# Patient Record
Sex: Male | Born: 1960
Health system: Southern US, Community
[De-identification: ages and names within clinical notes are randomized; demographics above are authoritative.]

## PROBLEM LIST (undated history)

## (undated) DIAGNOSIS — Z720 Tobacco use: Secondary | ICD-10-CM

## (undated) DIAGNOSIS — I2510 Atherosclerotic heart disease of native coronary artery without angina pectoris: Secondary | ICD-10-CM

## (undated) DIAGNOSIS — K219 Gastro-esophageal reflux disease without esophagitis: Secondary | ICD-10-CM

## (undated) DIAGNOSIS — I119 Hypertensive heart disease without heart failure: Secondary | ICD-10-CM

## (undated) HISTORY — PX: BACK SURGERY: SHX140

## (undated) HISTORY — PX: LUMBAR DISC SURGERY: SHX700

---

## 2008-04-15 ENCOUNTER — Ambulatory Visit (HOSPITAL_COMMUNITY): Admission: RE | Admit: 2008-04-15 | Discharge: 2008-04-15 | Payer: Self-pay | Admitting: Internal Medicine

## 2015-08-03 ENCOUNTER — Ambulatory Visit (INDEPENDENT_AMBULATORY_CARE_PROVIDER_SITE_OTHER): Payer: 59 | Admitting: Internal Medicine

## 2015-08-03 ENCOUNTER — Encounter: Payer: Self-pay | Admitting: Cardiology

## 2015-08-03 ENCOUNTER — Encounter: Payer: Self-pay | Admitting: Internal Medicine

## 2015-08-03 VITALS — BP 152/90 | HR 64 | Temp 97.7°F | Resp 16 | Ht 76.0 in | Wt 188.4 lb

## 2015-08-03 DIAGNOSIS — R079 Chest pain, unspecified: Secondary | ICD-10-CM | POA: Diagnosis not present

## 2015-08-03 DIAGNOSIS — Z79899 Other long term (current) drug therapy: Secondary | ICD-10-CM | POA: Diagnosis not present

## 2015-08-03 DIAGNOSIS — K219 Gastro-esophageal reflux disease without esophagitis: Secondary | ICD-10-CM

## 2015-08-03 DIAGNOSIS — R7303 Prediabetes: Secondary | ICD-10-CM | POA: Diagnosis not present

## 2015-08-03 DIAGNOSIS — I10 Essential (primary) hypertension: Secondary | ICD-10-CM | POA: Diagnosis not present

## 2015-08-03 DIAGNOSIS — E559 Vitamin D deficiency, unspecified: Secondary | ICD-10-CM

## 2015-08-03 DIAGNOSIS — E785 Hyperlipidemia, unspecified: Secondary | ICD-10-CM | POA: Diagnosis not present

## 2015-08-03 DIAGNOSIS — Z136 Encounter for screening for cardiovascular disorders: Secondary | ICD-10-CM

## 2015-08-03 MED ORDER — ATENOLOL 50 MG PO TABS: ORAL_TABLET | ORAL | Status: DC

## 2015-08-03 MED ORDER — NITROGLYCERIN 0.4 MG SL SUBL: 0.4000 mg | SUBLINGUAL_TABLET | SUBLINGUAL | Status: DC | PRN

## 2015-08-03 MED ORDER — ESOMEPRAZOLE MAGNESIUM 40 MG PO CPDR: DELAYED_RELEASE_CAPSULE | ORAL | Status: DC

## 2015-08-03 MED ORDER — PANTOPRAZOLE SODIUM 40 MG PO TBEC: DELAYED_RELEASE_TABLET | ORAL | Status: DC

## 2015-08-03 NOTE — Progress Notes (Signed)
  Subjective:    Patient ID: Corey Collier, male    DOB: 12-07-1960, 55 y.o.   MRN: [id number]  HPI  This nice 55 yo DWM who has been lost to f/u >5+ years returned today for evaluation of CP. 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 He has several concerns including occasional EG indigestion esp w/ recumbency and not relieved with Tums or antacids. Has not tried h2 blockers or PPI's. Also reports last week mowing with a self propelled mower he developed several episodes mid chest discomfort and SOB which prompted him to stop & rest. Denied associated N/V, diaphoresis. Today in his office, he developed aching in his shoulders lifting boxes. He also relates other episodes of aching in his shoulders w/o associated physical activity and had no associated CP,SOB,  N or diaphoresis. He's had no sx's walking at a regular pace. Denies hx/o HTN.   Meds- none  All - None  PMH - Neg   Surg - None   FH- (+) for CVA in [place], Alc Cirrhosis in a deceased brother, colon cancer in another brother and 5 sisters in good health.    Review of Systems  .nedros    Objective:   Physical Exam  BP 152/90 mmHg  Pulse 64  Temp(Src) 97.7 F (36.5 C)  Resp 16  Ht 6\' 4"  (1.93 m)  Wt 188 lb 6.4 oz (85.458 kg)  BMI 22.94 kg/m2  HEENT - Eac's patent. TM's Nl. EOM's full. PERRLA. NasoOroPharynx clear. Neck - supple. Nl Thyroid. Carotids 2+ & No bruits, nodes, JVD Chest - Clear equal BS w/o Rales, rhonchi, wheezes. Cor - Nl HS. RRR w/o sig MGR. PP 1(+). No edema. Abd - No palpable organomegaly, masses or tenderness. BS nl. MS- FROM w/o deformities. Muscle power, tone and bulk Nl. Gait Nl. Neuro - No obvious Cr N abnormalities. Sensory, motor and Cerebellar functions appear Nl w/o focal abnormalities. Psyche - Mental status normal & appropriate.  No delusions, ideations or obvious mood abnormalities.  EKG - NSR - No  scute changes.     Assessment & Plan:   1. Essential hypertension  - Atenolol 50 mg #90 x 1 rf  - EKG 12-Lead - TSH  2. Chest pain, unspecified   - EKG 12-Lead - Troponin I - NITROSTAT 0.4 MG SL tab; Place 1 tablet ) under the tongue every 5  minutes as needed for chest pain. - Ambulatory referral to Cardiology  3. Gastroesophageal reflux disease  - Protonix 40 mg # 30 x 1 rf  4. Hyperlipidemia  - Lipid panel  5. Prediabetes  - Hemoglobin A1c - Insulin, random  6. Vitamin D deficiency  - VITAMIN D 25 Hydroxy   7. Medication management  - CBC with Differential/Platelet - BASIC METABOLIC PANEL WITH GFR - Hepatic function panel - Magnesium   - Discussed with patient possibility of angina and reviewed in detail sx's as well as instructed in use of TNG. Collecting lab data base to establish baseline. Requesting Cardiology consult. Advised patient of sx's to call 911 for. Over 40 minutes of exam, counseling, chart review and high complex critical decision making was performed. Over 30 minutes of exam, counseling, chart review and high complex critical decision making was performed

## 2015-08-03 NOTE — Patient Instructions (Signed)
Angina Pectoris Angina pectoris, often called angina, is extreme discomfort in the chest, neck, or arm. This is caused by a lack of blood in the middle and thickest layer of the heart wall (myocardium). There are four types of angina:  Stable angina. Stable angina usually occurs in episodes of predictable frequency and duration. It is usually brought on by physical activity, stress, or excitement. Stable angina usually lasts a few minutes and can often be relieved by a medicine that you place under your tongue. This medicine is called sublingual nitroglycerin.  Unstable angina. Unstable angina can occur even when you are doing little or no physical activity. It can even occur while you are sleeping or when you are at rest. It can suddenly increase in severity or frequency. It may not be relieved by sublingual nitroglycerin, and it can last up to 30 minutes.  Microvascular angina. This type of angina is caused by a disorder of tiny blood vessels called arterioles. Microvascular angina is more common in women. The pain may be more severe and last longer than other types of angina pectoris.  Prinzmetal or variant angina. This type of angina pectoris is rare and usually occurs when you are doing little or no physical activity. It especially occurs in the early morning hours. CAUSES Atherosclerosis is the cause of angina. This is the buildup of fat and cholesterol (plaque) on the inside of the arteries. Over time, the plaque may narrow or block the artery, and this will lessen blood flow to the heart. Plaque can also become weak and break off within a coronary artery to form a clot and cause a sudden blockage. RISK FACTORS Risk factors common to both men and women include:  High cholesterol levels.  High blood pressure (hypertension).  Tobacco use.  Diabetes.  Family history of angina.  Obesity.  Lack of exercise.  A diet high in saturated fats. Women are at greater risk for angina if they  are:  Over age 9.  Postmenopausal. SYMPTOMS Many people do not experience any symptoms during the early stages of angina. As the condition progresses, symptoms common to both men and women may include:  Chest pain.  The pain can be described as a crushing or squeezing in the chest, or a tightness, pressure, fullness, or heaviness in the chest.  The pain can last more than a few minutes, or it can stop and recur.  Pain in the arms, neck, jaw, or back.  Unexplained heartburn or indigestion.  Shortness of breath.  Nausea.  Sudden cold sweats.  Sudden light-headedness. Many women have chest discomfort and some of the other symptoms. However, women often have different (atypical) symptoms, such as:   Fatigue.  Unexplained feelings of nervousness or anxiety.  Unexplained weakness.  Dizziness or fainting. Sometimes, women may have angina without any symptoms. DIAGNOSIS  Tests to diagnose angina may include:  ECG (electrocardiogram).  Exercise stress test. This looks for signs of blockage when the heart is being exercised.  Pharmacologic stress test. This test looks for signs of blockage when the heart is being stressed with a medicine.  Blood tests.  Coronary angiogram. This is a procedure to look at the coronary arteries to see if there is any blockage. TREATMENT  The treatment of angina may include the following:  Healthy behavioral changes to reduce or control risk factors.  Medicine.  Coronary stenting.A stent helps to keep an artery open.  Coronary angioplasty. This procedure widens a narrowed or blocked artery.  Coronary arterybypass  surgery. This will allow your blood to pass the blockage (bypass) to reach your heart. HOME CARE INSTRUCTIONS   Take medicines only as directed by your health care provider.  Do not take the following medicines unless your health care provider approves:  Nonsteroidal anti-inflammatory drugs (NSAIDs), such as ibuprofen,  naproxen, or celecoxib.  Vitamin supplements that contain vitamin A, vitamin E, or both.  Hormone replacement therapy that contains estrogen with or without progestin.  Manage other health conditions such as hypertension and diabetes as directed by your health care provider.  Follow a heart-healthy diet. A dietitian can help to educate you about healthy food options and changes.  Use healthy cooking methods such as roasting, grilling, broiling, baking, poaching, steaming, or stir-frying. Talk to a dietitian to learn more about healthy cooking methods.  Follow an exercise program approved by your health care provider.  Maintain a healthy weight. Lose weight as approved by your health care provider.  Plan rest periods when fatigued.  Learn to manage stress.  Do not use any tobacco products, including cigarettes, chewing tobacco, or electronic cigarettes. If you need help quitting, ask your health care provider.  If you drink alcohol, and your health care provider approves, limit your alcohol intake to no more than 1 drink per day. One drink equals 12 ounces of beer, 5 ounces of wine, or 1 ounces of hard liquor.  Stop illegal drug use.  Keep all follow-up visits as directed by your health care provider. This is important. SEEK IMMEDIATE MEDICAL CARE IF:   You have pain in your chest, neck, arm, jaw, stomach, or back that lasts more than a few minutes, is recurring, or is unrelieved by taking sublingualnitroglycerin.  You have profuse sweating without cause.  You have unexplained:  Heartburn or indigestion.  Shortness of breath or difficulty breathing.  Nausea or vomiting.  Fatigue.  Feelings of nervousness or anxiety.  Weakness.  Diarrhea.  You have sudden light-headedness or dizziness.  You faint. These symptoms may represent a serious problem that is an emergency. Do not wait to see if the symptoms will go away. Get medical help right away. Call your local  emergency services (911 in the [place] Do not drive yourself to the hospital.   This information is not intended to replace advice given to you by your health care provider. Make sure you discuss any questions you have with your health care provider.   Document Released: 03/12/2005 Document Revised: 04/02/2014 Document Reviewed: 07/14/2013 Elsevier Interactive Patient Education 2016 Else[employer] ++++++++++++++++++++++++++++++++++++++++++++++++++++++++++++++++++++++++++++++++++++++++++++++++++++++++++++++++++++++ Acute Coronary Syndrome Acute coronary syndrome (ACS) is a serious problem in which there is suddenly not enough blood and oxygen supplied to the heart. ACS may mean that one or more of the blood vessels in your heart (coronary arteries) may be blocked. ACS can result in chest pain or a heart attack (myocardial infarction or MI). CAUSES This condition is caused by atherosclerosis, which is the buildup of fat and cholesterol (plaque) on the inside of the arteries. Over time, the plaque may narrow or block the artery, and this will lessen blood flow to the heart. Plaque can also become weak and break off within a coronary artery to form a clot and cause a sudden blockage. RISK FACTORS The risks factors of this condition include:  High cholesterol levels.  High blood pressure (hypertension).  Smoking.  Diabetes.  Age.  Family history of chest pain, heart disease, or stroke.  Lack of exercise. SYMPTOMS The most common signs of this condition  include:  Chest pain, which can be:  A crushing or squeezing in the chest.  A tightness, pressure, fullness, or heaviness in the chest.  Present for more than a few minutes, or it can stop and recur.  Pain in the arms, neck, jaw, or back.  Unexplained heartburn or indigestion.  Shortness of breath.  Nausea.  Sudden cold sweats.  Feeling light-headed or dizzy. Sometimes, this condition has no symptoms. DIAGNOSIS ACS may be  diagnosed through the following tests:  Electrocardiogram (ECG).  Blood tests.  Coronary angiogram. This is a procedure to look at the coronary arteries to see if there is any blockage. TREATMENT Treatment for ACS may include:  Healthy behavioral changes to reduce or control risk factors.  Medicine.  Coronary stenting.A stent helps to keep an artery open.  Coronary angioplasty. This procedure widens a narrowed or blocked artery.  Coronary artery bypass surgery. This will allow your blood to pass the blockage (bypass) to reach your heart. HOME CARE INSTRUCTIONS Eating and Drinking  Follow a heart-healthy diet. A dietitian can you help to educate you about healthy food options and changes.  Use healthy cooking methods such as roasting, grilling, broiling, baking, poaching, steaming, or stir-frying. Talk to a dietitian to learn more about healthy cooking methods. Medicines  Take medicines only as directed by your health care provider.  Do not take the following medicines unless your health care provider approves:  Nonsteroidal anti-inflammatory drugs (NSAIDs), such as ibuprofen, naproxen, or celecoxib.  Vitamin supplements that contain vitamin A, vitamin E, or both.  Hormone replacement therapy that contains estrogen with or without progestin.  Stop illegal drug use. Activities  Follow an exercise program that is approved by your health care provider.  Plan rest periods when you are fatigued. Lifestyle  Do not use any tobacco products, including cigarettes, chewing tobacco, or electronic cigarettes. If you need help quitting, ask your health care provider.  If you drink alcohol, and your health care provider approves, limit your alcohol intake to no more than 1 drink per day. One drink equals 12 ounces of beer, 5 ounces of wine, or 1 ounces of hard liquor.  Learn to manage stress.  Maintain a healthy weight. Lose weight as approved by your health care  provider. General Instructions  Manage other health conditions, such as hypertension and diabetes, as directed by your health care provider.  Keep all follow-up visits as directed by your health care provider. This is important.  Your health care provider may ask you to monitor your blood pressure. A blood pressure reading consists of a higher number over a lower number, such as 110 over 72, written as 110/72. Ideally, your blood pressure should be:  Below 140/90 if you have no other medical conditions.  Below 130/80 if you have diabetes or kidney disease. SEEK IMMEDIATE MEDICAL CARE IF:  You have pain in your chest, neck, arm, jaw, stomach, or back that lasts more than a few minutes, is recurring, or is not relieved by taking medicine under your tongue (sublingual nitroglycerin).  You have profuse sweating without cause.  You have unexplained:  Heartburn or indigestion.  Shortness of breath or difficulty breathing.  Nausea or vomiting.  Fatigue.  Feelings of nervousness or anxiety.  Weakness.  Diarrhea.  You have sudden light-headedness or dizziness.  You faint. These symptoms may represent a serious problem that is an emergency. Do not wait to see if the symptoms will go away. Get medical help right away. Call your local  emergency services (911 in the [place] Do not drive yourself to the clinic or hospital.   +++++++++++++++++++++++++++++++++++++++++++++++++++++++++++++++++++++++++++++++++++++++++++++++ Gastroesophageal Reflux Disease, Adult Normally, food travels down the esophagus and stays in the stomach to be digested. However, when a person has gastroesophageal reflux disease (GERD), food and stomach acid move back up into the esophagus. When this happens, the esophagus becomes sore and inflamed. Over time, GERD can create small holes (ulcers) in the lining of the esophagus.  CAUSES This condition is caused by a problem with the muscle between the esophagus and the  stomach (lower esophageal sphincter, or LES). Normally, the LES muscle closes after food passes through the esophagus to the stomach. When the LES is weakened or abnormal, it does not close properly, and that allows food and stomach acid to go back up into the esophagus. The LES can be weakened by certain dietary substances, medicines, and medical conditions, including:  Tobacco use.  Pregnancy.  Having a hiatal hernia.  Heavy alcohol use.  Certain foods and beverages, such as coffee, chocolate, onions, and peppermint. RISK FACTORS This condition is more likely to develop in:  People who have an increased body weight.  People who have connective tissue disorders.  People who use NSAID medicines. SYMPTOMS Symptoms of this condition include:  Heartburn.  Difficult or painful swallowing.  The feeling of having a lump in the throat.  Abitter taste in the mouth.  Bad breath.  Having a large amount of saliva.  Having an upset or bloated stomach.  Belching.  Chest pain.  Shortness of breath or wheezing.  Ongoing (chronic) cough or a night-time cough.  Wearing away of tooth enamel.  Weight loss. Different conditions can cause chest pain. Make sure to see your health care provider if you experience chest pain. DIAGNOSIS Your health care provider will take a medical history and perform a physical exam. To determine if you have mild or severe GERD, your health care provider may also monitor how you respond to treatment. You may also have other tests, including:  An endoscopy toexamine your stomach and esophagus with a small camera.  A test thatmeasures the acidity level in your esophagus.  A test thatmeasures how much pressure is on your esophagus.  A barium swallow or modified barium swallow to show the shape, size, and functioning of your esophagus. TREATMENT The goal of treatment is to help relieve your symptoms and to prevent complications. Treatment for this  condition may vary depending on how severe your symptoms are. Your health care provider may recommend:  Changes to your diet.  Medicine.  Surgery. HOME CARE INSTRUCTIONS Diet  Follow a diet as recommended by your health care provider. This may involve avoiding foods and drinks such as:  Coffee and tea (with or without caffeine).  Drinks that containalcohol.  Energy drinks and sports drinks.  Carbonated drinks or sodas.  Chocolate and cocoa.  Peppermint and mint flavorings.  Garlic and onions.  Horseradish.  Spicy and acidic foods, including peppers, chili powder, curry powder, vinegar, hot sauces, and barbecue sauce.  Citrus fruit juices and citrus fruits, such as oranges, lemons, and limes.  Tomato-based foods, such as red sauce, chili, salsa, and pizza with red sauce.  Fried and fatty foods, such as donuts, french fries, potato chips, and high-fat dressings.  High-fat meats, such as hot dogs and fatty cuts of red and white meats, such as rib eye steak, sausage, ham, and bacon.  High-fat dairy items, such as whole milk, butter, and cream  cheese.  Eat small, frequent meals instead of large meals.  Avoid drinking large amounts of liquid with your meals.  Avoid eating meals during the 2-3 hours before bedtime.  Avoid lying down right after you eat.  Do not exercise right after you eat. General Instructions  Pay attention to any changes in your symptoms.  Take over-the-counter and prescription medicines only as told by your health care provider. Do not take aspirin, ibuprofen, or other NSAIDs unless your health care provider told you to do so.  Do not use any tobacco products, including cigarettes, chewing tobacco, and e-cigarettes. If you need help quitting, ask your health care provider.  Wear loose-fitting clothing. Do not wear anything tight around your waist that causes pressure on your abdomen.  Raise (elevate) the head of your bed 6 inches  (15cm).  Try to reduce your stress, such as with yoga or meditation. If you need help reducing stress, ask your health care provider.  If you are overweight, reduce your weight to an amount that is healthy for you. Ask your health care provider for guidance about a safe weight loss goal.  Keep all follow-up visits as told by your health care provider. This is important. SEEK MEDICAL CARE IF:  You have new symptoms.  You have unexplained weight loss.  You have difficulty swallowing, or it hurts to swallow.  You have wheezing or a persistent cough.  Your symptoms do not improve with treatment.  You have a hoarse voice. SEEK IMMEDIATE MEDICAL CARE IF:  You have pain in your arms, neck, jaw, teeth, or back.  You feel sweaty, dizzy, or light-headed.  You have chest pain or shortness of breath.  You vomit and your vomit looks like blood or coffee grounds.  You faint.  Your stool is bloody or black.  You cannot swallow, drink, or eat.   This information is not intended to replace advice given to you by your health care provider. Make sure you discuss any questions you have with your health care provider.   Document Released: 12/20/2004 Document Revised: 12/01/2014 Document Reviewed: 07/07/2014 Elsevier Interactive Patient Education 2016 [employer]. ++++++++++++++++++++++++++++++++++++++++++++++++++++++++++++++++++++++++++++++++++++++++ Food Choices for Gastroesophageal Reflux Disease, Adult When you have gastroesophageal reflux disease (GERD), the foods you eat and your eating habits are very important. Choosing the right foods can help ease the discomfort of GERD. WHAT GENERAL GUIDELINES DO I NEED TO FOLLOW?  Choose fruits, vegetables, whole grains, low-fat dairy products, and low-fat meat, fish, and poultry.  Limit fats such as oils, salad dressings, butter, nuts, and avocado.  Keep a food diary to identify foods that cause symptoms.  Avoid foods that cause reflux.  These may be different for different people.  Eat frequent small meals instead of three large meals each day.  Eat your meals slowly, in a relaxed setting.  Limit fried foods.  Cook foods using methods other than frying.  Avoid drinking alcohol.  Avoid drinking large amounts of liquids with your meals.  Avoid bending over or lying down until 2-3 hours after eating. WHAT FOODS ARE NOT RECOMMENDED? The following are some foods and drinks that may worsen your symptoms: Vegetables Tomatoes. Tomato juice. Tomato and spaghetti sauce. Chili peppers. Onion and garlic. Horseradish. Fruits Oranges, grapefruit, and lemon (fruit and juice). Meats High-fat meats, fish, and poultry. This includes hot dogs, ribs, ham, sausage, salami, and bacon. Dairy Whole milk and chocolate milk. Sour cream. Cream. Butter. Ice cream. Cream cheese.  Beverages Coffee and tea, with or without  caffeine. Carbonated beverages or energy drinks. Condiments Hot sauce. Barbecue sauce.  Sweets/Desserts Chocolate and cocoa. Donuts. Peppermint and spearmint. Fats and Oils High-fat foods, including [place] fries and potato chips. Other Vinegar. Strong spices, such as black pepper, white pepper, red pepper, cayenne, curry powder, cloves, ginger, and chili powder. The items listed above may not be a complete list of foods and beverages to avoid. Contact your dietitian for more information.   This information is not intended to replace advice given to you by your health care provider. Make sure you discuss any questions you have with your health care provider.   Document Released: 03/12/2005 Document Revised: 04/02/2014 Document Reviewed: 01/14/2013 Elsevier Interactive Patient Education [employer].

## 2015-08-03 NOTE — Progress Notes (Deleted)
Patient ID: Corey Collier, male   DOB: 12/21/1960, 55 y.o.   MRN: [id number]

## 2015-08-04 ENCOUNTER — Telehealth: Payer: Self-pay | Admitting: *Deleted

## 2015-08-04 LAB — CBC WITH DIFFERENTIAL/PLATELET
BASOS PCT: 1 %
Basophils Absolute: 59 cells/uL (ref 0–200)
EOS PCT: 4 %
Eosinophils Absolute: 236 cells/uL (ref 15–500)
HCT: 46.4 % (ref 38.5–50.0)
Hemoglobin: 15.7 g/dL (ref 13.2–17.1)
Lymphocytes Relative: 40 %
Lymphs Abs: 2360 cells/uL (ref 850–3900)
MCH: 31.8 pg (ref 27.0–33.0)
MCHC: 33.8 g/dL (ref 32.0–36.0)
MCV: 94.1 fL (ref 80.0–100.0)
MONOS PCT: 7 %
MPV: 10.4 fL (ref 7.5–12.5)
Monocytes Absolute: 413 cells/uL (ref 200–950)
NEUTROS ABS: 2832 {cells}/uL (ref 1500–7800)
Neutrophils Relative %: 48 %
PLATELETS: 207 10*3/uL (ref 140–400)
RBC: 4.93 MIL/uL (ref 4.20–5.80)
RDW: 13.7 % (ref 11.0–15.0)
WBC: 5.9 10*3/uL (ref 3.8–10.8)

## 2015-08-04 LAB — BASIC METABOLIC PANEL WITH GFR
BUN: 11 mg/dL (ref 7–25)
CHLORIDE: 103 mmol/L (ref 98–110)
CO2: 27 mmol/L (ref 20–31)
CREATININE: 0.96 mg/dL (ref 0.70–1.33)
Calcium: 9 mg/dL (ref 8.6–10.3)
GFR, Est African American: >89 mL/min (ref >=60)
GFR, Est Non African American: 89 mL/min (ref >=60)
Glucose, Bld: 87 mg/dL (ref 65–99)
POTASSIUM: 4.4 mmol/L (ref 3.5–5.3)
SODIUM: 137 mmol/L (ref 135–146)

## 2015-08-04 LAB — HEMOGLOBIN A1C
Hgb A1c MFr Bld: 5.5 % (ref <5.7)
Mean Plasma Glucose: 111 mg/dL

## 2015-08-04 LAB — LIPID PANEL
CHOL/HDL RATIO: 2 ratio (ref <=5.0)
Cholesterol: 171 mg/dL (ref 125–200)
HDL: 86 mg/dL (ref >=40)
LDL Cholesterol: 67 mg/dL (ref <130)
Triglycerides: 92 mg/dL (ref <150)
VLDL: 18 mg/dL (ref <30)

## 2015-08-04 LAB — VITAMIN D 25 HYDROXY (VIT D DEFICIENCY, FRACTURES): VIT D 25 HYDROXY: 46 ng/mL (ref 30–100)

## 2015-08-04 LAB — HEPATIC FUNCTION PANEL
ALT: 27 U/L (ref 9–46)
AST: 21 U/L (ref 10–35)
Albumin: 4.4 g/dL (ref 3.6–5.1)
Alkaline Phosphatase: 62 U/L (ref 40–115)
BILIRUBIN DIRECT: 0.1 mg/dL (ref <=0.2)
BILIRUBIN TOTAL: 0.6 mg/dL (ref 0.2–1.2)
Indirect Bilirubin: 0.5 mg/dL (ref 0.2–1.2)
Total Protein: 6.8 g/dL (ref 6.1–8.1)

## 2015-08-04 LAB — TSH: TSH: 0.9 m[IU]/L (ref 0.40–4.50)

## 2015-08-04 LAB — TROPONIN I: TROPONIN I: 0.01 ng/mL (ref <=0.05)

## 2015-08-04 LAB — MAGNESIUM: MAGNESIUM: 2.2 mg/dL (ref 1.5–2.5)

## 2015-08-04 LAB — INSULIN, RANDOM: Insulin: 3.6 u[IU]/mL (ref 2.0–19.6)

## 2015-08-04 NOTE — Telephone Encounter (Signed)
Patient was advised of an appointment with Dr Marlou Porch, cardiologist, for suspension new onset angina on 08/09/2015 at [facility] .

## 2015-08-05 ENCOUNTER — Telehealth: Payer: Self-pay | Admitting: *Deleted

## 2015-08-05 ENCOUNTER — Encounter: Payer: Self-pay | Admitting: *Deleted

## 2015-08-05 NOTE — Telephone Encounter (Signed)
Called patient to get family history and status. He doesn't know much about his family history. He is also thinking that he may need to reschedule his appointment depending on this weekend.

## 2015-08-08 ENCOUNTER — Telehealth: Payer: Self-pay | Admitting: *Deleted

## 2015-08-08 NOTE — Telephone Encounter (Signed)
Patient called and reported the Atenolol 50 mg tablet is causing him to feel very tired.  Per Dr Melford Aase, the patient should take 1/2 tablet(25 mg) until his cardiology appointment on 08/09/2015.  Patient is aware.

## 2015-08-09 ENCOUNTER — Encounter: Payer: Self-pay | Admitting: Cardiology

## 2015-08-09 ENCOUNTER — Ambulatory Visit (INDEPENDENT_AMBULATORY_CARE_PROVIDER_SITE_OTHER): Payer: 59 | Admitting: Cardiology

## 2015-08-09 ENCOUNTER — Encounter: Payer: Self-pay | Admitting: *Deleted

## 2015-08-09 VITALS — BP 132/84 | HR 54 | Ht 76.0 in | Wt 190.4 lb

## 2015-08-09 DIAGNOSIS — R079 Chest pain, unspecified: Secondary | ICD-10-CM | POA: Diagnosis not present

## 2015-08-09 DIAGNOSIS — Z01818 Encounter for other preprocedural examination: Secondary | ICD-10-CM

## 2015-08-09 DIAGNOSIS — Z720 Tobacco use: Secondary | ICD-10-CM | POA: Diagnosis not present

## 2015-08-09 DIAGNOSIS — K219 Gastro-esophageal reflux disease without esophagitis: Secondary | ICD-10-CM

## 2015-08-09 LAB — PROTIME-INR
INR: 1.04 (ref <1.50)
Prothrombin Time: 13.7 seconds (ref 11.6–15.2)

## 2015-08-09 LAB — CBC
HEMATOCRIT: 44.7 % (ref 38.5–50.0)
HEMOGLOBIN: 15.2 g/dL (ref 13.2–17.1)
MCH: 32 pg (ref 27.0–33.0)
MCHC: 34 g/dL (ref 32.0–36.0)
MCV: 94.1 fL (ref 80.0–100.0)
MPV: 10.7 fL (ref 7.5–12.5)
Platelets: 204 10*3/uL (ref 140–400)
RBC: 4.75 MIL/uL (ref 4.20–5.80)
RDW: 13.7 % (ref 11.0–15.0)
WBC: 6.5 10*3/uL (ref 3.8–10.8)

## 2015-08-09 LAB — BASIC METABOLIC PANEL
BUN: 14 mg/dL (ref 7–25)
CO2: 26 mmol/L (ref 20–31)
Calcium: 9.6 mg/dL (ref 8.6–10.3)
Chloride: 104 mmol/L (ref 98–110)
Creat: 1.12 mg/dL (ref 0.70–1.33)
Glucose, Bld: 91 mg/dL (ref 65–99)
POTASSIUM: 4.3 mmol/L (ref 3.5–5.3)
SODIUM: 139 mmol/L (ref 135–146)

## 2015-08-09 MED ORDER — ASPIRIN EC 81 MG PO TBEC: 81.0000 mg | DELAYED_RELEASE_TABLET | Freq: Every day | ORAL | Status: DC

## 2015-08-09 NOTE — Addendum Note (Signed)
Addended by: Candee Furbish C on: 08/09/2015 03:55 PM   Modules accepted: Orders

## 2015-08-09 NOTE — Progress Notes (Signed)
Cardiology Office Note    Date:  08/09/2015   ID:  Corey Collier, DOB Dec 30, 1960, MRN [id number]  PCP:  Alesia Richards, MD  Cardiologist:   Candee Furbish, MD     History of Present Illness:  Corey Collier is a 55 y.o. male here for evaluation of chest pain at the request of Dr. Melford Aase. Family history is positive for stroke in mother but no early family history of coronary artery disease. ++smoker. No prior cardiac history.  He's noticed several episodes of mid chest discomfort with associated shortness of breath while mowing lawns with a self-propelled mower that prompted him to stop and rest. No diaphoresis, no nausea, no vomiting. He noted previously that he had aching in his shoulders after lifting boxes. Nothing too strenuous triggers, SOB, CP, lightheaded. Doesn't even want to talk during this. Not convinced it is heart. When laying down can see heart beating hard.  EKG From 08/03/15 demonstrates concerning ischemic changes in V3 through V6 as well as aVF.Marland Kitchen Troponin was drawn and was normal. LDL 67. Hemoglobin 15.7.  He has noted that atenolol is made him feel tired.  Had cardiac cath 25 years ago. Pleurisy Diagnosed.    No past medical history on file.  Past Surgical History  Procedure Laterality Date  . Back surgery      Current Medications: Outpatient Prescriptions Prior to Visit  Medication Sig Dispense Refill  . atenolol (TENORMIN) 50 MG tablet Take 1 tablet daily for BP 90 tablet 1  . nitroGLYCERIN (NITROSTAT) 0.4 MG SL tablet Place 1 tablet (0.4 mg total) under the tongue every 5 (five) minutes as needed for chest pain. 25 tablet 12  . pantoprazole (PROTONIX) 40 MG tablet Take 1 tablet daily for indigestion & heartburn 30 tablet 5   No facility-administered medications prior to visit.     Allergies:   Review of patient's allergies indicates no known allergies.   Social History   Social History  . Marital Status: Legally Separated    Spouse Name:  N/A  . Number of Children: N/A  . Years of Education: N/A   Social History Main Topics  . Smoking status: Current Every Day Smoker -- 1.50 packs/day    Types: Cigarettes  . Smokeless tobacco: Not on file  . Alcohol Use: 0.0 oz/week    0 Standard drinks or equivalent per week     Comment: drinks 3-4 fifths of liquor a week  . Drug Use: Not on file  . Sexual Activity: Not on file   Other Topics Concern  . Not on file   Social History Narrative     Family History:  The patient's family history includes Stroke in his father.   ROS:   Please see the history of present illness.    ROS All other systems reviewed and are negative.   PHYSICAL EXAM:   VS:  BP 132/84 mmHg  Pulse 54  Ht 6\' 4"  (1.93 m)  Wt 190 lb 6.4 oz (86.365 kg)  BMI 23.19 kg/m2   GEN: Well nourished, well developed, in no acute distressTall, smells of smoke HEENT: normal Neck: no JVD, carotid bruits, or masses Cardiac: RRR; no murmurs, rubs, or gallops,no edema  Respiratory:  clear to auscultation bilaterally, normal work of breathing GI: soft, nontender, nondistended, + BS MS: no deformity or atrophy Skin: warm and dry, no rash, 2+ radial artery Neuro:  Alert and Oriented x 3, Strength and sensation are intact Psych: euthymic mood, full affect  Wt Readings from  Last 3 Encounters:  08/09/15 190 lb 6.4 oz (86.365 kg)  08/03/15 188 lb 6.4 oz (85.458 kg)      Studies/Labs Reviewed:   EKG:  EKG reviewed from 08/03/15-sinus rhythm, 60 with T-wave inversion in V3 through V6 as well as 3 and aVF. Biphasic in V3. T-wave changes are concerning for ischemia.  Recent Labs: 08/03/2015: ALT 27; BUN 11; Creat 0.96; Hemoglobin 15.7; Magnesium 2.2; Platelets 207; Potassium 4.4; Sodium 137; TSH 0.90   Lipid Panel    Component Value Date/Time   CHOL 171 08/03/2015 1715   TRIG 92 08/03/2015 1715   HDL 86 08/03/2015 1715   CHOLHDL 2.0 08/03/2015 1715   VLDL 18 08/03/2015 1715   [place] 67 08/03/2015 1715     Additional studies/ records that were reviewed today include:  Prior office notes reviewed, lab work reviewed, EKG reviewed    ASSESSMENT:    1. Chest pain, unspecified chest pain type   2. Pre-op examination   3. Tobacco use   4. Gastroesophageal reflux disease without esophagitis      PLAN:  In order of problems listed above:  Chest pain/Angina, unstable  - Concerning for angina. Worse with exertion, relieved with rest. Seems quite anxious about this. T-wave inversion on EKG concerning for ischemia. I personally viewed this with him This has been present over the past 2-3 weeks. Aspirin has been prescribed. Nitroglycerin prescribed. We have recommended cardiac catheterization for definitive diagnosis. Risks and benefits including stroke, heart attack, death, renal impairment, bleeding have been discussed. Radial artery access. He has had prior angiogram over 25 years ago via femoral access.  Tobacco use  - Encourage cessation  GERD  - Minimize alcohol intake  - PPI   Medication Adjustments/Labs and Tests Ordered: Current medicines are reviewed at length with the patient today.  Concerns regarding medicines are outlined above.  Medication changes, Labs and Tests ordered today are listed in the Patient Instructions below. Patient Instructions  Medication Instructions:  Please start Asprin 81 mg a day. Continue all other medications as listed.  Labwork: Please have blood work today (BMP,CBC and PT/INR)  Testing/Procedures: Your physician has requested that you have a cardiac catheterization. Cardiac catheterization is used to diagnose and/or treat various heart conditions. Doctors may recommend this procedure for a number of different reasons. The most common reason is to evaluate chest pain. Chest pain can be a symptom of coronary artery disease (CAD), and cardiac catheterization can show whether plaque is narrowing or blocking your heart's arteries. This procedure is  also used to evaluate the valves, as well as measure the blood flow and oxygen levels in different parts of your heart. For further information please visit HugeFiesta.[email or web address]. Please follow instruction sheet, as given.  Follow-Up: Follow up approximately 2 weeks after your cardiac cath.  If you need a refill on your cardiac medications before your next appointment, please call your pharmacy.  Thank you for choosing [facility]!!          Signed, Candee Furbish, MD  08/09/2015 3:52 PM    [facility], [place], [place]  [address] Phone: [phone]; Fax: [phone]

## 2015-08-09 NOTE — Patient Instructions (Addendum)
Medication Instructions:  Please start Asprin 81 mg a day. Continue all other medications as listed.  Labwork: Please have blood work today (BMP,CBC and PT/INR)  Testing/Procedures: Your physician has requested that you have a cardiac catheterization. Cardiac catheterization is used to diagnose and/or treat various heart conditions. Doctors may recommend this procedure for a number of different reasons. The most common reason is to evaluate chest pain. Chest pain can be a symptom of coronary artery disease (CAD), and cardiac catheterization can show whether plaque is narrowing or blocking your heart's arteries. This procedure is also used to evaluate the valves, as well as measure the blood flow and oxygen levels in different parts of your heart. For further information please visit HugeFiesta.[email or web address]. Please follow instruction sheet, as given.  Follow-Up: Follow up approximately 2 weeks after your cardiac cath.  If you need a refill on your cardiac medications before your next appointment, please call your pharmacy.  Thank you for choosing [facility]!!

## 2015-08-11 ENCOUNTER — Ambulatory Visit: Payer: Self-pay | Admitting: Internal Medicine

## 2015-08-11 ENCOUNTER — Ambulatory Visit (HOSPITAL_COMMUNITY)
Admission: RE | Admit: 2015-08-11 | Discharge: 2015-08-12 | Disposition: A | Discharge status: Home / Self Care | Payer: 59 | Source: Ambulatory Visit | Attending: Cardiology | Admitting: Cardiology

## 2015-08-11 ENCOUNTER — Encounter (HOSPITAL_COMMUNITY): Payer: Self-pay | Admitting: Cardiology

## 2015-08-11 ENCOUNTER — Encounter (HOSPITAL_COMMUNITY)
Admission: RE | Disposition: A | Discharge status: Home / Self Care | Payer: Self-pay | Source: Ambulatory Visit | Attending: Cardiology

## 2015-08-11 DIAGNOSIS — I25110 Atherosclerotic heart disease of native coronary artery with unstable angina pectoris: Secondary | ICD-10-CM | POA: Diagnosis not present

## 2015-08-11 DIAGNOSIS — I200 Unstable angina: Secondary | ICD-10-CM

## 2015-08-11 DIAGNOSIS — I2584 Coronary atherosclerosis due to calcified coronary lesion: Secondary | ICD-10-CM | POA: Insufficient documentation

## 2015-08-11 DIAGNOSIS — Z79899 Other long term (current) drug therapy: Secondary | ICD-10-CM | POA: Diagnosis not present

## 2015-08-11 DIAGNOSIS — Z955 Presence of coronary angioplasty implant and graft: Secondary | ICD-10-CM

## 2015-08-11 DIAGNOSIS — K219 Gastro-esophageal reflux disease without esophagitis: Secondary | ICD-10-CM | POA: Diagnosis not present

## 2015-08-11 DIAGNOSIS — I119 Hypertensive heart disease without heart failure: Secondary | ICD-10-CM | POA: Insufficient documentation

## 2015-08-11 DIAGNOSIS — R079 Chest pain, unspecified: Secondary | ICD-10-CM

## 2015-08-11 DIAGNOSIS — Z720 Tobacco use: Secondary | ICD-10-CM | POA: Diagnosis present

## 2015-08-11 DIAGNOSIS — F17210 Nicotine dependence, cigarettes, uncomplicated: Secondary | ICD-10-CM | POA: Insufficient documentation

## 2015-08-11 DIAGNOSIS — I2510 Atherosclerotic heart disease of native coronary artery without angina pectoris: Secondary | ICD-10-CM | POA: Diagnosis present

## 2015-08-11 HISTORY — DX: Atherosclerotic heart disease of native coronary artery without angina pectoris: I25.10

## 2015-08-11 HISTORY — DX: Hypertensive heart disease without heart failure: I11.9

## 2015-08-11 HISTORY — DX: Tobacco use: Z72.0

## 2015-08-11 HISTORY — PX: CARDIAC CATHETERIZATION: SHX172

## 2015-08-11 HISTORY — DX: Gastro-esophageal reflux disease without esophagitis: K21.9

## 2015-08-11 LAB — POCT ACTIVATED CLOTTING TIME
Activated Clotting Time: 358 seconds
Activated Clotting Time: 266 seconds

## 2015-08-11 LAB — CBC
HEMATOCRIT: 45.3 % (ref 39.0–52.0)
Hemoglobin: 15.2 g/dL (ref 13.0–17.0)
MCH: 32.6 pg (ref 26.0–34.0)
MCHC: 33.6 g/dL (ref 30.0–36.0)
MCV: 97.2 fL (ref 78.0–100.0)
PLATELETS: 174 10*3/uL (ref 150–400)
RBC: 4.66 MIL/uL (ref 4.22–5.81)
RDW: 13.5 % (ref 11.5–15.5)
WBC: 6.3 10*3/uL (ref 4.0–10.5)

## 2015-08-11 LAB — CREATININE, SERUM
Creatinine, Ser: 1.03 mg/dL (ref 0.61–1.24)
GFR calc non Af Amer: >60 mL/min (ref >60)

## 2015-08-11 SURGERY — LEFT HEART CATH AND CORONARY ANGIOGRAPHY
Anesthesia: LOCAL

## 2015-08-11 MED ORDER — NITROGLYCERIN 0.4 MG SL SUBL: 0.4000 mg | SUBLINGUAL_TABLET | SUBLINGUAL | Status: DC | PRN

## 2015-08-11 MED ORDER — MIDAZOLAM HCL 2 MG/2ML IJ SOLN
INTRAMUSCULAR | Status: AC
  Filled 2015-08-11: qty 2

## 2015-08-11 MED ORDER — HEPARIN SODIUM (PORCINE) 1000 UNIT/ML IJ SOLN
INTRAMUSCULAR | Status: DC | PRN
  Administered 2015-08-11: 2000 [IU] via INTRAVENOUS
  Administered 2015-08-11: 4500 [IU] via INTRAVENOUS
  Administered 2015-08-11: 5000 [IU] via INTRAVENOUS

## 2015-08-11 MED ORDER — PANTOPRAZOLE SODIUM 40 MG PO TBEC
40.0000 mg | DELAYED_RELEASE_TABLET | Freq: Every day | ORAL | Status: DC
  Administered 2015-08-12: 10:00:00 40 mg via ORAL
  Filled 2015-08-11: qty 1

## 2015-08-11 MED ORDER — TIROFIBAN HCL IN NACL 5-0.9 MG/100ML-% IV SOLN
0.1500 ug/kg/min | INTRAVENOUS | Status: AC
  Administered 2015-08-11: 11:00:00 0.15 ug/kg/min via INTRAVENOUS
  Filled 2015-08-11: qty 100

## 2015-08-11 MED ORDER — ASPIRIN 81 MG PO CHEW: 81.0000 mg | CHEWABLE_TABLET | Freq: Every day | ORAL | Status: DC

## 2015-08-11 MED ORDER — TIROFIBAN (AGGRASTAT) BOLUS VIA INFUSION
INTRAVENOUS | Status: DC | PRN
  Administered 2015-08-11: 2155 ug via INTRAVENOUS

## 2015-08-11 MED ORDER — SODIUM CHLORIDE 0.9 % WEIGHT BASED INFUSION: 1.0000 mL/kg/h | INTRAVENOUS | Status: DC

## 2015-08-11 MED ORDER — IOPAMIDOL (ISOVUE-370) INJECTION 76%
INTRAVENOUS | Status: AC
  Filled 2015-08-11: qty 50

## 2015-08-11 MED ORDER — SODIUM CHLORIDE 0.9 % WEIGHT BASED INFUSION
3.0000 mL/kg/h | INTRAVENOUS | Status: DC
  Administered 2015-08-11: 3 mL/kg/h via INTRAVENOUS

## 2015-08-11 MED ORDER — SODIUM CHLORIDE 0.9 % WEIGHT BASED INFUSION: 1.0000 mL/kg/h | INTRAVENOUS | Status: AC

## 2015-08-11 MED ORDER — MIDAZOLAM HCL 2 MG/2ML IJ SOLN
INTRAMUSCULAR | Status: DC | PRN
  Administered 2015-08-11: 1 mg via INTRAVENOUS
  Administered 2015-08-11: 2 mg via INTRAVENOUS
  Administered 2015-08-11 (×4): 1 mg via INTRAVENOUS

## 2015-08-11 MED ORDER — ASPIRIN EC 81 MG PO TBEC
81.0000 mg | DELAYED_RELEASE_TABLET | Freq: Every day | ORAL | Status: DC
  Administered 2015-08-12: 10:00:00 81 mg via ORAL
  Filled 2015-08-11: qty 1

## 2015-08-11 MED ORDER — SODIUM CHLORIDE 0.9% FLUSH: 3.0000 mL | Freq: Two times a day (BID) | INTRAVENOUS | Status: DC

## 2015-08-11 MED ORDER — CLOPIDOGREL BISULFATE 75 MG PO TABS
75.0000 mg | ORAL_TABLET | Freq: Every day | ORAL | Status: DC
  Administered 2015-08-12: 08:00:00 75 mg via ORAL
  Filled 2015-08-11: qty 1

## 2015-08-11 MED ORDER — VERAPAMIL HCL 2.5 MG/ML IV SOLN
INTRAVENOUS | Status: AC
  Filled 2015-08-11: qty 2

## 2015-08-11 MED ORDER — TIROFIBAN HCL IN NACL 5-0.9 MG/100ML-% IV SOLN
INTRAVENOUS | Status: DC | PRN
  Administered 2015-08-11: 0.15 ug/kg/min via INTRAVENOUS

## 2015-08-11 MED ORDER — ASPIRIN 81 MG PO CHEW
CHEWABLE_TABLET | ORAL | Status: AC
  Filled 2015-08-11: qty 1

## 2015-08-11 MED ORDER — SODIUM CHLORIDE 0.9% FLUSH: 3.0000 mL | INTRAVENOUS | Status: DC | PRN

## 2015-08-11 MED ORDER — FENTANYL CITRATE (PF) 100 MCG/2ML IJ SOLN
INTRAMUSCULAR | Status: AC
  Filled 2015-08-11: qty 2

## 2015-08-11 MED ORDER — HEPARIN SODIUM (PORCINE) 1000 UNIT/ML IJ SOLN
INTRAMUSCULAR | Status: AC
  Filled 2015-08-11: qty 1

## 2015-08-11 MED ORDER — SODIUM CHLORIDE 0.9 % IV SOLN: 250.0000 mL | INTRAVENOUS | Status: DC | PRN

## 2015-08-11 MED ORDER — IOPAMIDOL (ISOVUE-370) INJECTION 76%
INTRAVENOUS | Status: AC
  Filled 2015-08-11: qty 100

## 2015-08-11 MED ORDER — CLOPIDOGREL BISULFATE 300 MG PO TABS
ORAL_TABLET | ORAL | Status: AC
  Filled 2015-08-11: qty 1

## 2015-08-11 MED ORDER — HEPARIN SODIUM (PORCINE) 5000 UNIT/ML IJ SOLN
5000.0000 [IU] | Freq: Three times a day (TID) | INTRAMUSCULAR | Status: DC
  Administered 2015-08-11 – 2015-08-12 (×2): 5000 [IU] via SUBCUTANEOUS
  Filled 2015-08-11 (×2): qty 1

## 2015-08-11 MED ORDER — HEPARIN (PORCINE) IN NACL 2-0.9 UNIT/ML-% IJ SOLN
INTRAMUSCULAR | Status: AC
  Filled 2015-08-11: qty 1500

## 2015-08-11 MED ORDER — CLOPIDOGREL BISULFATE 300 MG PO TABS
ORAL_TABLET | ORAL | Status: DC | PRN
  Administered 2015-08-11: 600 mg via ORAL

## 2015-08-11 MED ORDER — ASPIRIN 81 MG PO CHEW
81.0000 mg | CHEWABLE_TABLET | ORAL | Status: AC
  Administered 2015-08-11: 81 mg via ORAL

## 2015-08-11 MED ORDER — NITROGLYCERIN 1 MG/10 ML FOR IR/CATH LAB
INTRA_ARTERIAL | Status: DC | PRN
  Administered 2015-08-11: 200 ug via INTRACORONARY
  Administered 2015-08-11: 200 ug via INTRA_ARTERIAL
  Administered 2015-08-11: 200 ug via INTRACORONARY

## 2015-08-11 MED ORDER — ANGIOPLASTY BOOK
Freq: Once | Status: AC
  Administered 2015-08-11: 23:00:00
  Filled 2015-08-11: qty 1

## 2015-08-11 MED ORDER — VERAPAMIL HCL 2.5 MG/ML IV SOLN
INTRAVENOUS | Status: DC | PRN
  Administered 2015-08-11 (×2): via INTRA_ARTERIAL

## 2015-08-11 MED ORDER — ONDANSETRON HCL 4 MG/2ML IJ SOLN: 4.0000 mg | Freq: Four times a day (QID) | INTRAMUSCULAR | Status: DC | PRN

## 2015-08-11 MED ORDER — NITROGLYCERIN 1 MG/10 ML FOR IR/CATH LAB
INTRA_ARTERIAL | Status: AC
  Filled 2015-08-11: qty 10

## 2015-08-11 MED ORDER — LIDOCAINE HCL (PF) 1 % IJ SOLN
INTRAMUSCULAR | Status: DC | PRN
  Administered 2015-08-11: 8 mL via SUBCUTANEOUS

## 2015-08-11 MED ORDER — FENTANYL CITRATE (PF) 100 MCG/2ML IJ SOLN
INTRAMUSCULAR | Status: DC | PRN
  Administered 2015-08-11 (×6): 25 ug via INTRAVENOUS

## 2015-08-11 MED ORDER — SODIUM CHLORIDE 0.9 % IV SOLN
250.0000 mL | INTRAVENOUS | Status: DC | PRN
  Administered 2015-08-11: 500 mL via INTRAVENOUS

## 2015-08-11 MED ORDER — IOPAMIDOL (ISOVUE-370) INJECTION 76%
INTRAVENOUS | Status: DC | PRN
  Administered 2015-08-11: 105 mL via INTRA_ARTERIAL

## 2015-08-11 MED ORDER — HEPARIN (PORCINE) IN NACL 2-0.9 UNIT/ML-% IJ SOLN
INTRAMUSCULAR | Status: DC | PRN
Start: 2015-08-11 — End: 2015-08-11
  Administered 2015-08-11: 1500 mL

## 2015-08-11 MED ORDER — ACETAMINOPHEN 325 MG PO TABS: 650.0000 mg | ORAL_TABLET | ORAL | Status: DC | PRN

## 2015-08-11 MED ORDER — ATENOLOL 50 MG PO TABS: 50.0000 mg | ORAL_TABLET | Freq: Every day | ORAL | Status: DC

## 2015-08-11 MED ORDER — VERAPAMIL HCL 2.5 MG/ML IV SOLN: INTRA_ARTERIAL | Status: DC | PRN

## 2015-08-11 MED ORDER — ATORVASTATIN CALCIUM 10 MG PO TABS
10.0000 mg | ORAL_TABLET | Freq: Every day | ORAL | Status: DC
  Administered 2015-08-11: 17:00:00 10 mg via ORAL
  Filled 2015-08-11: qty 1

## 2015-08-11 MED ORDER — LIDOCAINE HCL (PF) 1 % IJ SOLN
INTRAMUSCULAR | Status: AC
  Filled 2015-08-11: qty 30

## 2015-08-11 MED ORDER — SODIUM CHLORIDE 0.9% FLUSH
3.0000 mL | Freq: Two times a day (BID) | INTRAVENOUS | Status: DC
  Administered 2015-08-11 (×2): 3 mL via INTRAVENOUS

## 2015-08-11 SURGICAL SUPPLY — 23 items
BALLN ANGIOSCULPT RX 2.5X10 (BALLOONS) ×2
BALLN EUPHORA RX 2.5X15 (BALLOONS) ×2
BALLN ~~LOC~~ EMERGE MR 3.5X12 (BALLOONS) ×2
BALLOON ANGIOSCULPT RX 2.5X10 (BALLOONS) IMPLANT
BALLOON EUPHORA RX 2.5X15 (BALLOONS) IMPLANT
BALLOON ~~LOC~~ EMERGE MR 3.5X12 (BALLOONS) IMPLANT
CATH INFINITI 5 FR JL3.5 (CATHETERS) ×1 IMPLANT
CATH INFINITI 5FR ANG PIGTAIL (CATHETERS) ×1 IMPLANT
CATH INFINITI JR4 5F (CATHETERS) ×2 IMPLANT
DEVICE RAD COMP TR BAND LRG (VASCULAR PRODUCTS) ×1 IMPLANT
GLIDESHEATH SLEND SS 6F .021 (SHEATH) ×2 IMPLANT
GUIDE CATH RUNWAY 6FR CLS3 (CATHETERS) ×1 IMPLANT
KIT ENCORE 26 ADVANTAGE (KITS) ×1 IMPLANT
KIT HEART LEFT (KITS) ×2 IMPLANT
PACK CARDIAC CATHETERIZATION (CUSTOM PROCEDURE TRAY) ×2 IMPLANT
STENT SYNERGY DES 2.75X24 (Permanent Stent) ×1 IMPLANT
SYR MEDRAD MARK V 150ML (SYRINGE) ×2 IMPLANT
TRANSDUCER W/STOPCOCK (MISCELLANEOUS) ×2 IMPLANT
TUBING CIL FLEX 10 FLL-RA (TUBING) ×2 IMPLANT
VALVE GUARDIAN II ~~LOC~~ HEMO (MISCELLANEOUS) ×1 IMPLANT
WIRE HI TORQ BMW 190CM (WIRE) ×1 IMPLANT
WIRE HI TORQ VERSACORE-J 145CM (WIRE) ×1 IMPLANT
WIRE SAFE-T 1.5MM-J .035X260CM (WIRE) ×1 IMPLANT

## 2015-08-11 NOTE — Interval H&P Note (Signed)
History and Physical Interval Note:  08/11/2015 7:36 AM  Corey Collier  has presented today for surgery, with the diagnosis of cp, ekg changes  The various methods of treatment have been discussed with the patient and family. After consideration of risks, benefits and other options for treatment, the patient has consented to  Procedure(s): Left Heart Cath and Coronary Angiography (N/A) as a surgical intervention .  The patient's history has been reviewed, patient examined, no change in status, stable for surgery.  I have reviewed the patient's chart and labs.  Questions were answered to the patient's satisfaction.     [employer]

## 2015-08-11 NOTE — H&P (View-Only) (Signed)
Cardiology Office Note    Date:  08/09/2015   ID:  Corey Collier, DOB May 06, 1960, MRN [id number]  PCP:  Alesia Richards, MD  Cardiologist:   Candee Furbish, MD     History of Present Illness:  Corey Collier is a 55 y.o. male here for evaluation of chest pain at the request of Dr. Melford Aase. Family history is positive for stroke in mother but no early family history of coronary artery disease. ++smoker. No prior cardiac history.  He's noticed several episodes of mid chest discomfort with associated shortness of breath while mowing lawns with a self-propelled mower that prompted him to stop and rest. No diaphoresis, no nausea, no vomiting. He noted previously that he had aching in his shoulders after lifting boxes. Nothing too strenuous triggers, SOB, CP, lightheaded. Doesn't even want to talk during this. Not convinced it is heart. When laying down can see heart beating hard.  EKG From 08/03/15 demonstrates concerning ischemic changes in V3 through V6 as well as aVF.Marland Kitchen Troponin was drawn and was normal. LDL 67. Hemoglobin 15.7.  He has noted that atenolol is made him feel tired.  Had cardiac cath 25 years ago. Pleurisy Diagnosed.    No past medical history on file.  Past Surgical History  Procedure Laterality Date  . Back surgery      Current Medications: Outpatient Prescriptions Prior to Visit  Medication Sig Dispense Refill  . atenolol (TENORMIN) 50 MG tablet Take 1 tablet daily for BP 90 tablet 1  . nitroGLYCERIN (NITROSTAT) 0.4 MG SL tablet Place 1 tablet (0.4 mg total) under the tongue every 5 (five) minutes as needed for chest pain. 25 tablet 12  . pantoprazole (PROTONIX) 40 MG tablet Take 1 tablet daily for indigestion & heartburn 30 tablet 5   No facility-administered medications prior to visit.     Allergies:   Review of patient's allergies indicates no known allergies.   Social History   Social History  . Marital Status: Legally Separated    Spouse Name:  N/A  . Number of Children: N/A  . Years of Education: N/A   Social History Main Topics  . Smoking status: Current Every Day Smoker -- 1.50 packs/day    Types: Cigarettes  . Smokeless tobacco: Not on file  . Alcohol Use: 0.0 oz/week    0 Standard drinks or equivalent per week     Comment: drinks 3-4 fifths of liquor a week  . Drug Use: Not on file  . Sexual Activity: Not on file   Other Topics Concern  . Not on file   Social History Narrative     Family History:  The patient's family history includes Stroke in his father.   ROS:   Please see the history of present illness.    ROS All other systems reviewed and are negative.   PHYSICAL EXAM:   VS:  BP 132/84 mmHg  Pulse 54  Ht 6\' 4"  (1.93 m)  Wt 190 lb 6.4 oz (86.365 kg)  BMI 23.19 kg/m2   GEN: Well nourished, well developed, in no acute distressTall, smells of smoke HEENT: normal Neck: no JVD, carotid bruits, or masses Cardiac: RRR; no murmurs, rubs, or gallops,no edema  Respiratory:  clear to auscultation bilaterally, normal work of breathing GI: soft, nontender, nondistended, + BS MS: no deformity or atrophy Skin: warm and dry, no rash, 2+ radial artery Neuro:  Alert and Oriented x 3, Strength and sensation are intact Psych: euthymic mood, full affect  Wt Readings from  Last 3 Encounters:  08/09/15 190 lb 6.4 oz (86.365 kg)  08/03/15 188 lb 6.4 oz (85.458 kg)      Studies/Labs Reviewed:   EKG:  EKG reviewed from 08/03/15-sinus rhythm, 60 with T-wave inversion in V3 through V6 as well as 3 and aVF. Biphasic in V3. T-wave changes are concerning for ischemia.  Recent Labs: 08/03/2015: ALT 27; BUN 11; Creat 0.96; Hemoglobin 15.7; Magnesium 2.2; Platelets 207; Potassium 4.4; Sodium 137; TSH 0.90   Lipid Panel    Component Value Date/Time   CHOL 171 08/03/2015 1715   TRIG 92 08/03/2015 1715   HDL 86 08/03/2015 1715   CHOLHDL 2.0 08/03/2015 1715   VLDL 18 08/03/2015 1715   [place] 67 08/03/2015 1715     Additional studies/ records that were reviewed today include:  Prior office notes reviewed, lab work reviewed, EKG reviewed    ASSESSMENT:    1. Chest pain, unspecified chest pain type   2. Pre-op examination   3. Tobacco use   4. Gastroesophageal reflux disease without esophagitis      PLAN:  In order of problems listed above:  Chest pain/Angina, unstable  - Concerning for angina. Worse with exertion, relieved with rest. Seems quite anxious about this. T-wave inversion on EKG concerning for ischemia. I personally viewed this with him This has been present over the past 2-3 weeks. Aspirin has been prescribed. Nitroglycerin prescribed. We have recommended cardiac catheterization for definitive diagnosis. Risks and benefits including stroke, heart attack, death, renal impairment, bleeding have been discussed. Radial artery access. He has had prior angiogram over 25 years ago via femoral access.  Tobacco use  - Encourage cessation  GERD  - Minimize alcohol intake  - PPI   Medication Adjustments/Labs and Tests Ordered: Current medicines are reviewed at length with the patient today.  Concerns regarding medicines are outlined above.  Medication changes, Labs and Tests ordered today are listed in the Patient Instructions below. Patient Instructions  Medication Instructions:  Please start Asprin 81 mg a day. Continue all other medications as listed.  Labwork: Please have blood work today (BMP,CBC and PT/INR)  Testing/Procedures: Your physician has requested that you have a cardiac catheterization. Cardiac catheterization is used to diagnose and/or treat various heart conditions. Doctors may recommend this procedure for a number of different reasons. The most common reason is to evaluate chest pain. Chest pain can be a symptom of coronary artery disease (CAD), and cardiac catheterization can show whether plaque is narrowing or blocking your heart's arteries. This procedure is  also used to evaluate the valves, as well as measure the blood flow and oxygen levels in different parts of your heart. For further information please visit [email or web address]. Please follow instruction sheet, as given.  Follow-Up: Follow up approximately 2 weeks after your cardiac cath.  If you need a refill on your cardiac medications before your next appointment, please call your pharmacy.  Thank you for choosing [facility]!!          Signed, Candee Furbish, MD  08/09/2015 3:52 PM    [facility] [place], [place], [place]  [address] Phone: [phone]; Fax: [phone]

## 2015-08-11 NOTE — Progress Notes (Signed)
TR BAND REMOVAL  LOCATION:    right radial  DEFLATED PER PROTOCOL:    Yes.    TIME BAND OFF / DRESSING APPLIED:    1315   SITE UPON ARRIVAL:    Level 0  SITE AFTER BAND REMOVAL:    Level 0  CIRCULATION SENSATION AND MOVEMENT:    Within Normal Limits   Yes.    COMMENTS:   Tolerated procedure well 

## 2015-08-12 ENCOUNTER — Encounter (HOSPITAL_COMMUNITY): Payer: Self-pay | Admitting: Nurse Practitioner

## 2015-08-12 DIAGNOSIS — K219 Gastro-esophageal reflux disease without esophagitis: Secondary | ICD-10-CM | POA: Diagnosis not present

## 2015-08-12 DIAGNOSIS — Z720 Tobacco use: Secondary | ICD-10-CM | POA: Diagnosis not present

## 2015-08-12 DIAGNOSIS — I119 Hypertensive heart disease without heart failure: Secondary | ICD-10-CM | POA: Diagnosis not present

## 2015-08-12 DIAGNOSIS — I25110 Atherosclerotic heart disease of native coronary artery with unstable angina pectoris: Secondary | ICD-10-CM | POA: Diagnosis not present

## 2015-08-12 DIAGNOSIS — I2510 Atherosclerotic heart disease of native coronary artery without angina pectoris: Secondary | ICD-10-CM | POA: Diagnosis present

## 2015-08-12 DIAGNOSIS — I2584 Coronary atherosclerosis due to calcified coronary lesion: Secondary | ICD-10-CM | POA: Diagnosis not present

## 2015-08-12 LAB — BASIC METABOLIC PANEL
Anion gap: 8 (ref 5–15)
BUN: 11 mg/dL (ref 6–20)
CO2: 28 mmol/L (ref 22–32)
CREATININE: 1.1 mg/dL (ref 0.61–1.24)
Calcium: 8.9 mg/dL (ref 8.9–10.3)
Chloride: 104 mmol/L (ref 101–111)
GFR calc Af Amer: >60 mL/min (ref >60)
Glucose, Bld: 103 mg/dL — ABNORMAL HIGH (ref 65–99)
Potassium: 4.1 mmol/L (ref 3.5–5.1)
SODIUM: 140 mmol/L (ref 135–145)

## 2015-08-12 LAB — CBC
HCT: 42.9 % (ref 39.0–52.0)
Hemoglobin: 13.9 g/dL (ref 13.0–17.0)
MCH: 31.4 pg (ref 26.0–34.0)
MCHC: 32.4 g/dL (ref 30.0–36.0)
MCV: 97.1 fL (ref 78.0–100.0)
PLATELETS: 146 10*3/uL — AB (ref 150–400)
RBC: 4.42 MIL/uL (ref 4.22–5.81)
RDW: 13.2 % (ref 11.5–15.5)
WBC: 5.4 10*3/uL (ref 4.0–10.5)

## 2015-08-12 MED ORDER — LISINOPRIL 5 MG PO TABS: 5.0000 mg | ORAL_TABLET | Freq: Every day | ORAL | Status: DC

## 2015-08-12 MED ORDER — CLOPIDOGREL BISULFATE 75 MG PO TABS: 75.0000 mg | ORAL_TABLET | Freq: Every day | ORAL | Status: DC

## 2015-08-12 MED ORDER — LISINOPRIL 5 MG PO TABS
5.0000 mg | ORAL_TABLET | Freq: Every day | ORAL | Status: DC
  Administered 2015-08-12: 10:00:00 5 mg via ORAL
  Filled 2015-08-12: qty 1

## 2015-08-12 MED ORDER — ATENOLOL 50 MG PO TABS
25.0000 mg | ORAL_TABLET | Freq: Every day | ORAL | Status: DC
Start: 2015-08-12 — End: 2015-08-12
  Administered 2015-08-12: 10:00:00 25 mg via ORAL
  Filled 2015-08-12: qty 1

## 2015-08-12 MED ORDER — ATORVASTATIN CALCIUM 10 MG PO TABS: 10.0000 mg | ORAL_TABLET | Freq: Every day | ORAL | Status: DC

## 2015-08-12 MED ORDER — ATENOLOL 25 MG PO TABS: 25.0000 mg | ORAL_TABLET | Freq: Every day | ORAL | Status: DC

## 2015-08-12 NOTE — Discharge Summary (Signed)
Discharge Summary    Patient ID: Corey Collier,  MRN: [id number], DOB/AGE: 1961/01/18 55 y.o.  Admit date: 08/11/2015 Discharge date: 08/12/2015  Primary Care Provider: MCKEOWN,WILLIAM DAVID Primary Cardiologist: Jerilynn Mages. Skains, MD   Discharge Diagnoses    Principal Problem:   Unstable angina (HCC)  **s/p PCI/DES to the LAD this admission.  Active Problems:   Coronary artery disease   Hypertensive heart disease   Tobacco abuse   GERD (gastroesophageal reflux disease)  Allergies No Known Allergies  Diagnostic Studies/Procedures    Cardiac Catheterization and Percutaneous Coronary Intervention 5.17.2017  Coronary Findings     Dominance: Right    Left Main  Vessel was injected. Vessel is normal in caliber. Vessel is angiographically normal.      Left Anterior Descending  Vessel was injected. Vessel is normal in caliber. There is severe focal disease in the vessel. Focal mid LAD segment just distal to the bifurcation of major first diagonal branch. 95%      **The LAD was successfully stented using a 2.75 x 24 mm Synergy drug-eluting stent.      Left Circumflex  Vessel was injected. Vessel is normal in caliber. Vessel is angiographically normal.      Right Coronary Artery  Vessel was injected. Vessel is normal in caliber. Vessel is angiographically normal.        _____________   History of Present Illness     54 y/o ? with a h/o HTN and tobacco abuse who was in his usual state of health until approximately 2-3 wks prior to admission, when he began to experience progressive exertional chest pain.  He was seen in cardiology clinic on 5/16 and it was felt that he would require diagnostic catheterization.  Hospital Course     Consultants: None   Mr. Amlin underwent diagnostic catheterization on 08/10/2015, revealing severe mid LAD disease and otherwise normal coronary arteries.  Films were reviewed with the interventional team and he subsequently underwent  successful PCI and stenting of the mid LAD with a 2.75 x 24 mm Synergy DES.  He tolerated the procedure well and post-procedure has been ambulating without recurrent symptoms or limitations. He was noted to be bradycardic with rates frequently dropping into the 40's.  In that setting, his atenolol dose was reduced from 50 mg daily to 25 mg daily.  He was also hypertensive with BP's trending into the 150's and 160's. We thus added lisinopril 5 mg to his regimen as well.  He will be discharged home today in good condition and we have arranged for follow-up on 5/31 with a bmet at that time. _____________  Discharge Vitals Blood pressure 153/80, pulse 52, temperature 97.7 F (36.5 C), temperature source Oral, resp. rate 15, height 6\' 4"  (1.93 m), weight 193 lb 2 oz (87.6 kg), SpO2 100 %.  Filed Weights   08/11/15 0558 08/12/15 0605  Weight: 190 lb (86.183 kg) 193 lb 2 oz (87.6 kg)    Labs & Radiologic Studies    CBC  Recent Labs  08/11/15 1024 08/12/15 0600  WBC 6.3 5.4  HGB 15.2 13.9  HCT 45.3 42.9  MCV 97.2 97.1  PLT 174 [id number]*   Basic Metabolic Panel  Recent Labs  08/09/15 1547 08/11/15 1024 08/12/15 0600  NA 139  --  140  K 4.3  --  4.1  CL 104  --  104  CO2 26  --  28  GLUCOSE 91  --  103*  BUN 14  --  11  CREATININE 1.12 1.03 1.10  CALCIUM 9.6  --  8.9  _____________   Disposition   Pt is being discharged home today in good condition.  Follow-up Plans & Appointments    Follow-up Information    Follow up with Alesia Richards, MD On 08/17/2015.   Specialty:  Internal Medicine   Why:  9:30 AM   Contact information:   [address] [place] [address] [phone]       Follow up with Candee Furbish, MD On 08/24/2015.   Specialty:  Cardiology   Why:  11:15 AM   Contact information:   [id number] [place] [address] [phone]      Discharge Instructions    Amb Referral to Cardiac Rehabilitation    Complete  by:  As directed   Diagnosis:  Coronary Stents           Discharge Medications   Current Discharge Medication List    START taking these medications   Details  atorvastatin (LIPITOR) 10 MG tablet Take 1 tablet (10 mg total) by mouth daily at 6 PM. Qty: 30 tablet, Refills: 6    clopidogrel (PLAVIX) 75 MG tablet Take 1 tablet (75 mg total) by mouth daily with breakfast. Qty: 30 tablet, Refills: 6    lisinopril (PRINIVIL,ZESTRIL) 5 MG tablet Take 1 tablet (5 mg total) by mouth daily. Qty: 30 tablet, Refills: 6      CONTINUE these medications which have CHANGED   Details  atenolol (TENORMIN) 25 MG tablet Take 1 tablet (25 mg total) by mouth daily. Qty: 30 tablet, Refills: 6      CONTINUE these medications which have NOT CHANGED   Details  aspirin EC 81 MG tablet Take 1 tablet (81 mg total) by mouth daily. Qty: 90 tablet, Refills: 3    nitroGLYCERIN (NITROSTAT) 0.4 MG SL tablet Place 1 tablet (0.4 mg total) under the tongue every 5 (five) minutes as needed for chest pain. Qty: 25 tablet, Refills: 12   Associated Diagnoses: Chest pain, unspecified chest pain type    pantoprazole (PROTONIX) 40 MG tablet Take 1 tablet daily for indigestion & heartburn Qty: 30 tablet, Refills: 5          Outstanding Labs/Studies   Follow-up bmet @ office f/u on 5/31 (new ace inhibitor). Follow-up lipids/lft's in 8 wks.  Duration of Discharge Encounter   Greater than 30 minutes including physician time.  Signed, Murray Hodgkins NP 08/12/2015, 9:44 AM

## 2015-08-12 NOTE — Progress Notes (Signed)
Patient Name: Corey Collier Date of Encounter: 08/12/2015  Hospital Problem List     Principal Problem:   Unstable angina [facility]) Active Problems:   Coronary artery disease   Hypertensive heart disease   Tobacco abuse   GERD (gastroesophageal reflux disease)    Subjective   No chest pain or sob.  No right wrist complaints.  Eager to go home.   Inpatient Medications    . aspirin EC  81 mg Oral Daily  . atenolol  50 mg Oral Daily  . atorvastatin  10 mg Oral q1800  . clopidogrel  75 mg Oral Q breakfast  . heparin  5,000 Units Subcutaneous Q8H  . pantoprazole  40 mg Oral Daily  . sodium chloride flush  3 mL Intravenous Q12H    Vital Signs    Filed Vitals:   08/11/15 1200 08/11/15 1605 08/11/15 1907 08/12/15 0605  BP: 161/125 131/99 147/92 145/91  Pulse: 49 49 50 48  Temp: 98 F (36.7 C) 97.7 F (36.5 C) 98 F (36.7 C) 97.6 F (36.4 C)  TempSrc: Oral Oral Oral Oral  Resp: 19 14 18 18   Height:      Weight:    193 lb 2 oz (87.6 kg)  SpO2: 100% 100% 97% 100%    Intake/Output Summary (Last 24 hours) at 08/12/15 0808 Last data filed at 08/12/15 0500  Gross per 24 hour  Intake    600 ml  Output    351 ml  Net    249 ml   Filed Weights   08/11/15 0558 08/12/15 0605  Weight: 190 lb (86.183 kg) 193 lb 2 oz (87.6 kg)    Physical Exam    General: Pleasant, NAD. Neuro: Alert and oriented X 3. Moves all extremities spontaneously. Psych: Flat affect. HEENT:  Normal  Neck: Supple without bruits or JVD. Lungs:  Resp regular and unlabored, markedly diminished breath sounds throughout. Heart: RRR no s3, s4, or murmurs. Abdomen: Soft, non-tender, non-distended, BS + x 4.  Extremities: No clubbing, cyanosis or edema. DP/PT/Radials 2+ and equal bilaterally.  Labs    CBC  Recent Labs  08/11/15 1024 08/12/15 0600  WBC 6.3 5.4  HGB 15.2 13.9  HCT 45.3 42.9  MCV 97.2 97.1  PLT 174 [id number]*   Basic Metabolic Panel  Recent Labs  08/09/15 1547 08/11/15 1024  08/12/15 0600  NA 139  --  140  K 4.3  --  4.1  CL 104  --  104  CO2 26  --  28  GLUCOSE 91  --  103*  BUN 14  --  11  CREATININE 1.12 1.03 1.10  CALCIUM 9.6  --  8.9    Telemetry    Sinus brady, brief run of atrial tach.  ECG    SB, 46, LAE, no acute st/t changes.  Radiology    No results found.  Assessment & Plan    1.  USA/CAD:  Presented w/ 2-3 wk h/o exertional c/p.  S/p pci/DES to the LAD on 5.18.  No recurrent c/p or dyspnea.  Ambulating w/o difficulty.  Cardiac rehab to see this AM.  Cont asa, statin,  blocker, plavix.  Early f/u in 1 wk.  2.  Hypertensive Heart Dzs:  BP variable since admission - trending 130's to 160's.  HR's trending in 40's to 50's.  Says he was only taking 1/2 of an atenolol @ home (25 mg) ordered 50 mg here.  Reduce back to 25 mg.  Suspect he will  require an additional agent.  Add acei and f/u bmet next week.  3.  HL:  On lipitor 10 w/ LDL of 67.  Nl lft's.  4.  Tobacco Abuse:  Complete cessation advised.  Says he has quit before and realizes he needs to try again.    Signed, Murray Hodgkins NP  I have seen and examined the patient along with Murray Hodgkins, NP.  I have reviewed the chart, notes and new data.  I agree with NP's note.  Key new complaints: feels well this morning; no angina and no problems at access site Key examination changes: no hematoma/ecchymosis R wrist, no rales/gallop/JVD  PLAN: Strongly reinforced need for minimum of 6 months DAPT and smoking cessation. Agree with changes to BP meds.  Sanda Klein, MD, [place] [phone] 08/12/2015, 8:48 AM

## 2015-08-12 NOTE — Discharge Instructions (Signed)

## 2015-08-12 NOTE — Progress Notes (Signed)
CARDIAC REHAB PHASE I   PRE:  Rate/Rhythm: 55 SB  BP:  Supine: 153/80  Sitting:   Standing:    SaO2:   MODE:  Ambulation: 800 ft   POST:  Rate/Rhythm: 64 SR  BP:  Supine:   Sitting: 166/83  Standing:    SaO2:  0805-0917 Pt walked 800 ft with slow steady gait. No CP. Tolerated well. Education completed with pt who voiced understanding. Stressed importance of plavix with stent. Discussed smoking cessation and gave handout. Gave fake cigarette and encouraged pt to think of plan for quitting. Discussed CRP 2 and will refer to [place]. Pt seemed a little overwhelmed with information but willing to try to make changes. Discussed heart healthy diet and gave choices for taking lunch. Pt states he usually just grabs everything.   Graylon Good, RN BSN  08/12/2015 9:13 AM

## 2015-08-17 ENCOUNTER — Ambulatory Visit: Payer: Self-pay | Admitting: Internal Medicine

## 2015-08-24 ENCOUNTER — Ambulatory Visit (INDEPENDENT_AMBULATORY_CARE_PROVIDER_SITE_OTHER): Payer: 59 | Admitting: Cardiology

## 2015-08-24 ENCOUNTER — Encounter: Payer: Self-pay | Admitting: Cardiology

## 2015-08-24 VITALS — BP 114/68 | HR 50 | Ht 76.0 in | Wt 186.4 lb

## 2015-08-24 DIAGNOSIS — K219 Gastro-esophageal reflux disease without esophagitis: Secondary | ICD-10-CM | POA: Diagnosis not present

## 2015-08-24 DIAGNOSIS — I2510 Atherosclerotic heart disease of native coronary artery without angina pectoris: Secondary | ICD-10-CM | POA: Diagnosis not present

## 2015-08-24 DIAGNOSIS — Z720 Tobacco use: Secondary | ICD-10-CM | POA: Diagnosis not present

## 2015-08-24 DIAGNOSIS — I2583 Coronary atherosclerosis due to lipid rich plaque: Principal | ICD-10-CM

## 2015-08-24 MED ORDER — ATORVASTATIN CALCIUM 40 MG PO TABS: 40.0000 mg | ORAL_TABLET | Freq: Every day | ORAL | Status: DC

## 2015-08-24 MED ORDER — ATENOLOL 25 MG PO TABS: 12.5000 mg | ORAL_TABLET | Freq: Every day | ORAL | Status: DC

## 2015-08-24 NOTE — Patient Instructions (Addendum)
**Note De-Identified Corey Collier Obfuscation** Medication Instructions:  Increase Atorvastatin to 40 mg daily and decrease Atenolol to 12.5 mg daily-All other medications remain the same.  Labwork: None  Testing/Procedures: None  Follow-Up: Your physician wants you to follow-up in: 4 months. You will receive a reminder letter in the mail two months in advance. If you don't receive a letter, please call our office to schedule the follow-up appointment.     If you need a refill on your cardiac medications before your next appointment, please call your pharmacy.

## 2015-08-24 NOTE — Progress Notes (Signed)
Cardiology Office Note    Date:  08/24/2015   ID:  Corey Collier, DOB 02-23-1961, MRN [id number]  PCP:  Alesia Richards, MD  Cardiologist:   Candee Furbish, MD     History of Present Illness:  Corey Collier is a 55 y.o. male here for Follow-up of coronary artery disease status post mid LAD 90% lesion stented in mid May 2017. He has been doing well since then. No active chest pain. He still feeling some pain in his legs with walking and I described that we may further evaluate in the future with lower extremity Dopplers.   No significant shortness of breath.  To quote original consultative visit:  Previous evaluation of chest pain at the request of Dr. Melford Aase. Family history is positive for stroke in mother but no early family history of coronary artery disease. ++smoker. No prior cardiac history.  He's noticed several episodes of mid chest discomfort with associated shortness of breath while mowing lawns with a self-propelled mower that prompted him to stop and rest. No diaphoresis, no nausea, no vomiting. He noted previously that he had aching in his shoulders after lifting boxes. Nothing too strenuous triggers, SOB, CP, lightheaded. Doesn't even want to talk during this. Not convinced it is heart. When laying down can see heart beating hard.  EKG From 08/03/15 demonstrates concerning ischemic changes in V3 through V6 as well as aVF.Marland Kitchen Troponin was drawn and was normal. LDL 67. Hemoglobin 15.7.  He has noted that atenolol is made him feel tired. We cut this back to 12.5 mg a day. Heart rate was 50.  Had cardiac cath 25 years ago. Pleurisy Diagnosed.    Past Medical History  Diagnosis Date  . Coronary artery disease     a. Cath ~ 25 yrs ago, dx w/ pleurisy;  b. 07/2015 Cath/PCI: LM nl, LAD 26m (2.75x24 Synergy DES), LCX nl, RCA nl.  Marland Kitchen GERD (gastroesophageal reflux disease)   . Tobacco abuse   . Hypertensive heart disease     Past Surgical History  Procedure Laterality  Date  . Back surgery    . Lumbar disc surgery    . Cardiac catheterization N/A 08/11/2015    Procedure: Left Heart Cath and Coronary Angiography;  Surgeon: Jerline Pain, MD;  Location: [facility];  Service: Cardiovascular;  Laterality: N/A;  . Cardiac catheterization N/A 08/11/2015    Procedure: Coronary Stent Intervention;  Surgeon: Jerline Pain, MD;  Location: [facility];  Service: Cardiovascular;  Laterality: N/A;  . Cardiac catheterization N/A 08/11/2015    Procedure: Coronary Stent Intervention;  Surgeon: Jettie Booze, MD;  Location: [facility];  Service: Cardiovascular;  Laterality: N/A;    Current Medications: Outpatient Prescriptions Prior to Visit  Medication Sig Dispense Refill  . aspirin EC 81 MG tablet Take 1 tablet (81 mg total) by mouth daily. 90 tablet 3  . clopidogrel (PLAVIX) 75 MG tablet Take 1 tablet (75 mg total) by mouth daily with breakfast. 30 tablet 6  . lisinopril (PRINIVIL,ZESTRIL) 5 MG tablet Take 1 tablet (5 mg total) by mouth daily. 30 tablet 6  . nitroGLYCERIN (NITROSTAT) 0.4 MG SL tablet Place 1 tablet (0.4 mg total) under the tongue every 5 (five) minutes as needed for chest pain. 25 tablet 12  . atenolol (TENORMIN) 25 MG tablet Take 1 tablet (25 mg total) by mouth daily. 30 tablet 6  . atorvastatin (LIPITOR) 10 MG tablet Take 1 tablet (10 mg total) by mouth  daily at 6 PM. 30 tablet 6  . pantoprazole (PROTONIX) 40 MG tablet Take 1 tablet daily for indigestion & heartburn (Patient taking differently: Take 40 mg by mouth daily. ) 30 tablet 5   No facility-administered medications prior to visit.     Allergies:   Review of patient's allergies indicates no known allergies.   Social History   Social History  . Marital Status: Legally Separated    Spouse Name: N/A  . Number of Children: N/A  . Years of Education: N/A   Social History Main Topics  . Smoking status: Current Every Day Smoker -- 1.50 packs/day for 37 years     Types: Cigarettes  . Smokeless tobacco: Never Used  . Alcohol Use: 24.0 oz/week    0 Standard drinks or equivalent, 40 Shots of liquor per week     Comment: 08/11/2015 drinks 3-4 fifths of liquor a week  . Drug Use: No  . Sexual Activity: Not Asked   Other Topics Concern  . None   Social History Narrative     Family History:  The patient's family history includes Stroke in his father.   ROS:   Please see the history of present illness.    ROS All other systems reviewed and are negative.   PHYSICAL EXAM:   VS:  BP 114/68 mmHg  Pulse 50  Ht 6\' 4"  (1.93 m)  Wt 186 lb 6.4 oz (84.55 kg)  BMI 22.70 kg/m2   GEN: Well nourished, well developed, in no acute distressTall, smells of smoke HEENT: normal Neck: no JVD, carotid bruits, or masses Cardiac: RRR; no murmurs, rubs, or gallops,no edema  Respiratory:  clear to auscultation bilaterally, normal work of breathing GI: soft, nontender, nondistended, + BS MS: no deformity or atrophy Skin: warm and dry, no rash, 2+ radial artery Neuro:  Alert and Oriented x 3, Strength and sensation are intact Psych: euthymic mood, full affect  Wt Readings from Last 3 Encounters:  08/24/15 186 lb 6.4 oz (84.55 kg)  08/12/15 193 lb 2 oz (87.6 kg)  08/09/15 190 lb 6.4 oz (86.365 kg)      Studies/Labs Reviewed:   EKG:  EKG reviewed from 08/03/15-sinus rhythm, 60 with T-wave inversion in V3 through V6 as well as 3 and aVF. Biphasic in V3. T-wave changes are concerning for ischemia.  Recent Labs: 08/03/2015: ALT 27; Magnesium 2.2; TSH 0.90 08/12/2015: BUN 11; Creatinine, Ser 1.10; Hemoglobin 13.9; Platelets 146*; Potassium 4.1; Sodium 140   Lipid Panel    Component Value Date/Time   CHOL 171 08/03/2015 1715   TRIG 92 08/03/2015 1715   HDL 86 08/03/2015 1715   CHOLHDL 2.0 08/03/2015 1715   VLDL 18 08/03/2015 1715   LDLCALC 67 08/03/2015 1715    Additional studies/ records that were reviewed today include:  Prior office notes reviewed, lab  work reviewed, EKG reviewed    ASSESSMENT:    1. Coronary artery disease due to lipid rich plaque   2. Tobacco use   3. Gastroesophageal reflux disease without esophagitis      PLAN:  In order of problems listed above:  Chest pain/Angina/Coronary artery disease-Cardia catheterization 08/11/15   - Mid LAD lesion, 90% stenosed. Post intervention with a 2.75 x 24 Synergy drug-eluting stent, postdilated to 3.5 mm there is a 0% residual stenosis.  LVEDP 20 mmHg.  I stressed the importance of dual antiplatelet therapy and aggressive secondary prevention.  He is concerned about hospitalization cost. Asked him to discuss this with her front  desk. Encourage cardiac rehabilitation.  Tobacco use  - Encourage cessation  GERD  - Minimize alcohol intake  - PPI  Bradycardia-we will decrease his atenolol to 12.5 g a day.  Hyperlipidemia-we will increase his atorvastatin from 10 mg up to 40 mg for further plaque stabilization, high intensity statin.  Worried about financial issues. Asked him to discuss with financial counseling.     Medication Adjustments/Labs and Tests Ordered: Current medicines are reviewed at length with the patient today.  Concerns regarding medicines are outlined above.  Medication changes, Labs and Tests ordered today are listed in the Patient Instructions below. Patient Instructions  Medication Instructions:  Increase Atorvastatin to 40 mg daily and decrease Atenolol to 12.5 mg daily-All other medications remain the same.  Labwork: None  Testing/Procedures: None  Follow-Up: Your physician wants you to follow-up in: 4 months. You will receive a reminder letter in the mail two months in advance. If you don't receive a letter, please call our office to schedule the follow-up appointment.     If you need a refill on your cardiac medications before your next appointment, please call your pharmacy.       Signed, Candee Furbish, MD  08/24/2015 12:21 PM     [facility] [place] [place], [place], [place]  [address] Phone: [phone]; Fax: [phone]

## 2015-08-25 ENCOUNTER — Telehealth: Payer: Self-pay | Admitting: *Deleted

## 2015-08-25 NOTE — Telephone Encounter (Signed)
Atenolol is not made in a 12.5 mg tablet.  He should either use a pill cutter or have pharmacy cut them for him.

## 2015-08-25 NOTE — Telephone Encounter (Signed)
called pt back with the answer that atenolol does not come in a 12.5 mg, use a pill cutter or have pharmacy cut them for him, LVM regarding this.

## 2015-08-25 NOTE — Telephone Encounter (Signed)
does atenolol come in a 12.5 mg? if so they pt wants to get that RX, he said they are not breaking in half, they are more crumbling, please advise.

## 2015-09-15 ENCOUNTER — Telehealth: Payer: Self-pay | Admitting: Cardiology

## 2015-09-15 NOTE — Telephone Encounter (Signed)
New message      Pt c/o medication issue:  1. Name of Medication: plavix and aspirin 2. How are you currently taking this medication (dosage and times per day)? Aspirin 81mg  and plavix 75mg   3. Are you having a reaction (difficulty breathing--STAT)? no 4. What is your medication issue?  Is this too much blood thinner?

## 2015-09-15 NOTE — Telephone Encounter (Signed)
Spoke with pt and reviewed medications with him and importance of medications. Advised ok to take ASA and Plavix. Pt verbalized understanding and was appreciative for call back.

## 2016-01-18 ENCOUNTER — Other Ambulatory Visit: Payer: Self-pay | Admitting: Cardiology

## 2016-01-18 ENCOUNTER — Other Ambulatory Visit: Payer: Self-pay | Admitting: Internal Medicine

## 2016-01-18 MED ORDER — ATORVASTATIN CALCIUM 40 MG PO TABS: 40.0000 mg | ORAL_TABLET | Freq: Every day | ORAL | 6 refills | Status: DC

## 2016-01-18 NOTE — Addendum Note (Signed)
Addended by: Derl Barrow on: 01/18/2016 02:45 PM   Modules accepted: Orders

## 2016-01-24 DIAGNOSIS — E559 Vitamin D deficiency, unspecified: Secondary | ICD-10-CM | POA: Insufficient documentation

## 2016-01-24 DIAGNOSIS — I10 Essential (primary) hypertension: Secondary | ICD-10-CM | POA: Insufficient documentation

## 2016-01-24 DIAGNOSIS — E782 Mixed hyperlipidemia: Secondary | ICD-10-CM | POA: Insufficient documentation

## 2016-01-24 NOTE — Progress Notes (Signed)
[facility]   Unk Pinto, M.D.    Uvaldo Bristle. Silverio Lay, P.A.-C      Starlyn Skeans, P.A.-C  [facility]                [address] [place] [id number] Telephone [phone] Telefax [phone] Annual  Screening/Preventative Visit  & Comprehensive Evaluation & Examination     This very nice 55 y.o. DWM presents for a Screening/Preventative Visit & comprehensive evaluation and management of multiple medical co-morbidities.  Patient has been followed for labile HTN, ASCAD,  Prediabetes, Hyperlipidemia and Vitamin D Deficiency. Other problems also include GERD controlled with prudemnt diet & meds.      Patient has long hx/o labile HTN and had been lost to f/u until he represented in May 2017 with ACS/unstable angina subsequently had DES of a 90% mid LAD lesion by Dr Marlou Porch.  Patient's BP has been controlled at home. Today's BP was sl elevated at 144/78 and later rechecked at 140/76. Patient denies any cardiac symptoms as chest pain, palpitations, shortness of breath, dizziness or ankle swelling.     Patient's hyperlipidemia is controlled with diet and medications. Patient denies myalgias or other medication SE's. Last lipids were at goal on Atorvastatin: Lab Results  Component Value Date   CHOL 171 08/03/2015   HDL 86 08/03/2015   LDLCALC 67 08/03/2015   TRIG 92 08/03/2015   CHOLHDL 2.0 08/03/2015      Patient also is screened proactively for prediabetes and patient denies reactive hypoglycemic symptoms, visual blurring, diabetic polys or paresthesias. Last A1c was at goal: Lab Results  Component Value Date   HGBA1C 5.5 08/03/2015       Finally, patient has history of Vitamin D Deficiency of 46 on treatment.   Current Outpatient Prescriptions on File Prior to Visit  Medication Sig  . aspirin EC 81 MG tablet Take 1 tablet (81 mg total) by mouth daily.  Marland Kitchen atenolol (TENORMIN) 25 MG tablet  Take 0.5 tablets (12.5 mg total) by mouth daily.  Marland Kitchen atorvastatin (LIPITOR) 40 MG tablet Take 1 tablet (40 mg total) by mouth daily at 6 PM.  . clopidogrel (PLAVIX) 75 MG tablet Take 1 tablet (75 mg total) by mouth daily with breakfast.  . lisinopril (PRINIVIL,ZESTRIL) 5 MG tablet Take 1 tablet (5 mg total) by mouth daily.  . nitroGLYCERIN (NITROSTAT) 0.4 MG SL tablet Place 1 tablet (0.4 mg total) under the tongue every 5 (five) minutes as needed for chest pain.  . pantoprazole (PROTONIX) 40 MG tablet Take 40 mg by mouth daily.   No current facility-administered medications on file prior to visit.    No Known Allergies Past Medical History:  Diagnosis Date  . Coronary artery disease    a. Cath ~ 25 yrs ago, dx w/ pleurisy;  b. 07/2015 Cath/PCI: LM nl, LAD 43m (2.75x24 Synergy DES), LCX nl, RCA nl.  Marland Kitchen GERD (gastroesophageal reflux disease)   . Hypertensive heart disease   . Tobacco abuse    Health Maintenance  Topic Date Due  . Hepatitis C Screening  [birth year]  . HIV Screening  06/13/1976  . TETANUS/TDAP  06/13/1980  . COLONOSCOPY  06/14/2011  . INFLUENZA VACCINE  10/25/2015   Immunization History  Administered Date(s) Administered  . Tdap 01/25/2016   Past Surgical History:  Procedure Laterality Date  . BACK SURGERY    .  CARDIAC CATHETERIZATION N/A 08/11/2015   Procedure: Left Heart Cath and Coronary Angiography;  Surgeon: Jerline Pain, MD;  Location: [facility];  Service: Cardiovascular;  Laterality: N/A;  . CARDIAC CATHETERIZATION N/A 08/11/2015   Procedure: Coronary Stent Intervention;  Surgeon: Jerline Pain, MD;  Location: [facility];  Service: Cardiovascular;  Laterality: N/A;  . CARDIAC CATHETERIZATION N/A 08/11/2015   Procedure: Coronary Stent Intervention;  Surgeon: Jettie Booze, MD;  Location: [facility];  Service: Cardiovascular;  Laterality: N/A;  . LUMBAR DISC SURGERY     Family History  Problem Relation Age of Onset  . Stroke Father     Social History   Social History  . Marital status: Legally Separated    Spouse name: N/A  . Number of children: N/A  . Years of education: N/A   Occupational History  . Not on file.   Social History Main Topics  . Smoking status: Current Every Day Smoker    Packs/day: 1.50    Years: 37.00    Types: Cigarettes  . Smokeless tobacco: Never Used  . Alcohol use 24.0 oz/week    40 Shots of liquor per week     Comment: 08/11/2015 drinks 3-4 fifths of liquor a week  . Drug use: No  . Sexual activity: Active    ROS Constitutional: Denies fever, chills, weight loss/gain, headaches, insomnia,  night sweats or change in appetite. Does c/o fatigue. Eyes: Denies redness, blurred vision, diplopia, discharge, itchy or watery eyes.  ENT: Denies discharge, congestion, post nasal drip, epistaxis, sore throat, earache, hearing loss, dental pain, Tinnitus, Vertigo, Sinus pain or snoring.  Cardio: Denies chest pain, palpitations, irregular heartbeat, syncope, dyspnea, diaphoresis, orthopnea, PND, claudication or edema Respiratory: denies cough, dyspnea, DOE, pleurisy, hoarseness, laryngitis or wheezing.  Gastrointestinal: Denies dysphagia, heartburn, reflux, water brash, pain, cramps, nausea, vomiting, bloating, diarrhea, constipation, hematemesis, melena, hematochezia, jaundice or hemorrhoids Genitourinary: Denies dysuria, frequency, urgency, nocturia, hesitancy, discharge, hematuria or flank pain Musculoskeletal: Denies arthralgia, myalgia, stiffness, Jt. Swelling, pain, limp or strain/sprain. Denies Falls. Skin: Denies puritis, rash, hives, warts, acne, eczema or change in skin lesion Neuro: No weakness, tremor, incoordination, spasms, paresthesia or pain Psychiatric: Denies confusion, memory loss or sensory loss. Denies Depression. Endocrine: Denies change in weight, skin, hair change, nocturia, and paresthesia, diabetic polys, visual blurring or hyper / hypo glycemic episodes.  Heme/Lymph: No  excessive bleeding, bruising or enlarged lymph nodes.  Physical Exam  BP (!) 144/78   Pulse (!) 56   Temp 97.5 F (36.4 C)   Resp 16   Ht 6' 3.5" (1.918 m)   Wt 185 lb 9.6 oz (84.2 kg)   BMI 22.89 kg/m   General Appearance: Well nourished, in no apparent distress.  Eyes: PERRLA, EOMs, conjunctiva no swelling or erythema, normal fundi and vessels. Sinuses: No frontal/maxillary tenderness ENT/Mouth: EACs patent / TMs  nl. Nares clear without erythema, swelling, mucoid exudates. Oral hygiene is good. No erythema, swelling, or exudate. Tongue normal, non-obstructing. Tonsils not swollen or erythematous. Hearing normal.  Neck: Supple, thyroid normal. No bruits, nodes or JVD. Respiratory: Respiratory effort normal.  BS equal and clear bilateral without rales, rhonci, wheezing or stridor. Cardio: Heart sounds are normal with regular rate and rhythm and no murmurs, rubs or gallops. Peripheral pulses are normal and equal bilaterally without edema. No aortic or femoral bruits. Chest: symmetric with normal excursions and percussion.  Abdomen: Soft, with Nl bowel sounds. Nontender, no guarding, rebound, hernias, masses, or  organomegaly.  Lymphatics: Non tender without lymphadenopathy.  Genitourinary: No hernias.Testes nl. DRE - prostate nl for age - smooth & firm w/o nodules. Musculoskeletal: Full ROM all peripheral extremities, joint stability, 5/5 strength, and normal gait. Skin: Warm and dry without rashes, lesions, cyanosis, clubbing or  ecchymosis.  Neuro: Cranial nerves intact, reflexes equal bilaterally. Normal muscle tone, no cerebellar symptoms. Sensation intact.  Pysch: Alert and oriented X 3 with normal affect, insight and judgment appropriate.   Assessment and Plan  1. Annual Preventative/Screening Exam        Continue prudent diet as discussed, weight control, BP monitoring, regular exercise, and medications as discussed.  Discussed med effects and SE's. Routine screening labs and  tests as requested with regular follow-up as recommended. Over 40 minutes of exam, counseling, chart review and high complex critical decision making was performed

## 2016-01-24 NOTE — Patient Instructions (Signed)

## 2016-01-25 ENCOUNTER — Ambulatory Visit (INDEPENDENT_AMBULATORY_CARE_PROVIDER_SITE_OTHER): Payer: 59 | Admitting: Internal Medicine

## 2016-01-25 ENCOUNTER — Encounter: Payer: Self-pay | Admitting: Internal Medicine

## 2016-01-25 VITALS — BP 144/78 | HR 56 | Temp 97.5°F | Resp 16 | Ht 75.5 in | Wt 185.6 lb

## 2016-01-25 DIAGNOSIS — Z0000 Encounter for general adult medical examination without abnormal findings: Secondary | ICD-10-CM | POA: Diagnosis not present

## 2016-01-25 DIAGNOSIS — I10 Essential (primary) hypertension: Secondary | ICD-10-CM

## 2016-01-25 DIAGNOSIS — Z125 Encounter for screening for malignant neoplasm of prostate: Secondary | ICD-10-CM

## 2016-01-25 DIAGNOSIS — R7303 Prediabetes: Secondary | ICD-10-CM

## 2016-01-25 DIAGNOSIS — Z0001 Encounter for general adult medical examination with abnormal findings: Secondary | ICD-10-CM

## 2016-01-25 DIAGNOSIS — Z23 Encounter for immunization: Secondary | ICD-10-CM

## 2016-01-25 DIAGNOSIS — Z1212 Encounter for screening for malignant neoplasm of rectum: Secondary | ICD-10-CM

## 2016-01-25 DIAGNOSIS — Z136 Encounter for screening for cardiovascular disorders: Secondary | ICD-10-CM | POA: Diagnosis not present

## 2016-01-25 DIAGNOSIS — E782 Mixed hyperlipidemia: Secondary | ICD-10-CM

## 2016-01-25 DIAGNOSIS — Z1211 Encounter for screening for malignant neoplasm of colon: Secondary | ICD-10-CM

## 2016-01-25 DIAGNOSIS — Z79899 Other long term (current) drug therapy: Secondary | ICD-10-CM

## 2016-01-25 DIAGNOSIS — I2510 Atherosclerotic heart disease of native coronary artery without angina pectoris: Secondary | ICD-10-CM

## 2016-01-25 DIAGNOSIS — R5383 Other fatigue: Secondary | ICD-10-CM

## 2016-01-25 DIAGNOSIS — E559 Vitamin D deficiency, unspecified: Secondary | ICD-10-CM

## 2016-01-25 LAB — HEPATIC FUNCTION PANEL
ALT: 25 U/L (ref 9–46)
AST: 21 U/L (ref 10–35)
Albumin: 4.5 g/dL (ref 3.6–5.1)
Alkaline Phosphatase: 72 U/L (ref 40–115)
BILIRUBIN DIRECT: 0.2 mg/dL (ref <=0.2)
BILIRUBIN TOTAL: 0.7 mg/dL (ref 0.2–1.2)
Indirect Bilirubin: 0.5 mg/dL (ref 0.2–1.2)
Total Protein: 7 g/dL (ref 6.1–8.1)

## 2016-01-25 LAB — BASIC METABOLIC PANEL WITH GFR
BUN: 10 mg/dL (ref 7–25)
CALCIUM: 9.7 mg/dL (ref 8.6–10.3)
CHLORIDE: 103 mmol/L (ref 98–110)
CO2: 24 mmol/L (ref 20–31)
CREATININE: 1.1 mg/dL (ref 0.70–1.33)
GFR, Est African American: 88 mL/min (ref >=60)
GFR, Est Non African American: 76 mL/min (ref >=60)
Glucose, Bld: 124 mg/dL — ABNORMAL HIGH (ref 65–99)
Potassium: 4.2 mmol/L (ref 3.5–5.3)
SODIUM: 140 mmol/L (ref 135–146)

## 2016-01-25 LAB — CBC WITH DIFFERENTIAL/PLATELET
BASOS ABS: 0 {cells}/uL (ref 0–200)
BASOS PCT: 0 %
EOS PCT: 2 %
Eosinophils Absolute: 110 cells/uL (ref 15–500)
HCT: 45.4 % (ref 38.5–50.0)
Hemoglobin: 15 g/dL (ref 13.2–17.1)
Lymphocytes Relative: 26 %
Lymphs Abs: 1430 cells/uL (ref 850–3900)
MCH: 32.5 pg (ref 27.0–33.0)
MCHC: 33 g/dL (ref 32.0–36.0)
MCV: 98.3 fL (ref 80.0–100.0)
MONOS PCT: 6 %
MPV: 10.8 fL (ref 7.5–12.5)
Monocytes Absolute: 330 cells/uL (ref 200–950)
NEUTROS ABS: 3630 {cells}/uL (ref 1500–7800)
Neutrophils Relative %: 66 %
PLATELETS: 195 10*3/uL (ref 140–400)
RBC: 4.62 MIL/uL (ref 4.20–5.80)
RDW: 13.7 % (ref 11.0–15.0)
WBC: 5.5 10*3/uL (ref 3.8–10.8)

## 2016-01-25 LAB — LIPID PANEL
CHOL/HDL RATIO: 1.6 ratio (ref <=5.0)
CHOLESTEROL: 134 mg/dL (ref 125–200)
HDL: 86 mg/dL (ref >=40)
LDL Cholesterol: 34 mg/dL (ref <130)
TRIGLYCERIDES: 72 mg/dL (ref <150)
VLDL: 14 mg/dL (ref <30)

## 2016-01-25 LAB — VITAMIN B12: Vitamin B-12: 261 pg/mL (ref 200–1100)

## 2016-01-25 LAB — PSA: PSA: 1.6 ng/mL (ref <=4.0)

## 2016-01-25 LAB — TSH: TSH: 0.47 mIU/L (ref 0.40–4.50)

## 2016-01-26 ENCOUNTER — Other Ambulatory Visit: Payer: Self-pay | Admitting: Internal Medicine

## 2016-01-26 DIAGNOSIS — E782 Mixed hyperlipidemia: Secondary | ICD-10-CM

## 2016-01-26 LAB — IRON AND TIBC
%SAT: 23 % (ref 15–60)
Iron: 72 ug/dL (ref 50–180)
TIBC: 307 ug/dL (ref 250–425)
UIBC: 235 ug/dL (ref 125–400)

## 2016-01-26 LAB — VITAMIN D 25 HYDROXY (VIT D DEFICIENCY, FRACTURES): VIT D 25 HYDROXY: 45 ng/mL (ref 30–100)

## 2016-01-26 LAB — URINALYSIS, ROUTINE W REFLEX MICROSCOPIC
BILIRUBIN URINE: NEGATIVE
GLUCOSE, UA: NEGATIVE
HGB URINE DIPSTICK: NEGATIVE
KETONES UR: NEGATIVE
Leukocytes, UA: NEGATIVE
Nitrite: NEGATIVE
PH: 5 (ref 5.0–8.0)
Protein, ur: NEGATIVE
Specific Gravity, Urine: 1.021 (ref 1.001–1.035)

## 2016-01-26 LAB — MICROALBUMIN / CREATININE URINE RATIO
Creatinine, Urine: 187 mg/dL (ref 20–370)
Microalb Creat Ratio: 4 mcg/mg creat (ref <30)
Microalb, Ur: 0.7 mg/dL

## 2016-01-26 LAB — TESTOSTERONE: TESTOSTERONE: 396 ng/dL (ref 250–827)

## 2016-01-26 LAB — MAGNESIUM: MAGNESIUM: 2.1 mg/dL (ref 1.5–2.5)

## 2016-01-26 LAB — HEMOGLOBIN A1C
Hgb A1c MFr Bld: 5.1 % (ref <5.7)
Mean Plasma Glucose: 100 mg/dL

## 2016-01-26 LAB — INSULIN, RANDOM: Insulin: 22.9 u[IU]/mL — ABNORMAL HIGH (ref 2.0–19.6)

## 2016-01-26 MED ORDER — ATORVASTATIN CALCIUM 40 MG PO TABS: ORAL_TABLET | ORAL | 1 refills | Status: DC

## 2016-02-13 ENCOUNTER — Other Ambulatory Visit: Payer: Self-pay | Admitting: Internal Medicine

## 2016-02-13 DIAGNOSIS — K219 Gastro-esophageal reflux disease without esophagitis: Secondary | ICD-10-CM

## 2016-02-13 MED ORDER — PANTOPRAZOLE SODIUM 40 MG PO TBEC: DELAYED_RELEASE_TABLET | ORAL | 1 refills | Status: DC

## 2016-02-29 ENCOUNTER — Other Ambulatory Visit: Payer: Self-pay | Admitting: Nurse Practitioner

## 2016-05-10 ENCOUNTER — Encounter: Payer: Self-pay | Admitting: Internal Medicine

## 2016-05-10 ENCOUNTER — Ambulatory Visit (INDEPENDENT_AMBULATORY_CARE_PROVIDER_SITE_OTHER): Payer: 59 | Admitting: Internal Medicine

## 2016-05-10 VITALS — BP 142/66 | HR 54 | Temp 97.8°F | Resp 16 | Ht 75.5 in | Wt 185.0 lb

## 2016-05-10 DIAGNOSIS — Z79899 Other long term (current) drug therapy: Secondary | ICD-10-CM

## 2016-05-10 DIAGNOSIS — Z720 Tobacco use: Secondary | ICD-10-CM | POA: Diagnosis not present

## 2016-05-10 DIAGNOSIS — E559 Vitamin D deficiency, unspecified: Secondary | ICD-10-CM | POA: Diagnosis not present

## 2016-05-10 DIAGNOSIS — I200 Unstable angina: Secondary | ICD-10-CM | POA: Diagnosis not present

## 2016-05-10 DIAGNOSIS — E782 Mixed hyperlipidemia: Secondary | ICD-10-CM

## 2016-05-10 DIAGNOSIS — I10 Essential (primary) hypertension: Secondary | ICD-10-CM

## 2016-05-10 LAB — HEPATIC FUNCTION PANEL
ALBUMIN: 4.5 g/dL (ref 3.6–5.1)
ALT: 30 U/L (ref 9–46)
AST: 22 U/L (ref 10–35)
Alkaline Phosphatase: 58 U/L (ref 40–115)
BILIRUBIN DIRECT: 0.3 mg/dL — AB (ref <=0.2)
BILIRUBIN TOTAL: 1.1 mg/dL (ref 0.2–1.2)
Indirect Bilirubin: 0.8 mg/dL (ref 0.2–1.2)
Total Protein: 7.5 g/dL (ref 6.1–8.1)

## 2016-05-10 LAB — CBC WITH DIFFERENTIAL/PLATELET
BASOS ABS: 56 {cells}/uL (ref 0–200)
Basophils Relative: 1 %
EOS ABS: 168 {cells}/uL (ref 15–500)
EOS PCT: 3 %
HCT: 46.4 % (ref 38.5–50.0)
Hemoglobin: 15.6 g/dL (ref 13.2–17.1)
LYMPHS PCT: 32 %
Lymphs Abs: 1792 cells/uL (ref 850–3900)
MCH: 31.7 pg (ref 27.0–33.0)
MCHC: 33.6 g/dL (ref 32.0–36.0)
MCV: 94.3 fL (ref 80.0–100.0)
MONOS PCT: 7 %
MPV: 10.3 fL (ref 7.5–12.5)
Monocytes Absolute: 392 cells/uL (ref 200–950)
NEUTROS PCT: 57 %
Neutro Abs: 3192 cells/uL (ref 1500–7800)
Platelets: 192 10*3/uL (ref 140–400)
RBC: 4.92 MIL/uL (ref 4.20–5.80)
RDW: 14.2 % (ref 11.0–15.0)
WBC: 5.6 10*3/uL (ref 3.8–10.8)

## 2016-05-10 LAB — LIPID PANEL
CHOLESTEROL: 142 mg/dL (ref <200)
HDL: 91 mg/dL (ref >40)
LDL Cholesterol: 29 mg/dL (ref <100)
TRIGLYCERIDES: 111 mg/dL (ref <150)
Total CHOL/HDL Ratio: 1.6 Ratio (ref <5.0)
VLDL: 22 mg/dL (ref <30)

## 2016-05-10 LAB — BASIC METABOLIC PANEL WITH GFR
BUN: 17 mg/dL (ref 7–25)
CALCIUM: 9.4 mg/dL (ref 8.6–10.3)
CO2: 25 mmol/L (ref 20–31)
CREATININE: 1.1 mg/dL (ref 0.70–1.33)
Chloride: 103 mmol/L (ref 98–110)
GFR, Est African American: 88 mL/min (ref >=60)
GFR, Est Non African American: 76 mL/min (ref >=60)
Glucose, Bld: 83 mg/dL (ref 65–99)
Potassium: 4.5 mmol/L (ref 3.5–5.3)
SODIUM: 139 mmol/L (ref 135–146)

## 2016-05-10 LAB — TSH: TSH: 0.7 mIU/L (ref 0.40–4.50)

## 2016-05-10 NOTE — Progress Notes (Signed)
Assessment and Plan:  Hypertension:  -patient has cut his atenolol back to 1/2 tablet due to "weird feelings" -have asked that he monitor BP at home, if still elevated may need to increase back to whole tablet -Continue medication,  -monitor blood pressure at home.  -Continue DASH diet.   -Reminder to go to the ER if any CP, SOB, nausea, dizziness, severe HA, changes vision/speech, left arm numbness and tingling, and jaw pain.  Cholesterol: -cont statin -Continue diet and exercise.  -Check cholesterol.   Pre-diabetes: -Continue diet and exercise.  -Check A1C  Vitamin D Def: -check level -continue medications.   Angina -stent was placed -needs follow-up with cardiology -will send results to Dr. Marlou Porch  Tobacco -not interested in quitting  Continue diet and meds as discussed. Further disposition pending results of labs.  HPI 56 y.o. male  presents for 3 month follow up with hypertension, hyperlipidemia, prediabetes and vitamin D.   His blood pressure has been controlled at home, today their BP is BP: (!) 142/66.   He does workout. He denies chest pain, shortness of breath, dizziness.  He reports that he does have an electronic cuff.  He reports that he has not had any chest pain or shortness of breath.  He reports that he is seeing cardiology once per year.  He reports that he is only taking 1/2 tablet of atenolol currently.  He reports that he told Dr. Melford Aase and he was okay with that.     He is on cholesterol medication and denies myalgias. His cholesterol is at goal. The cholesterol last visit was:   Lab Results  Component Value Date   CHOL 134 01/25/2016   HDL 86 01/25/2016   LDLCALC 34 01/25/2016   TRIG 72 01/25/2016   CHOLHDL 1.6 01/25/2016  He does well with the lipitor.  No myalgias or muscle cramps.     He has been working on diet and exercise for prediabetes, and denies foot ulcerations, hyperglycemia, hypoglycemia , increased appetite, nausea, paresthesia of  the feet, polydipsia, polyuria, visual disturbances and vomiting. Last A1C in the office was:  Lab Results  Component Value Date   HGBA1C 5.1 01/25/2016    Patient is on Vitamin D supplement.  Lab Results  Component Value Date   VD25OH 45 01/25/2016     He reports that he has been having a runny nose and some dizziness this mrning.  He reports that he has clear rhinorrhea. He does not take any over the counter medication.    He is continuing to smoke.  He reports not interest in quitting smoking at this time.   Current Medications:  Current Outpatient Prescriptions on File Prior to Visit  Medication Sig Dispense Refill  . aspirin EC 81 MG tablet Take 1 tablet (81 mg total) by mouth daily. 90 tablet 3  . atenolol (TENORMIN) 25 MG tablet TAKE 1 TABLET (25 MG TOTAL) BY MOUTH DAILY. 30 tablet 5  . atorvastatin (LIPITOR) 40 MG tablet Take 1 tablet daily for cholesterol 90 tablet 1  . clopidogrel (PLAVIX) 75 MG tablet TAKE 1 TABLET (75 MG TOTAL) BY MOUTH DAILY WITH BREAKFAST. 30 tablet 5  . lisinopril (PRINIVIL,ZESTRIL) 5 MG tablet TAKE 1 TABLET (5 MG TOTAL) BY MOUTH DAILY. 30 tablet 5  . nitroGLYCERIN (NITROSTAT) 0.4 MG SL tablet Place 1 tablet (0.4 mg total) under the tongue every 5 (five) minutes as needed for chest pain. 25 tablet 12  . pantoprazole (PROTONIX) 40 MG tablet Take 1 tablet  daily for heartburn and indigestion 90 tablet 1   No current facility-administered medications on file prior to visit.     Medical History:  Past Medical History:  Diagnosis Date  . Coronary artery disease    a. Cath ~ 25 yrs ago, dx w/ pleurisy;  b. 07/2015 Cath/PCI: LM nl, LAD 15m (2.75x24 Synergy DES), LCX nl, RCA nl.  Marland Kitchen GERD (gastroesophageal reflux disease)   . Hypertensive heart disease   . Tobacco abuse     Allergies: No Known Allergies   Review of Systems:  Review of Systems  Constitutional: Negative for chills, fever and malaise/fatigue.  HENT: Positive for congestion. Negative for  ear pain and sore throat.   Eyes: Negative.   Respiratory: Negative for cough, shortness of breath and wheezing.   Cardiovascular: Negative for chest pain, palpitations and leg swelling.  Gastrointestinal: Negative for abdominal pain, blood in stool, constipation, diarrhea, heartburn and melena.  Genitourinary: Negative.   Skin: Negative.   Neurological: Negative for dizziness, sensory change, loss of consciousness and headaches.  Psychiatric/Behavioral: Negative for depression. The patient is not nervous/anxious and does not have insomnia.     Family history- Review and unchanged  Social history- Review and unchanged  Physical Exam: BP (!) 142/66   Pulse (!) 54   Temp 97.8 F (36.6 C) (Temporal)   Resp 16   Ht 6' 3.5" (1.918 m)   Wt 185 lb (83.9 kg)   BMI 22.82 kg/m  Wt Readings from Last 3 Encounters:  05/10/16 185 lb (83.9 kg)  01/25/16 185 lb 9.6 oz (84.2 kg)  08/24/15 186 lb 6.4 oz (84.6 kg)    General Appearance: Well nourished well developed, in no apparent distress. Eyes: PERRLA, EOMs, conjunctiva no swelling or erythema ENT/Mouth: Ear canals normal without obstruction, swelling, erythma, discharge.  TMs normal bilaterally.  Oropharynx moist, clear, without exudate, or postoropharyngeal swelling. Neck: Supple, thyroid normal,no cervical adenopathy  Respiratory: Respiratory effort normal, Breath sounds clear A&P without rhonchi, wheeze, or rale.  No retractions, no accessory usage. Cardio: RRR with no MRGs. Brisk peripheral pulses without edema.  Abdomen: Soft, + BS,  Non tender, no guarding, rebound, hernias, masses. Musculoskeletal: Full ROM, 5/5 strength, Normal gait Skin: Warm, dry without rashes, lesions, ecchymosis.  Neuro: Awake and oriented X 3, Cranial nerves intact. Normal muscle tone, no cerebellar symptoms. Psych: Normal affect, Insight and Judgment appropriate.    Starlyn Skeans, PA-C 9:47 AM [facility]

## 2016-06-25 ENCOUNTER — Other Ambulatory Visit: Payer: Self-pay

## 2016-06-25 DIAGNOSIS — K219 Gastro-esophageal reflux disease without esophagitis: Secondary | ICD-10-CM

## 2016-06-25 MED ORDER — PANTOPRAZOLE SODIUM 40 MG PO TBEC: DELAYED_RELEASE_TABLET | ORAL | 1 refills | Status: DC

## 2016-08-08 ENCOUNTER — Ambulatory Visit: Payer: Self-pay | Admitting: Internal Medicine

## 2016-09-02 ENCOUNTER — Other Ambulatory Visit: Payer: Self-pay | Admitting: Nurse Practitioner

## 2016-10-08 ENCOUNTER — Other Ambulatory Visit: Payer: Self-pay | Admitting: Cardiology

## 2016-10-08 NOTE — Telephone Encounter (Signed)
New message    Pt is calling.    *STAT* If patient is at the pharmacy, call can be transferred to refill team.   1. Which medications need to be refilled? (please list name of each medication and dose if known) lisinopril 5 mg clopidogrel 75 mg  2. Which pharmacy/location (including street and city if local pharmacy) is medication to be sent to[facility] [place]   3. Do they need a 30 day or 90 day supply? 30 day

## 2016-11-09 ENCOUNTER — Other Ambulatory Visit: Payer: Self-pay | Admitting: Internal Medicine

## 2016-11-09 ENCOUNTER — Other Ambulatory Visit: Payer: Self-pay | Admitting: Physician Assistant

## 2016-11-09 DIAGNOSIS — E782 Mixed hyperlipidemia: Secondary | ICD-10-CM

## 2016-11-09 DIAGNOSIS — K219 Gastro-esophageal reflux disease without esophagitis: Secondary | ICD-10-CM

## 2016-11-14 ENCOUNTER — Other Ambulatory Visit: Payer: Self-pay | Admitting: *Deleted

## 2016-11-14 ENCOUNTER — Encounter: Payer: Self-pay | Admitting: Internal Medicine

## 2016-11-14 ENCOUNTER — Ambulatory Visit (INDEPENDENT_AMBULATORY_CARE_PROVIDER_SITE_OTHER): Payer: 59 | Admitting: Internal Medicine

## 2016-11-14 VITALS — BP 124/76 | HR 64 | Temp 97.4°F | Resp 18 | Ht 75.5 in | Wt 188.6 lb

## 2016-11-14 DIAGNOSIS — E559 Vitamin D deficiency, unspecified: Secondary | ICD-10-CM

## 2016-11-14 DIAGNOSIS — E782 Mixed hyperlipidemia: Secondary | ICD-10-CM

## 2016-11-14 DIAGNOSIS — Z79899 Other long term (current) drug therapy: Secondary | ICD-10-CM | POA: Diagnosis not present

## 2016-11-14 DIAGNOSIS — R7303 Prediabetes: Secondary | ICD-10-CM | POA: Diagnosis not present

## 2016-11-14 DIAGNOSIS — I10 Essential (primary) hypertension: Secondary | ICD-10-CM | POA: Diagnosis not present

## 2016-11-14 DIAGNOSIS — I2510 Atherosclerotic heart disease of native coronary artery without angina pectoris: Secondary | ICD-10-CM

## 2016-11-14 MED ORDER — NITROGLYCERIN 0.4 MG SL SUBL: 0.4000 mg | SUBLINGUAL_TABLET | SUBLINGUAL | 6 refills | Status: DC | PRN

## 2016-11-14 NOTE — Patient Instructions (Signed)

## 2016-11-14 NOTE — Progress Notes (Signed)
This very nice 56 y.o.  DWM presents way over due for  follow up with Hypertension, Hyperlipidemia, Pre-Diabetes and Vitamin D Deficiency. Patient has not been reliable for recommended follow-ups.      Patient is treated for HTN & BP has been controlled at home. Today's BP is at goal - 124/76. In May 2017 , he presented with ACS and has PCA/DES implanted  In a 90%mid  LAD by Dr Marlou Porch.  Since then he has been well and hence not followed up.  Unfortunately he continues to smoke cigarettes. He denies complaints of any cardiac type chest pain, palpitations, dyspnea/orthopnea/PND, dizziness, claudication, or dependent edema.     Hyperlipidemia is controlled with diet & meds. Patient denies myalgias or other med SE's. Last Lipids were  Lab Results  Component Value Date   CHOL 142 05/10/2016   HDL 91 05/10/2016   LDLCALC 29 05/10/2016   TRIG 111 05/10/2016   CHOLHDL 1.6 05/10/2016      Also, the patient is screened expectantly for PreDiabetes and has had no symptoms of reactive hypoglycemia, diabetic polys, paresthesias or visual blurring.  Last A1c was  Lab Results  Component Value Date   HGBA1C 5.1 01/25/2016      Further, the patient also has history of Vitamin D Deficiency and does not supplement vitamin D as recommended. Last vitamin D was still low (goal 70-100).  Lab Results  Component Value Date   VD25OH 45 01/25/2016   Current Outpatient Prescriptions on File Prior to Visit  Medication Sig  . aspirin EC 81 MG tablet Take 1 tablet  daily.  Marland Kitchen atenolol  25 MG tablet TAKE 1 TABLET  DAILY.  Marland Kitchen atorvastatin  40 MG tablet TAKE 1 TABLET DAILY   . clopidogrel  75 MG tablet TAKE 1 tablet daily  . lisinopril  5 MG tablet Take 1 tablet daily.  . pantoprazole  40 MG tablet TAKE 1 TABLET EVERY DAY   No Known Allergies   PMHx:   Past Medical History:  Diagnosis Date  . Coronary artery disease    a. Cath ~ 25 yrs ago, dx w/ pleurisy;  b. 07/2015 Cath/PCI: LM nl, LAD 8m (2.75x24 Synergy  DES), LCX nl, RCA nl.  Marland Kitchen GERD (gastroesophageal reflux disease)   . Hypertensive heart disease   . Tobacco abuse    Immunization History  Administered Date(s) Administered  . Tdap 01/25/2016   Past Surgical History:  Procedure Laterality Date  . BACK SURGERY    . CARDIAC CATHETERIZATION N/A 08/11/2015   Procedure: Left Heart Cath and Coronary Angiography;  Surgeon: Jerline Pain, MD;  Location: [place] [facility];  Service: Cardiovascular;  Laterality: N/A;  . CARDIAC CATHETERIZATION N/A 08/11/2015   Procedure: Coronary Stent Intervention;  Surgeon: Jerline Pain, MD;  Location: [place] [facility];  Service: Cardiovascular;  Laterality: N/A;  . CARDIAC CATHETERIZATION N/A 08/11/2015   Procedure: Coronary Stent Intervention;  Surgeon: Jettie Booze, MD;  Location: [place] [facility];  Service: Cardiovascular;  Laterality: N/A;  . LUMBAR DISC SURGERY     FHx:    Reviewed / unchanged  SHx:    Reviewed / unchanged  Systems Review:  Constitutional: Denies fever, chills, wt changes, headaches, insomnia, fatigue, night sweats, change in appetite. Eyes: Denies redness, blurred vision, diplopia, discharge, itchy, watery eyes.  ENT: Denies discharge, congestion, post nasal drip, epistaxis, sore throat, earache, hearing loss, dental pain, tinnitus, vertigo, sinus pain, snoring.  CV: Denies  chest pain, palpitations, irregular heartbeat, syncope, dyspnea, diaphoresis, orthopnea, PND, claudication or edema. Respiratory: denies cough, dyspnea, DOE, pleurisy, hoarseness, laryngitis, wheezing.  Gastrointestinal: Denies dysphagia, odynophagia, heartburn, reflux, water brash, abdominal pain or cramps, nausea, vomiting, bloating, diarrhea, constipation, hematemesis, melena, hematochezia  or hemorrhoids. Genitourinary: Denies dysuria, frequency, urgency, nocturia, hesitancy, discharge, hematuria or flank pain. Musculoskeletal: Denies arthralgias, myalgias, stiffness, jt. swelling, pain, limping or  strain/sprain.  Skin: Denies pruritus, rash, hives, warts, acne, eczema or change in skin lesion(s). Neuro: No weakness, tremor, incoordination, spasms, paresthesia or pain. Psychiatric: Denies confusion, memory loss or sensory loss. Endo: Denies change in weight, skin or hair change.  Heme/Lymph: No excessive bleeding, bruising or enlarged lymph nodes.  Physical Exam  BP 124/76   Pulse 64   Temp (!) 97.4 F (36.3 C)   Resp 18   Ht 6' 3.5" (1.918 m)   Wt 188 lb 9.6 oz (85.5 kg)   BMI 23.26 kg/m   Appears well nourished, well groomed  and in no distress.  Eyes: PERRLA, EOMs, conjunctiva no swelling or erythema. Sinuses: No frontal/maxillary tenderness ENT/Mouth: EAC's clear, TM's nl w/o erythema, bulging. Nares clear w/o erythema, swelling, exudates. Oropharynx clear without erythema or exudates. Oral hygiene is good. Tongue normal, non obstructing. Hearing intact.  Neck: Supple. Thyroid nl. Car 2+/2+ without bruits, nodes or JVD. Chest: Respirations nl with BS clear & equal w/o rales, rhonchi, wheezing or stridor.  Cor: Heart sounds normal w/ regular rate and rhythm without sig. murmurs, gallops, clicks or rubs. Peripheral pulses normal and equal  without edema.  Abdomen: Soft & bowel sounds normal. Non-tender w/o guarding, rebound, hernias, masses or organomegaly.  Lymphatics: Unremarkable.  Musculoskeletal: Full ROM all peripheral extremities, joint stability, 5/5 strength and normal gait.  Skin: Warm, dry without exposed rashes, lesions or ecchymosis apparent.  Neuro: Cranial nerves intact, reflexes equal bilaterally. Sensory-motor testing grossly intact. Tendon reflexes grossly intact.  Pysch: Alert & oriented x 3.  Insight and judgement nl & appropriate. No ideations.  Assessment and Plan:  1. Essential hypertension  - Continue medication, monitor blood pressure at home.  - Continue DASH diet. Reminder to go to the ER if any CP,  SOB, nausea, dizziness, severe HA,  changes vision/speech.  - CBC with Differential/Platelet - BASIC METABOLIC PANEL WITH GFR - Magnesium - TSH  2. Hyperlipidemia, mixed  - Continue diet/meds, exercise,& lifestyle modifications.  - Continue monitor periodic cholesterol/liver & renal functions   - Hepatic function panel - Lipid panel - TSH  3. Prediabetes  - Continue diet, exercise, lifestyle modifications.  - Monitor appropriate labs.  - Hemoglobin A1c - Insulin, fasting  4. Vitamin D deficiency  - Continue supplementation.  - VITAMIN D 25 Hydroxy  5. Coronary artery disease involving native coronary artery of native heart without angina pectoris  - Refilled Nitrostat 0.4 mg   - Lipid panel - Hemoglobin A1c - Insulin, fasting  6. Medication management  - CBC with Differential/Platelet - BASIC METABOLIC PANEL WITH GFR - Hepatic function panel - Magnesium - Lipid panel - TSH - Hemoglobin A1c - Insulin, fasting - VITAMIN D 25 Hydroxy        Discussed  regular exercise, BP monitoring, weight control to achieve/maintain BMI less than 25 and discussed med and SE's. Recommended labs to assess and monitor clinical status with further disposition pending results of labs. Over 30 minutes of exam, counseling, chart review was performed.

## 2016-11-15 LAB — CBC WITH DIFFERENTIAL/PLATELET
BASOS PCT: 1 %
Basophils Absolute: 60 cells/uL (ref 0–200)
EOS PCT: 2.7 %
Eosinophils Absolute: 162 cells/uL (ref 15–500)
HCT: 41.8 % (ref 38.5–50.0)
HEMOGLOBIN: 14.3 g/dL (ref 13.2–17.1)
LYMPHS ABS: 1704 {cells}/uL (ref 850–3900)
MCH: 32 pg (ref 27.0–33.0)
MCHC: 34.2 g/dL (ref 32.0–36.0)
MCV: 93.5 fL (ref 80.0–100.0)
MONOS PCT: 6.9 %
MPV: 11.4 fL (ref 7.5–12.5)
NEUTROS ABS: 3660 {cells}/uL (ref 1500–7800)
Neutrophils Relative %: 61 %
Platelets: 181 10*3/uL (ref 140–400)
RBC: 4.47 10*6/uL (ref 4.20–5.80)
RDW: 13.1 % (ref 11.0–15.0)
Total Lymphocyte: 28.4 %
WBC mixed population: 414 cells/uL (ref 200–950)
WBC: 6 10*3/uL (ref 3.8–10.8)

## 2016-11-15 LAB — HEPATIC FUNCTION PANEL
AG RATIO: 1.8 (calc) (ref 1.0–2.5)
ALT: 30 U/L (ref 9–46)
AST: 22 U/L (ref 10–35)
Albumin: 4.2 g/dL (ref 3.6–5.1)
Alkaline phosphatase (APISO): 71 U/L (ref 40–115)
BILIRUBIN DIRECT: 0.3 mg/dL — AB (ref 0.0–0.2)
BILIRUBIN INDIRECT: 0.7 mg/dL (ref 0.2–1.2)
BILIRUBIN TOTAL: 1 mg/dL (ref 0.2–1.2)
GLOBULIN: 2.4 g/dL (ref 1.9–3.7)
TOTAL PROTEIN: 6.6 g/dL (ref 6.1–8.1)

## 2016-11-15 LAB — BASIC METABOLIC PANEL WITH GFR
BUN: 13 mg/dL (ref 7–25)
CALCIUM: 9 mg/dL (ref 8.6–10.3)
CO2: 28 mmol/L (ref 20–32)
CREATININE: 1.05 mg/dL (ref 0.70–1.33)
Chloride: 107 mmol/L (ref 98–110)
GFR, EST AFRICAN AMERICAN: 92 mL/min/{1.73_m2} (ref >=60)
GFR, EST NON AFRICAN AMERICAN: 80 mL/min/{1.73_m2} (ref >=60)
Glucose, Bld: 95 mg/dL (ref 65–99)
Potassium: 4.1 mmol/L (ref 3.5–5.3)
Sodium: 142 mmol/L (ref 135–146)

## 2016-11-15 LAB — VITAMIN D 25 HYDROXY (VIT D DEFICIENCY, FRACTURES): Vit D, 25-Hydroxy: 36 ng/mL (ref 30–100)

## 2016-11-15 LAB — HEMOGLOBIN A1C
Hgb A1c MFr Bld: 5.2 % of total Hgb (ref <5.7)
MEAN PLASMA GLUCOSE: 103 (calc)
eAG (mmol/L): 5.7 (calc)

## 2016-11-15 LAB — MAGNESIUM: Magnesium: 2.1 mg/dL (ref 1.5–2.5)

## 2016-11-15 LAB — LIPID PANEL
CHOL/HDL RATIO: 1.5 (calc) (ref <5.0)
CHOLESTEROL: 125 mg/dL (ref <200)
HDL: 82 mg/dL (ref >40)
LDL CHOLESTEROL (CALC): 24 mg/dL
Non-HDL Cholesterol (Calc): 43 mg/dL (calc) (ref <130)
Triglycerides: 109 mg/dL (ref <150)

## 2016-11-15 LAB — TSH: TSH: 0.43 m[IU]/L (ref 0.40–4.50)

## 2016-11-16 LAB — INSULIN, FASTING: Insulin: 12.2 u[IU]/mL (ref 2.0–19.6)

## 2016-12-05 NOTE — Progress Notes (Signed)
Cardiology Office Note    Date:  12/06/2016   ID:  Cobin Cadavid, DOB [birth year], MRN [id number]  PCP:  Unk Pinto, MD  Cardiologist:   Candee Furbish, MD     History of Present Illness:  Corey Collier is a 56 y.o. male here for follow-up of coronary artery disease status post mid LAD 90% lesion stented in mid May 2017. He has been compliant with his medications. Has noted some easy bruising with his Plavix and aspirin. Cholesterol has been excellent, LDL in fact 29. No anginal symptoms, no shortness of breath. He did note a "knot "that developed on his left calf transiently, this has resided.  To quote original consultative visit:  Previous evaluation of chest pain at the request of Dr. Melford Aase. Family history is positive for stroke in mother but no early family history of coronary artery disease. ++smoker. No prior cardiac history.  He's noticed several episodes of mid chest discomfort with associated shortness of breath while mowing lawns with a self-propelled mower that prompted him to stop and rest. No diaphoresis, no nausea, no vomiting. He noted previously that he had aching in his shoulders after lifting boxes. Nothing too strenuous triggers, SOB, CP, lightheaded. Doesn't even want to talk during this. Not convinced it is heart. When laying down can see heart beating hard.  EKG From 08/03/15 demonstrates concerning ischemic changes in V3 through V6 as well as aVF.Marland Kitchen Troponin was drawn and was normal. LDL 67. Hemoglobin 15.7.  He has noted that atenolol is made him feel tired. We cut this back. Heart rate was 50.  Had cardiac cath 25 years ago. Pleurisy Diagnosed.    Past Medical History:  Diagnosis Date  . Coronary artery disease    a. Cath ~ 25 yrs ago, dx w/ pleurisy;  b. 07/2015 Cath/PCI: LM nl, LAD 68m (2.75x24 Synergy DES), LCX nl, RCA nl.  Marland Kitchen GERD (gastroesophageal reflux disease)   . Hypertensive heart disease   . Tobacco abuse     Past Surgical History:    Procedure Laterality Date  . BACK SURGERY    . CARDIAC CATHETERIZATION N/A 08/11/2015   Procedure: Left Heart Cath and Coronary Angiography;  Surgeon: Jerline Pain, MD;  Location: [place] [facility];  Service: Cardiovascular;  Laterality: N/A;  . CARDIAC CATHETERIZATION N/A 08/11/2015   Procedure: Coronary Stent Intervention;  Surgeon: Jerline Pain, MD;  Location: [place] [facility];  Service: Cardiovascular;  Laterality: N/A;  . CARDIAC CATHETERIZATION N/A 08/11/2015   Procedure: Coronary Stent Intervention;  Surgeon: Jettie Booze, MD;  Location: [place] [facility];  Service: Cardiovascular;  Laterality: N/A;  . LUMBAR DISC SURGERY      Current Medications: Outpatient Medications Prior to Visit  Medication Sig Dispense Refill  . aspirin EC 81 MG tablet Take 1 tablet (81 mg total) by mouth daily. 90 tablet 3  . atorvastatin (LIPITOR) 40 MG tablet TAKE 1 TABLET DAILY FOR CHOLESTEROL 90 tablet 0  . lisinopril (PRINIVIL,ZESTRIL) 5 MG tablet Take 1 tablet (5 mg total) by mouth daily. Please keep upcoming appointment 9/13 at 9:40 am for more refills thanks. 30 tablet 2  . nitroGLYCERIN (NITROSTAT) 0.4 MG SL tablet Place 1 tablet (0.4 mg total) under the tongue every 5 (five) minutes as needed for chest pain. 25 tablet 6  . pantoprazole (PROTONIX) 40 MG tablet TAKE 1 TABLET BY MOUTH EVERY DAY FOR HEARTBURN AND INDIGESTION 90 tablet 1  . clopidogrel (PLAVIX) 75 MG tablet Please keep upcoming  appointment 9/13 at 9:40 am for more refills thanks. 30 tablet 2  . atenolol (TENORMIN) 25 MG tablet TAKE 1 TABLET (25 MG TOTAL) BY MOUTH DAILY. 30 tablet 5   No facility-administered medications prior to visit.      Allergies:   Patient has no known allergies.   Social History   Social History  . Marital status: Legally Separated    Spouse name: N/A  . Number of children: N/A  . Years of education: N/A   Social History Main Topics  . Smoking status: Current Every Day Smoker    Packs/day:  1.50    Years: 37.00    Types: Cigarettes  . Smokeless tobacco: Never Used  . Alcohol use 24.0 oz/week    40 Shots of liquor per week     Comment: 08/11/2015 drinks 3-4 fifths of liquor a week  . Drug use: No  . Sexual activity: Not Asked   Other Topics Concern  . None   Social History Narrative  . None     Family History:  The patient's family history includes Stroke in his father.   ROS:   Please see the history of present illness.    ROS All other systems reviewed and are negative.   PHYSICAL EXAM:   VS:  BP (!) 142/70   Pulse 65   Ht 6\' 3"  (1.905 m)   Wt 188 lb 12.8 oz (85.6 kg)   BMI 23.60 kg/m    GEN: Well nourished, well developed, in no acute distress  HEENT: normal  Neck: no JVD, carotid bruits, or masses Cardiac: RRR; no murmurs, rubs, or gallops,no edema  Respiratory:  clear to auscultation bilaterally, normal work of breathing GI: soft, nontender, nondistended, + BS MS: no deformity or atrophy , calf appears normal Skin: warm and dry, no rash Neuro:  Alert and Oriented x 3, Strength and sensation are intact Psych: euthymic mood, full affect   Wt Readings from Last 3 Encounters:  12/06/16 188 lb 12.8 oz (85.6 kg)  11/14/16 188 lb 9.6 oz (85.5 kg)  05/10/16 185 lb (83.9 kg)      Studies/Labs Reviewed:   EKG:  EKG Today 12/06/16 shows sinus rhythm 65 with no other abnormalities personally viewed-prior 08/03/15-sinus rhythm, 60 with T-wave inversion in V3 through V6 as well as 3 and aVF. Biphasic in V3. T-wave changes are concerning for ischemia.  Recent Labs: 11/14/2016: ALT 30; BUN 13; Creat 1.05; Hemoglobin 14.3; Magnesium 2.1; Platelets 181; Potassium 4.1; Sodium 142; TSH 0.43   Lipid Panel    Component Value Date/Time   CHOL 125 11/14/2016 1132   TRIG 109 11/14/2016 1132   HDL 82 11/14/2016 1132   CHOLHDL 1.5 11/14/2016 1132   VLDL 22 05/10/2016 1008   LDLCALC 29 05/10/2016 1008    Additional studies/ records that were reviewed today  include:  Prior office notes reviewed, lab work reviewed, EKG reviewed  LDL 29, HDL 82, A1c 5.2, creatinine 1.0, ALT 30    ASSESSMENT:    1. Coronary artery disease due to lipid rich plaque   2. Essential hypertension   3. Tobacco use   4. Gastroesophageal reflux disease without esophagitis      PLAN:  In order of problems listed above:  Chest pain/Angina/Coronary artery disease-Cardia catheterization 08/11/15   - Mid LAD lesion, 90% stenosed. Post intervention with a 2.75 x 24 Synergy drug-eluting stent, postdilated to 3.5 mm there is a 0% residual stenosis.  At this point, he can, for this  Plavix and continue his aspirin lifelong. This will help with some of his easy bruising. Currently having no anginal symptoms. Excellent secondary prevention.  Tobacco use  - Encourage cessation once again, this will be important to help reduce risk of future heart attack.  GERD  - Minimize alcohol intake  - PPI, no changes  Bradycardia- atenolol decreased previously. His heart rate currently is 65. Doing well.  Hyperlipidemia- atorvastatin from 10 mg up to 40 mg for further plaque stabilization, high intensity statin despite his extremely low LDL. Excellent.  Stable. I think it is reasonable to see him back on as-needed basis. Please let us know if we can be of further assistance.     Medication Adjustments/Labs and Tests Ordered: Current medicines are reviewed at length with the patient today.  Concerns regarding medicines are outlined above.  Medication changes, Labs and Tests ordered today are listed in the Patient Instructions below. Patient Instructions  Medication Instructions:  You may discontinue your Plavix. Continue all other medications as listed.  Follow-Up: Follow up as needed with Dr Marlou Porch  Thank you for choosing [facility]!!         Signed, Candee Furbish, MD  12/06/2016 10:06 AM    [address],  [place], [place]  [address] Phone: [phone]; Fax: [phone]

## 2016-12-06 ENCOUNTER — Ambulatory Visit (INDEPENDENT_AMBULATORY_CARE_PROVIDER_SITE_OTHER): Payer: 59 | Admitting: Cardiology

## 2016-12-06 ENCOUNTER — Encounter: Payer: Self-pay | Admitting: Cardiology

## 2016-12-06 VITALS — BP 142/70 | HR 65 | Ht 75.0 in | Wt 188.8 lb

## 2016-12-06 DIAGNOSIS — Z720 Tobacco use: Secondary | ICD-10-CM

## 2016-12-06 DIAGNOSIS — I2510 Atherosclerotic heart disease of native coronary artery without angina pectoris: Secondary | ICD-10-CM

## 2016-12-06 DIAGNOSIS — K219 Gastro-esophageal reflux disease without esophagitis: Secondary | ICD-10-CM

## 2016-12-06 DIAGNOSIS — I2583 Coronary atherosclerosis due to lipid rich plaque: Secondary | ICD-10-CM

## 2016-12-06 DIAGNOSIS — I10 Essential (primary) hypertension: Secondary | ICD-10-CM

## 2016-12-06 NOTE — Patient Instructions (Signed)
Medication Instructions:  You may discontinue your Plavix. Continue all other medications as listed.  Follow-Up: Follow up as needed with Dr Marlou Porch  Thank you for choosing [facility]!!

## 2016-12-12 ENCOUNTER — Telehealth: Payer: Self-pay | Admitting: Cardiology

## 2016-12-12 NOTE — Telephone Encounter (Signed)
Will forward to Dr Marlou Porch.

## 2016-12-12 NOTE — Telephone Encounter (Signed)
New message   1. lisinopril (PRINIVIL,ZESTRIL) 5 MG tablet 2.Take 1 tablet (5 mg total) by mouth daily. Please keep upcoming appointment 9/13 at 9:40 am for more refills thanks. 3. Dry cough 4. No difficulty breathing 5. Cough about  4 months 6. States he forgot to mention this on last visit  9/13 with Dr. Marlou Porch. Wants to discuss  Symptoms and possible medication change.

## 2016-12-13 MED ORDER — LOSARTAN POTASSIUM 25 MG PO TABS: 25.0000 mg | ORAL_TABLET | Freq: Every day | ORAL | 6 refills | Status: DC

## 2016-12-13 NOTE — Telephone Encounter (Signed)
Spoke with pt who is aware to stop Lisinopril and start Losartan 25 mg daily.  [facility] per pt request.  Advised to monitor BP during the change in meds.  He states understanding and will c/b with further concerns.

## 2016-12-13 NOTE — Telephone Encounter (Signed)
Stop lisinopril Start losartan 25mg  PO QD Candee Furbish, MD

## 2017-02-25 NOTE — Progress Notes (Signed)
FOLLOW UP  Assessment and Plan:   Hypertension Well controlled with current medications Monitor blood pressure at home; patient to call if consistently greater than 130/80 Continue DASH diet.   Reminder to go to the ER if any CP, SOB, nausea, dizziness, severe HA, changes vision/speech, left arm numbness and tingling and jaw pain.  Cholesterol Continue medication Continue low cholesterol diet and exercise.  Check lipid panel.   Tobacco use Discussed risks associated with tobacco use and advised to reduce or quit Patient is not ready to do so, but advised to consider strongly Will follow up at the next visit  GERD Symptoms well managed without breakthrough Will try to get off PPI given info for taper and zantac sent in  Vitamin D Def Recommended supplementation after discussion of risks associated with low levels Defer checking level as patient has not supplemented   Left wrist pain Strength and ROM maintained; recommend OTC analgesics as needed Discussed can take 3-6+ months to heal Avoid aggravating motions, wear brace at night and as possible Declines ortho referral today; he will follow up with [place] in a few months if continuing to have problems  Continue diet and meds as discussed. Further disposition pending results of labs. Discussed med's effects and SE's.   Over 30 minutes of exam, counseling, chart review, and critical decision making was performed.   Future Appointments  Date Time Provider [place]  05/28/2017 11:00 AM Unk Pinto, MD [facility] None    ----------------------------------------------------------------------------------------------------------------------  HPI 56 y.o. male  presents for 3 month follow up on hypertension, continued tobacco use and cholesterol; he has also been noted to have vitamin D deficiency but declines to supplement despite recommendation. He was prescribed vitamin D at last visit but has not been taking. He  reports mild pain to left wrist that started at work 1 month ago - no notable injury.   he currently continues to smoke 1.5 pack a day; discussed risks associated with smoking, patient is not ready to quit.   he has a diagnosis of GERD which is currently managed by protonix 40 mg daily he reports symptoms is currently well controlled, and denies breakthrough reflux, burning in chest, hoarseness or cough.  He has been on protonix for 1 year - was started after having symptoms only at night.   His blood pressure has been controlled at home, today their BP is BP: 110/70  He does workout. He denies chest pain, shortness of breath, dizziness.   He is on cholesterol medication and denies myalgias. His cholesterol is at goal. The cholesterol last visit was:   Lab Results  Component Value Date   CHOL 125 11/14/2016   HDL 82 11/14/2016   LDLCALC 29 05/10/2016   TRIG 109 11/14/2016   CHOLHDL 1.5 11/14/2016   Patient is not on Vitamin D supplement and was low at last check:    Lab Results  Component Value Date   VD25OH 36 11/14/2016      Current Medications:  Current Outpatient Medications on File Prior to Visit  Medication Sig  . aspirin EC 81 MG tablet Take 1 tablet (81 mg total) by mouth daily.  Marland Kitchen atenolol (TENORMIN) 25 MG tablet Take 12.5 mg by mouth daily.  Marland Kitchen atorvastatin (LIPITOR) 40 MG tablet TAKE 1 TABLET DAILY FOR CHOLESTEROL  . losartan (COZAAR) 25 MG tablet Take 1 tablet (25 mg total) by mouth daily.  . nitroGLYCERIN (NITROSTAT) 0.4 MG SL tablet Place 1 tablet (0.4 mg total) under the tongue  every 5 (five) minutes as needed for chest pain.  . pantoprazole (PROTONIX) 40 MG tablet TAKE 1 TABLET BY MOUTH EVERY DAY FOR HEARTBURN AND INDIGESTION   No current facility-administered medications on file prior to visit.      Allergies: No Known Allergies   Medical History:  Past Medical History:  Diagnosis Date  . Coronary artery disease    a. Cath ~ 25 yrs ago, dx w/ pleurisy;  b.  07/2015 Cath/PCI: LM nl, LAD 37m (2.75x24 Synergy DES), LCX nl, RCA nl.  Marland Kitchen GERD (gastroesophageal reflux disease)   . Hypertensive heart disease   . Tobacco abuse    Family history- Reviewed and unchanged Social history- Reviewed and unchanged   Review of Systems:  Review of Systems  Constitutional: Negative for malaise/fatigue and weight loss.  HENT: Negative for hearing loss and tinnitus.   Eyes: Negative for blurred vision and double vision.  Respiratory: Negative for cough, shortness of breath and wheezing.   Cardiovascular: Negative for chest pain, palpitations, orthopnea, claudication and leg swelling.  Gastrointestinal: Negative for abdominal pain, blood in stool, constipation, diarrhea, heartburn, melena, nausea and vomiting.  Genitourinary: Negative.   Musculoskeletal: Negative for joint pain and myalgias.  Skin: Negative for rash.  Neurological: Negative for dizziness, tingling, sensory change, weakness and headaches.  Endo/Heme/Allergies: Negative for polydipsia.  Psychiatric/Behavioral: Negative.   All other systems reviewed and are negative.     Physical Exam: BP 110/70   Pulse 60   Temp 97.7 F (36.5 C)   Ht 6\' 3"  (1.905 m)   Wt 190 lb 3.2 oz (86.3 kg)   SpO2 98%   BMI 23.77 kg/m  Wt Readings from Last 3 Encounters:  02/26/17 190 lb 3.2 oz (86.3 kg)  12/06/16 188 lb 12.8 oz (85.6 kg)  11/14/16 188 lb 9.6 oz (85.5 kg)   General Appearance: Well nourished, in no apparent distress. Eyes: PERRLA, EOMs, conjunctiva no swelling or erythema Sinuses: No Frontal/maxillary tenderness ENT/Mouth: Ext aud canals clear, TMs without erythema, bulging. No erythema, swelling, or exudate on post pharynx.  Tonsils not swollen or erythematous. Hearing normal.  Neck: Supple, thyroid normal.  Respiratory: Respiratory effort normal, BS equal bilaterally without rales, rhonchi, wheezing or stridor.  Cardio: RRR with no MRGs. Brisk peripheral pulses without edema.  Abdomen:  Soft, + BS.  Non tender, no guarding, rebound, hernias, masses. Lymphatics: Non tender without lymphadenopathy.  Musculoskeletal: Full ROM, 5/5 strength, Normal gait - some pain but no notable weakness with flexion of L wrist.  Skin: Warm, dry without rashes, lesions, ecchymosis.  Neuro: Cranial nerves intact. No cerebellar symptoms.  Psych: Awake and oriented X 3, normal affect, Insight and Judgment appropriate.    Izora Ribas, NP 10:53 AM [facility]

## 2017-02-26 ENCOUNTER — Ambulatory Visit: Payer: 59 | Admitting: Adult Health

## 2017-02-26 ENCOUNTER — Encounter: Payer: Self-pay | Admitting: Adult Health

## 2017-02-26 VITALS — BP 110/70 | HR 60 | Temp 97.7°F | Ht 75.0 in | Wt 190.2 lb

## 2017-02-26 DIAGNOSIS — K219 Gastro-esophageal reflux disease without esophagitis: Secondary | ICD-10-CM

## 2017-02-26 DIAGNOSIS — Z79899 Other long term (current) drug therapy: Secondary | ICD-10-CM

## 2017-02-26 DIAGNOSIS — I10 Essential (primary) hypertension: Secondary | ICD-10-CM

## 2017-02-26 DIAGNOSIS — E782 Mixed hyperlipidemia: Secondary | ICD-10-CM

## 2017-02-26 DIAGNOSIS — E559 Vitamin D deficiency, unspecified: Secondary | ICD-10-CM

## 2017-02-26 DIAGNOSIS — Z720 Tobacco use: Secondary | ICD-10-CM | POA: Diagnosis not present

## 2017-02-26 DIAGNOSIS — M25532 Pain in left wrist: Secondary | ICD-10-CM

## 2017-02-26 LAB — BASIC METABOLIC PANEL WITH GFR
BUN: 14 mg/dL (ref 7–25)
CALCIUM: 10 mg/dL (ref 8.6–10.3)
CHLORIDE: 104 mmol/L (ref 98–110)
CO2: 30 mmol/L (ref 20–32)
CREATININE: 1.04 mg/dL (ref 0.70–1.33)
GFR, Est African American: 93 mL/min/{1.73_m2} (ref >=60)
GFR, Est Non African American: 80 mL/min/{1.73_m2} (ref >=60)
Glucose, Bld: 89 mg/dL (ref 65–99)
Potassium: 4.6 mmol/L (ref 3.5–5.3)
Sodium: 139 mmol/L (ref 135–146)

## 2017-02-26 LAB — HEPATIC FUNCTION PANEL
AG RATIO: 1.6 (calc) (ref 1.0–2.5)
ALBUMIN MSPROF: 4.5 g/dL (ref 3.6–5.1)
ALT: 33 U/L (ref 9–46)
AST: 25 U/L (ref 10–35)
Alkaline phosphatase (APISO): 70 U/L (ref 40–115)
BILIRUBIN DIRECT: 0.2 mg/dL (ref 0.0–0.2)
GLOBULIN: 2.8 g/dL (ref 1.9–3.7)
Indirect Bilirubin: 0.7 mg/dL (calc) (ref 0.2–1.2)
TOTAL PROTEIN: 7.3 g/dL (ref 6.1–8.1)
Total Bilirubin: 0.9 mg/dL (ref 0.2–1.2)

## 2017-02-26 LAB — CBC WITH DIFFERENTIAL/PLATELET
BASOS PCT: 0.8 %
Basophils Absolute: 49 cells/uL (ref 0–200)
EOS ABS: 153 {cells}/uL (ref 15–500)
Eosinophils Relative: 2.5 %
HCT: 44.2 % (ref 38.5–50.0)
HEMOGLOBIN: 15.3 g/dL (ref 13.2–17.1)
LYMPHS ABS: 2105 {cells}/uL (ref 850–3900)
MCH: 31.7 pg (ref 27.0–33.0)
MCHC: 34.6 g/dL (ref 32.0–36.0)
MCV: 91.5 fL (ref 80.0–100.0)
MPV: 11.5 fL (ref 7.5–12.5)
Monocytes Relative: 8.3 %
NEUTROS ABS: 3288 {cells}/uL (ref 1500–7800)
Neutrophils Relative %: 53.9 %
PLATELETS: 195 10*3/uL (ref 140–400)
RBC: 4.83 10*6/uL (ref 4.20–5.80)
RDW: 12.7 % (ref 11.0–15.0)
TOTAL LYMPHOCYTE: 34.5 %
WBC mixed population: 506 cells/uL (ref 200–950)
WBC: 6.1 10*3/uL (ref 3.8–10.8)

## 2017-02-26 LAB — LIPID PANEL
CHOL/HDL RATIO: 1.9 (calc) (ref <5.0)
CHOLESTEROL: 154 mg/dL (ref <200)
HDL: 82 mg/dL (ref >40)
LDL CHOLESTEROL (CALC): 54 mg/dL
Non-HDL Cholesterol (Calc): 72 mg/dL (calc) (ref <130)
Triglycerides: 98 mg/dL (ref <150)

## 2017-02-26 LAB — TSH: TSH: 0.46 mIU/L (ref 0.40–4.50)

## 2017-02-26 MED ORDER — RANITIDINE HCL 300 MG PO TABS: ORAL_TABLET | ORAL | 1 refills | Status: DC

## 2017-02-26 NOTE — Patient Instructions (Addendum)
Write a list of pros and cons of smoking and post it where you see it regularly    GETTING OFF OF PPI's    Nexium/protonix/prilosec/Omeprazole/Dexilant/Aciphex are called PPI's, they are great at healing your stomach but should only be taken for a short period of time.     Recent studies have shown that taken for a long time they  can increase the risk of osteoporosis (weakening of your bones), pneumonia, low magnesium, restless legs, Cdiff (infection that causes diarrhea), DEMENTIA and most recently kidney damage / disease / insufficiency.     Due to this information we want to try to stop the PPI but if you try to stop it abruptly this can cause rebound acid and worsening symptoms.   So this is how we want you to get off the PPI: Generic is always fine!!  - Start taking the nexium/protonix/prilosec/PPI  every other day with  zantac (ranitidine) OR pepcid famotadine 2 x a day for 2-4 weeks - some people stay on this dosage and can not taper off further. Our main goal is to limit the dosage and amount you are taking so if you need to stay on this dose.   - then decrease the PPI to every 3 days while taking the zantac or pepcid 300mg  twice a day the other  days for 2-4  Weeks  - then you can try the zantac or pepcid 300mg  once at night or up to 2 x day as needed.  - you can continue on this once at night or stop all together  - Avoid alcohol, spicy foods, NSAIDS (aleve, ibuprofen) at this time. See foods below.   +++++++++++++++++++++++++++++++++++++++++++  Food Choices for Gastroesophageal Reflux Disease  When you have gastroesophageal reflux disease (GERD), the foods you eat and your eating habits are very important. Choosing the right foods can help ease the discomfort of GERD. WHAT GENERAL GUIDELINES DO I NEED TO FOLLOW?  Choose fruits, vegetables, whole grains, low-fat dairy products, and low-fat meat, fish, and poultry.  Limit fats such as oils, salad dressings, butter, nuts,  and avocado.  Keep a food diary to identify foods that cause symptoms.  Avoid foods that cause reflux. These may be different for different people.  Eat frequent small meals instead of three large meals each day.  Eat your meals slowly, in a relaxed setting.  Limit fried foods.  Cook foods using methods other than frying.  Avoid drinking alcohol.  Avoid drinking large amounts of liquids with your meals.  Avoid bending over or lying down until 2-3 hours after eating.   WHAT FOODS ARE NOT RECOMMENDED? The following are some foods and drinks that may worsen your symptoms:  Vegetables Tomatoes. Tomato juice. Tomato and spaghetti sauce. Chili peppers. Onion and garlic. Horseradish. Fruits Oranges, grapefruit, and lemon (fruit and juice). Meats High-fat meats, fish, and poultry. This includes hot dogs, ribs, ham, sausage, salami, and bacon. Dairy Whole milk and chocolate milk. Sour cream. Cream. Butter. Ice cream. Cream cheese.  Beverages Coffee and tea, with or without caffeine. Carbonated beverages or energy drinks. Condiments Hot sauce. Barbecue sauce.  Sweets/Desserts Chocolate and cocoa. Donuts. Peppermint and spearmint. Fats and Oils High-fat foods, including [place] fries and potato chips. Other Vinegar. Strong spices, such as black pepper, white pepper, red pepper, cayenne, curry powder, cloves, ginger, and chili powder.

## 2017-03-26 ENCOUNTER — Other Ambulatory Visit: Payer: Self-pay | Admitting: Nurse Practitioner

## 2017-05-28 ENCOUNTER — Encounter: Payer: Self-pay | Admitting: Internal Medicine

## 2017-05-28 ENCOUNTER — Ambulatory Visit (INDEPENDENT_AMBULATORY_CARE_PROVIDER_SITE_OTHER): Payer: 59 | Admitting: Internal Medicine

## 2017-05-28 VITALS — BP 168/102 | HR 56 | Temp 97.5°F | Resp 16 | Ht 75.5 in | Wt 192.2 lb

## 2017-05-28 DIAGNOSIS — Z1211 Encounter for screening for malignant neoplasm of colon: Secondary | ICD-10-CM

## 2017-05-28 DIAGNOSIS — Z79899 Other long term (current) drug therapy: Secondary | ICD-10-CM | POA: Diagnosis not present

## 2017-05-28 DIAGNOSIS — Z1212 Encounter for screening for malignant neoplasm of rectum: Secondary | ICD-10-CM

## 2017-05-28 DIAGNOSIS — R7303 Prediabetes: Secondary | ICD-10-CM

## 2017-05-28 DIAGNOSIS — Z125 Encounter for screening for malignant neoplasm of prostate: Secondary | ICD-10-CM | POA: Diagnosis not present

## 2017-05-28 DIAGNOSIS — I10 Essential (primary) hypertension: Secondary | ICD-10-CM | POA: Diagnosis not present

## 2017-05-28 DIAGNOSIS — K219 Gastro-esophageal reflux disease without esophagitis: Secondary | ICD-10-CM | POA: Diagnosis not present

## 2017-05-28 DIAGNOSIS — F17200 Nicotine dependence, unspecified, uncomplicated: Secondary | ICD-10-CM

## 2017-05-28 DIAGNOSIS — E559 Vitamin D deficiency, unspecified: Secondary | ICD-10-CM

## 2017-05-28 DIAGNOSIS — Z0001 Encounter for general adult medical examination with abnormal findings: Secondary | ICD-10-CM

## 2017-05-28 DIAGNOSIS — Z111 Encounter for screening for respiratory tuberculosis: Secondary | ICD-10-CM

## 2017-05-28 DIAGNOSIS — E782 Mixed hyperlipidemia: Secondary | ICD-10-CM

## 2017-05-28 DIAGNOSIS — Z0000 Encounter for general adult medical examination without abnormal findings: Secondary | ICD-10-CM

## 2017-05-28 DIAGNOSIS — Z8249 Family history of ischemic heart disease and other diseases of the circulatory system: Secondary | ICD-10-CM

## 2017-05-28 DIAGNOSIS — Z122 Encounter for screening for malignant neoplasm of respiratory organs: Secondary | ICD-10-CM

## 2017-05-28 DIAGNOSIS — Z136 Encounter for screening for cardiovascular disorders: Secondary | ICD-10-CM

## 2017-05-28 DIAGNOSIS — I2510 Atherosclerotic heart disease of native coronary artery without angina pectoris: Secondary | ICD-10-CM

## 2017-05-28 DIAGNOSIS — R5383 Other fatigue: Secondary | ICD-10-CM

## 2017-05-28 MED ORDER — RANITIDINE HCL 300 MG PO TABS: ORAL_TABLET | ORAL | 1 refills | Status: DC

## 2017-05-28 NOTE — Patient Instructions (Signed)
Preventive Care for Adults  A healthy lifestyle and preventive care can promote health and wellness. Preventive health guidelines for women include the following key practices.  A routine yearly physical is a good way to check with your health care provider about your health and preventive screening. It is a chance to share any concerns and updates on your health and to receive a thorough exam.  Visit your dentist for a routine exam and preventive care every 6 months. Brush your teeth twice a day and floss once a day. Good oral hygiene prevents tooth decay and gum disease.  The frequency of eye exams is based on your age, health, family medical history, use of contact lenses, and other factors. Follow your health care provider's recommendations for frequency of eye exams.  Eat a healthy diet. Foods like vegetables, fruits, whole grains, low-fat dairy products, and lean protein foods contain the nutrients you need without too many calories. Decrease your intake of foods high in solid fats, added sugars, and salt. Eat the right amount of calories for you.Get information about a proper diet from your health care provider, if necessary.  Regular physical exercise is one of the most important things you can do for your health. Most adults should get at least 150 minutes of moderate-intensity exercise (any activity that increases your heart rate and causes you to sweat) each week. In addition, most adults need muscle-strengthening exercises on 2 or more days a week.  Maintain a healthy weight. The body mass index (BMI) is a screening tool to identify possible weight problems. It provides an estimate of body fat based on height and weight. Your health care provider can find your BMI and can help you achieve or maintain a healthy weight.For adults 20 years and older:  A BMI below 18.5 is considered underweight.  A BMI of 18.5 to 24.9 is normal.  A BMI of 25 to 29.9 is considered overweight.  A BMI of  30 and above is considered obese.  Maintain normal blood lipids and cholesterol levels by exercising and minimizing your intake of saturated fat. Eat a balanced diet with plenty of fruit and vegetables. Blood tests for lipids and cholesterol should begin at age 20 and be repeated every 5 years. If your lipid or cholesterol levels are high, you are over 50, or you are at high risk for heart disease, you may need your cholesterol levels checked more frequently.Ongoing high lipid and cholesterol levels should be treated with medicines if diet and exercise are not working.  If you smoke, find out from your health care provider how to quit. If you do not use tobacco, do not start.  Lung cancer screening is recommended for adults aged 55-80 years who are at high risk for developing lung cancer because of a history of smoking. A yearly low-dose CT scan of the lungs is recommended for people who have at least a 30-pack-year history of smoking and are a current smoker or have quit within the past 15 years. A pack year of smoking is smoking an average of 1 pack of cigarettes a day for 1 year (for example: 1 pack a day for 30 years or 2 packs a day for 15 years). Yearly screening should continue until the smoker has stopped smoking for at least 15 years. Yearly screening should be stopped for people who develop a health problem that would prevent them from having lung cancer treatment.  High blood pressure causes heart disease and increases the risk of   stroke. Your blood pressure should be checked at least every 1 to 2 years. Ongoing high blood pressure should be treated with medicines if weight loss and exercise do not work.  If you are 55-79 years old, ask your health care provider if you should take aspirin to prevent strokes.  Diabetes screening involves taking a blood sample to check your fasting blood sugar level. This should be done once every 3 years, after age 45, if you are within normal weight and  without risk factors for diabetes. Testing should be considered at a younger age or be carried out more frequently if you are overweight and have at least 1 risk factor for diabetes.  Breast cancer screening is essential preventive care for women. You should practice "breast self-awareness." This means understanding the normal appearance and feel of your breasts and may include breast self-examination. Any changes detected, no matter how small, should be reported to a health care provider. Women in their 20s and 30s should have a clinical breast exam (CBE) by a health care provider as part of a regular health exam every 1 to 3 years. After age 40, women should have a CBE every year. Starting at age 40, women should consider having a mammogram (breast X-ray test) every year. Women who have a family history of breast cancer should talk to their health care provider about genetic screening. Women at a high risk of breast cancer should talk to their health care providers about having an MRI and a mammogram every year.  Breast cancer gene (BRCA)-related cancer risk assessment is recommended for women who have family members with BRCA-related cancers. BRCA-related cancers include breast, ovarian, tubal, and peritoneal cancers. Having family members with these cancers may be associated with an increased risk for harmful changes (mutations) in the breast cancer genes BRCA1 and BRCA2. Results of the assessment will determine the need for genetic counseling and BRCA1 and BRCA2 testing.  Routine pelvic exams to screen for cancer are no longer recommended for nonpregnant women who are considered low risk for cancer of the pelvic organs (ovaries, uterus, and vagina) and who do not have symptoms. Ask your health care provider if a screening pelvic exam is right for you.  If you have had past treatment for cervical cancer or a condition that could lead to cancer, you need Pap tests and screening for cancer for at least 20  years after your treatment. If Pap tests have been discontinued, your risk factors (such as having a new sexual partner) need to be reassessed to determine if screening should be resumed. Some women have medical problems that increase the chance of getting cervical cancer. In these cases, your health care provider may recommend more frequent screening and Pap tests.  Colorectal cancer can be detected and often prevented. Most routine colorectal cancer screening begins at the age of 50 years and continues through age 75 years. However, your health care provider may recommend screening at an earlier age if you have risk factors for colon cancer. On a yearly basis, your health care provider may provide home test kits to check for hidden blood in the stool. Use of a small camera at the end of a tube, to directly examine the colon (sigmoidoscopy or colonoscopy), can detect the earliest forms of colorectal cancer. Talk to your health care provider about this at age 50, when routine screening begins. Direct exam of the colon should be repeated every 5-10 years through age 75 years, unless early forms of pre-cancerous   polyps or small growths are found.  Hepatitis C blood testing is recommended for all people born from 1945 through 1965 and any individual with known risks for hepatitis C.  Pra  Osteoporosis is a disease in which the bones lose minerals and strength with aging. This can result in serious bone fractures or breaks. The risk of osteoporosis can be identified using a bone density scan. Women ages 65 years and over and women at risk for fractures or osteoporosis should discuss screening with their health care providers. Ask your health care provider whether you should take a calcium supplement or vitamin D to reduce the rate of osteoporosis.  Menopause can be associated with physical symptoms and risks. Hormone replacement therapy is available to decrease symptoms and risks. You should talk to your  health care provider about whether hormone replacement therapy is right for you.  Use sunscreen. Apply sunscreen liberally and repeatedly throughout the day. You should seek shade when your shadow is shorter than you. Protect yourself by wearing long sleeves, pants, a wide-brimmed hat, and sunglasses year round, whenever you are outdoors.  Once a month, do a whole body skin exam, using a mirror to look at the skin on your back. Tell your health care provider of new moles, moles that have irregular borders, moles that are larger than a pencil eraser, or moles that have changed in shape or color.  Stay current with required vaccines (immunizations).  Influenza vaccine. All adults should be immunized every year.  Tetanus, diphtheria, and acellular pertussis (Td, Tdap) vaccine. Pregnant women should receive 1 dose of Tdap vaccine during each pregnancy. The dose should be obtained regardless of the length of time since the last dose. Immunization is preferred during the 27th-36th week of gestation. An adult who has not previously received Tdap or who does not know her vaccine status should receive 1 dose of Tdap. This initial dose should be followed by tetanus and diphtheria toxoids (Td) booster doses every 10 years. Adults with an unknown or incomplete history of completing a 3-dose immunization series with Td-containing vaccines should begin or complete a primary immunization series including a Tdap dose. Adults should receive a Td booster every 10 years.  Varicella vaccine. An adult without evidence of immunity to varicella should receive 2 doses or a second dose if she has previously received 1 dose. Pregnant females who do not have evidence of immunity should receive the first dose after pregnancy. This first dose should be obtained before leaving the health care facility. The second dose should be obtained 4-8 weeks after the first dose.  Human papillomavirus (HPV) vaccine. Females aged 13-26 years  who have not received the vaccine previously should obtain the 3-dose series. The vaccine is not recommended for use in pregnant females. However, pregnancy testing is not needed before receiving a dose. If a male is found to be pregnant after receiving a dose, no treatment is needed. In that case, the remaining doses should be delayed until after the pregnancy. Immunization is recommended for any person with an immunocompromised condition through the age of 26 years if she did not get any or all doses earlier. During the 3-dose series, the second dose should be obtained 4-8 weeks after the first dose. The third dose should be obtained 24 weeks after the first dose and 16 weeks after the second dose.  Zoster vaccine. One dose is recommended for adults aged 60 years or older unless certain conditions are present.  Measles, mumps, and rubella (  MMR) vaccine. Adults born before [birth year] generally are considered immune to measles and mumps. Adults born in [birth year] or later should have 1 or more doses of MMR vaccine unless there is a contraindication to the vaccine or there is laboratory evidence of immunity to each of the three diseases. A routine second dose of MMR vaccine should be obtained at least 28 days after the first dose for students attending postsecondary schools, health care workers, or international travelers. People who received inactivated measles vaccine or an unknown type of measles vaccine during 1963-1967 should receive 2 doses of MMR vaccine. People who received inactivated mumps vaccine or an unknown type of mumps vaccine before 1979 and are at high risk for mumps infection should consider immunization with 2 doses of MMR vaccine. For females of childbearing age, rubella immunity should be determined. If there is no evidence of immunity, females who are not pregnant should be vaccinated. If there is no evidence of immunity, females who are pregnant should delay immunization until after pregnancy.  Unvaccinated health care workers born before [birth year] who lack laboratory evidence of measles, mumps, or rubella immunity or laboratory confirmation of disease should consider measles and mumps immunization with 2 doses of MMR vaccine or rubella immunization with 1 dose of MMR vaccine.  Pneumococcal 13-valent conjugate (PCV13) vaccine. When indicated, a person who is uncertain of her immunization history and has no record of immunization should receive the PCV13 vaccine. An adult aged 39 years or older who has certain medical conditions and has not been previously immunized should receive 1 dose of PCV13 vaccine. This PCV13 should be followed with a dose of pneumococcal polysaccharide (PPSV23) vaccine. The PPSV23 vaccine dose should be obtained at least 8 weeks after the dose of PCV13 vaccine. An adult aged 62 years or older who has certain medical conditions and previously received 1 or more doses of PPSV23 vaccine should receive 1 dose of PCV13. The PCV13 vaccine dose should be obtained 1 or more years after the last PPSV23 vaccine dose.    Pneumococcal polysaccharide (PPSV23) vaccine. When PCV13 is also indicated, PCV13 should be obtained first. All adults aged 67 years and older should be immunized. An adult younger than age 45 years who has certain medical conditions should be immunized. Any person who resides in a nursing home or long-term care facility should be immunized. An adult smoker should be immunized. People with an immunocompromised condition and certain other conditions should receive both PCV13 and PPSV23 vaccines. People with human immunodeficiency virus (HIV) infection should be immunized as soon as possible after diagnosis. Immunization during chemotherapy or radiation therapy should be avoided. Routine use of PPSV23 vaccine is not recommended for American Indians, [place] Natives, or people younger than 65 years unless there are medical conditions that require PPSV23 vaccine. When indicated,  people who have unknown immunization and have no record of immunization should receive PPSV23 vaccine. One-time revaccination 5 years after the first dose of PPSV23 is recommended for people aged 19-64 years who have chronic kidney failure, nephrotic syndrome, asplenia, or immunocompromised conditions. People who received 1-2 doses of PPSV23 before age 23 years should receive another dose of PPSV23 vaccine at age 35 years or later if at least 5 years have passed since the previous dose. Doses of PPSV23 are not needed for people immunized with PPSV23 at or after age 38 years.  Preventive Services / Frequency   Ages 43 to 86 years  Blood pressure check.  Lipid and cholesterol check.  Lung  cancer screening. / Every year if you are aged 64-80 years and have a 30-pack-year history of smoking and currently smoke or have quit within the past 15 years. Yearly screening is stopped once you have quit smoking for at least 15 years or develop a health problem that would prevent you from having lung cancer treatment.  Clinical breast exam.** / Every year after age 36 years.  BRCA-related cancer risk assessment.** / For women who have family members with a BRCA-related cancer (breast, ovarian, tubal, or peritoneal cancers).  Mammogram.** / Every year beginning at age 109 years and continuing for as long as you are in good health. Consult with your health care provider.  Pap test.** / Every 3 years starting at age 39 years through age 65 or 28 years with a history of 3 consecutive normal Pap tests.  HPV screening.** / Every 3 years from ages 91 years through ages 32 to 78 years with a history of 3 consecutive normal Pap tests.  Fecal occult blood test (FOBT) of stool. / Every year beginning at age 26 years and continuing until age 12 years. You may not need to do this test if you get a colonoscopy every 10 years.  Flexible sigmoidoscopy or colonoscopy.** / Every 5 years for a flexible sigmoidoscopy or  every 10 years for a colonoscopy beginning at age 34 years and continuing until age 60 years.  Hepatitis C blood test.** / For all people born from [birth year] through 1965 and any individual with known risks for hepatitis C.  Skin self-exam. / Monthly.  Influenza vaccine. / Every year.  Tetanus, diphtheria, and acellular pertussis (Tdap/Td) vaccine.** / Consult your health care provider. Pregnant women should receive 1 dose of Tdap vaccine during each pregnancy. 1 dose of Td every 10 years.  Varicella vaccine.** / Consult your health care provider. Pregnant females who do not have evidence of immunity should receive the first dose after pregnancy.  Zoster vaccine.** / 1 dose for adults aged 11 years or older.  Pneumococcal 13-valent conjugate (PCV13) vaccine.** / Consult your health care provider.  Pneumococcal polysaccharide (PPSV23) vaccine.** / 1 to 2 doses if you smoke cigarettes or if you have certain conditions.  Meningococcal vaccine.** / Consult your health care provider.  Hepatitis A vaccine.** / Consult your health care provider.  Hepatitis B vaccine.** / Consult your health care provider. Screening for abdominal aortic aneurysm (AAA)  by ultrasound is recommended for people over 50 who have history of high blood pressure or who are current or former smokers. ++++++++++++++++++ Recommend Adult Low Dose Aspirin or  coated  Aspirin 81 mg daily  To reduce risk of Colon Cancer 20 %,  Skin Cancer 26 % ,  Melanoma 46%  and  Pancreatic cancer 60% +++++++++++++++++++ Vitamin D goal  is between 70-100.  Please make sure that you are taking your Vitamin D as directed.  It is very important as a natural anti-inflammatory  helping hair, skin, and nails, as well as reducing stroke and heart attack risk.  It helps your bones and helps with mood. It also decreases numerous cancer risks so please take it as directed.  Low Vit D is associated with a 200-300% higher risk for CANCER  and  200-300% higher risk for HEART   ATTACK  &  STROKE.   .....................................Marland Kitchen It is also associated with higher death rate at younger ages,  autoimmune diseases like Rheumatoid arthritis, Lupus, Multiple Sclerosis.    Also many other serious conditions, like depression,  Alzheimer's Dementia, infertility, muscle aches, fatigue, fibromyalgia - just to name a few. ++++++++++++++++++ Recommend the book "The END of DIETING" by Dr Excell Seltzer  & the book "The END of DIABETES " by Dr Excell Seltzer At [email or web address] - get book & Audio CD's    Being diabetic has a  300% increased risk for heart attack, stroke, cancer, and alzheimer- type vascular dementia. It is very important that you work harder with diet by avoiding all foods that are white. Avoid white rice (brown & wild rice is OK), white potatoes (sweetpotatoes in moderation is OK), White bread or wheat bread or anything made out of white flour like bagels, donuts, rolls, buns, biscuits, cakes, pastries, cookies, pizza crust, and pasta (made from white flour & egg whites) - vegetarian pasta or spinach or wheat pasta is OK. Multigrain breads like Arnold's or [employer], or multigrain sandwich thins or flatbreads.  Diet, exercise and weight loss can reverse and cure diabetes in the early stages.  Diet, exercise and weight loss is very important in the control and prevention of complications of diabetes which affects every system in your body, ie. Brain - dementia/stroke, eyes - glaucoma/blindness, heart - heart attack/heart failure, kidneys - dialysis, stomach - gastric paralysis, intestines - malabsorption, nerves - severe painful neuritis, circulation - gangrene & loss of a leg(s), and finally cancer and Alzheimers.    I recommend avoid fried & greasy foods,  sweets/candy, white rice (brown or wild rice or Quinoa is OK), white potatoes (sweet potatoes are OK) - anything made from white flour - bagels, doughnuts, rolls, buns, biscuits,white  and wheat breads, pizza crust and traditional pasta made of white flour & egg white(vegetarian pasta or spinach or wheat pasta is OK).  Multi-grain bread is OK - like multi-grain flat bread or sandwich thins. Avoid alcohol in excess. Exercise is also important.    Eat all the vegetables you want - avoid meat, especially red meat and dairy - especially cheese.  Cheese is the most concentrated form of trans-fats which is the worst thing to clog up our arteries. Veggie cheese is OK which can be found in the fresh produce section at [employer] or [employer] or [employer]  ++++++++++++++++++++++ DASH Eating Plan  DASH stands for "Dietary Approaches to Stop Hypertension."   The DASH eating plan is a healthy eating plan that has been shown to reduce high blood pressure (hypertension). Additional health benefits may include reducing the risk of type 2 diabetes mellitus, heart disease, and stroke. The DASH eating plan may also help with weight loss. WHAT DO I NEED TO KNOW ABOUT THE DASH EATING PLAN? For the DASH eating plan, you will follow these general guidelines:  Choose foods with a percent daily value for sodium of less than 5% (as listed on the food label).  Use salt-free seasonings or herbs instead of table salt or sea salt.  Check with your health care provider or pharmacist before using salt substitutes.  Eat lower-sodium products, often labeled as "lower sodium" or "no salt added."  Eat fresh foods.  Eat more vegetables, fruits, and low-fat dairy products.  Choose whole grains. Look for the word "whole" as the first word in the ingredient list.  Choose fish   Limit sweets, desserts, sugars, and sugary drinks.  Choose heart-healthy fats.  Eat veggie cheese   Eat more home-cooked food and less restaurant, buffet, and fast food.  Limit fried foods.  Cook foods using methods other than frying.  Limit canned  vegetables. If you do use them, rinse them well to decrease the  sodium.  When eating at a restaurant, ask that your food be prepared with less salt, or no salt if possible.                      WHAT FOODS CAN I EAT? Read Dr Fara Olden Fuhrman's books on The End of Dieting & The End of Diabetes  Grains Whole grain or whole wheat bread. Brown rice. Whole grain or whole wheat pasta. Quinoa, bulgur, and whole grain cereals. Low-sodium cereals. Corn or whole wheat flour tortillas. Whole grain cornbread. Whole grain crackers. Low-sodium crackers.  Vegetables Fresh or frozen vegetables (raw, steamed, roasted, or grilled). Low-sodium or reduced-sodium tomato and vegetable juices. Low-sodium or reduced-sodium tomato sauce and paste. Low-sodium or reduced-sodium canned vegetables.   Fruits All fresh, canned (in natural juice), or frozen fruits.  Protein Products  All fish and seafood.  Dried beans, peas, or lentils. Unsalted nuts and seeds. Unsalted canned beans.  Dairy Low-fat dairy products, such as skim or 1% milk, 2% or reduced-fat cheeses, low-fat ricotta or cottage cheese, or plain low-fat yogurt. Low-sodium or reduced-sodium cheeses.  Fats and Oils Tub margarines without trans fats. Light or reduced-fat mayonnaise and salad dressings (reduced sodium). Avocado. Safflower, olive, or canola oils. Natural peanut or almond butter.  Other Unsalted popcorn and pretzels. The items listed above may not be a complete list of recommended foods or beverages. Contact your dietitian for more options.  ++++++++++++++++++  WHAT FOODS ARE NOT RECOMMENDED? Grains/ White flour or wheat flour White bread. White pasta. White rice. Refined cornbread. Bagels and croissants. Crackers that contain trans fat.  Vegetables  Creamed or fried vegetables. Vegetables in a . Regular canned vegetables. Regular canned tomato sauce and paste. Regular tomato and vegetable juices.  Fruits Dried fruits. Canned fruit in light or heavy syrup. Fruit juice.  Meat and Other Protein  Products Meat in general - RED meat & White meat.  Fatty cuts of meat. Ribs, chicken wings, all processed meats as bacon, sausage, bologna, salami, fatback, hot dogs, bratwurst and packaged luncheon meats.  Dairy Whole or 2% milk, cream, half-and-half, and cream cheese. Whole-fat or sweetened yogurt. Full-fat cheeses or blue cheese. Non-dairy creamers and whipped toppings. Processed cheese, cheese spreads, or cheese curds.  Condiments Onion and garlic salt, seasoned salt, table salt, and sea salt. Canned and packaged gravies. Worcestershire sauce. Tartar sauce. Barbecue sauce. Teriyaki sauce. Soy sauce, including reduced sodium. Steak sauce. Fish sauce. Oyster sauce. Cocktail sauce. Horseradish. Ketchup and mustard. Meat flavorings and tenderizers. Bouillon cubes. Hot sauce. Tabasco sauce. Marinades. Taco seasonings. Relishes.  Fats and Oils Butter, stick margarine, lard, shortening and bacon fat. Coconut, palm kernel, or palm oils. Regular salad dressings.  Pickles and olives. Salted popcorn and pretzels.  The items listed above may not be a complete list of foods and beverages to avoid.

## 2017-05-28 NOTE — Progress Notes (Signed)
[facility]   Unk Pinto, M.D.     Uvaldo Bristle. Silverio Lay, P.A.-C [name], [place]                [address] [place], [place] [address] Telephone [phone] Telefax [phone] Annual  Screening/Preventative Visit  & Comprehensive Evaluation & Examination     This very nice 57 y.o. DWMpresents for a Screening/Preventative Visit & comprehensive evaluation and management of multiple medical co-morbidities.  Patient has been followed for HTN, ASCAD, HLD, Prediabetes and Vitamin D Deficiency.     HTN predates since  2017. Patient's BP has been controlled at home.  Today's BP is elevated at 168 /102 and rechecked at 186/117. In May 2017, he presented with ACS & underwent PCA/w/DES implanted for a 90% mid LAD by Dr Marlou Porch.  He has done well since that time and relates that he has been released from further follow-up. He denies any cardiac symptoms as chest pain, palpitations, shortness of breath, dizziness or ankle swelling.     Patient's hyperlipidemia is controlled with diet and medications. Patient denies myalgias or other medication SE's. Last lipids were at goal: Lab Results  Component Value Date   CHOL 154 02/26/2017   HDL 82 02/26/2017   LDLCALC 29 05/10/2016   TRIG 98 02/26/2017   CHOLHDL 1.9 02/26/2017      Patient is monitored expectantly for  prediabetes and patient denies reactive hypoglycemic symptoms, visual blurring, diabetic polys or paresthesias. Last A1c was Normal & at goal: Lab Results  Component Value Date   HGBA1C 5.2 11/14/2016       Finally, patient has history of Vitamin D Deficiency  and last vitamin D was very low at 61" in Aug 2018.  Current Outpatient Medications on File Prior to Visit  Medication Sig  . aspirin EC 81 MG tablet Take 1 tablet (81 mg total) by mouth daily.  Marland Kitchen atenolol (TENORMIN) 25 MG tablet TAKE 1 TABLET BY MOUTH EVERY DAY  . atorvastatin  (LIPITOR) 40 MG tablet TAKE 1 TABLET DAILY FOR CHOLESTEROL  . nitroGLYCERIN (NITROSTAT) 0.4 MG SL tablet Place 1 tablet (0.4 mg total) under the tongue every 5 (five) minutes as needed for chest pain.  . pantoprazole (PROTONIX) 40 MG tablet TAKE 1 TABLET BY MOUTH EVERY DAY FOR HEARTBURN AND INDIGESTION  . ranitidine (ZANTAC) 300 MG tablet Once at night time  . losartan (COZAAR) 25 MG tablet Take 1 tablet (25 mg total) by mouth daily.   No current facility-administered medications on file prior to visit.    No Known Allergies   Past Medical History:  Diagnosis Date  . Coronary artery disease    a. Cath ~ 25 yrs ago, dx w/ pleurisy;  b. 07/2015 Cath/PCI: LM nl, LAD 54m (2.75x24 Synergy DES), LCX nl, RCA nl.  Marland Kitchen GERD (gastroesophageal reflux disease)   . Hypertensive heart disease   . Tobacco abuse    Health Maintenance  Topic Date Due  . Hepatitis C Screening  16-Dec-1960  . HIV Screening  06/13/1976  . COLONOSCOPY  06/14/2011  . INFLUENZA VACCINE  10/24/2016  . TETANUS/TDAP  01/24/2026   Immunization History  Administered Date(s) Administered  . Tdap 01/25/2016   Last Colon -  Past Surgical History:  Procedure Laterality Date  . BACK SURGERY    . CARDIAC CATHETERIZATION N/A 08/11/2015   Procedure: Left  Heart Cath and Coronary Angiography;  Surgeon: Jerline Pain, MD;  Location: [facility];  Service: Cardiovascular;  Laterality: N/A;  . CARDIAC CATHETERIZATION N/A 08/11/2015   Procedure: Coronary Stent Intervention;  Surgeon: Jerline Pain, MD;  Location: [facility];  Service: Cardiovascular;  Laterality: N/A;  . CARDIAC CATHETERIZATION N/A 08/11/2015   Procedure: Coronary Stent Intervention;  Surgeon: Jettie Booze, MD;  Location: [facility];  Service: Cardiovascular;  Laterality: N/A;  . LUMBAR DISC SURGERY     Family History  Problem Relation Age of Onset  . Stroke Father    Socioeconomic History  . Marital status: Legally Separated    Spouse  name: none  Occupational History  . Not on file  Tobacco Use  . Smoking status: Current Every Day Smoker    Packs/day: 1.50    Years: 37.00    Pack years: 55.50    Types: Cigarettes  . Smokeless tobacco: Never Used  Substance and Sexual Activity  . Alcohol use: Yes    Alcohol/week: 24.0 oz    Types: 40 Shots of liquor per week    Comment: 08/11/2015 drinks 3-4 fifths of liquor a week  . Drug use: No  . Sexual activity: Not on file  Other Topics Concern  . Not on file  Social History Narrative  . Not on file    ROS Constitutional: Denies fever, chills, weight loss/gain, headaches, insomnia,  night sweats or change in appetite. Does c/o fatigue. Eyes: Denies redness, blurred vision, diplopia, discharge, itchy or watery eyes.  ENT: Denies discharge, congestion, post nasal drip, epistaxis, sore throat, earache, hearing loss, dental pain, Tinnitus, Vertigo, Sinus pain or snoring.  Cardio: Denies chest pain, palpitations, irregular heartbeat, syncope, dyspnea, diaphoresis, orthopnea, PND, claudication or edema Respiratory: denies cough, dyspnea, DOE, pleurisy, hoarseness, laryngitis or wheezing.  Gastrointestinal: Denies dysphagia, heartburn, reflux, water brash, pain, cramps, nausea, vomiting, bloating, diarrhea, constipation, hematemesis, melena, hematochezia, jaundice or hemorrhoids Genitourinary: Denies dysuria, frequency, urgency, nocturia, hesitancy, discharge, hematuria or flank pain Musculoskeletal: Denies arthralgia, myalgia, stiffness, Jt. Swelling, pain, limp or strain/sprain. Denies Falls. Skin: Denies puritis, rash, hives, warts, acne, eczema or change in skin lesion Neuro: No weakness, tremor, incoordination, spasms, paresthesia or pain Psychiatric: Denies confusion, memory loss or sensory loss. Denies Depression. Endocrine: Denies change in weight, skin, hair change, nocturia, and paresthesia, diabetic polys, visual blurring or hyper / hypo glycemic episodes.  Heme/Lymph:  No excessive bleeding, bruising or enlarged lymph nodes.  Physical Exam  BP (!) 168/102   Pulse (!) 56   Temp (!) 97.5 F (36.4 C)   Resp 16   Ht 6' 3.5" (1.918 m)   Wt 192 lb 3.2 oz (87.2 kg)   BMI 23.71 kg/m    Rechecked BP at 186/117 Rt Arm cuff  General Appearance: Well nourished and well groomed and in no apparent distress.  Eyes: PERRLA, EOMs, conjunctiva no swelling or erythema, normal fundi and vessels. Sinuses: No frontal/maxillary tenderness ENT/Mouth: EACs patent / TMs  nl. Nares clear without erythema, swelling, mucoid exudates. Oral hygiene is good. No erythema, swelling, or exudate. Tongue normal, non-obstructing. Tonsils not swollen or erythematous. Hearing normal.  Neck: Supple, thyroid not palpable. No bruits, nodes or JVD. Respiratory: Respiratory effort normal.  BS equal and clear bilateral without rales, rhonci, wheezing or stridor. Cardio: Heart sounds are normal with regular rate and rhythm and no murmurs, rubs or gallops. Peripheral pulses are normal and equal bilaterally without edema. No aortic or  femoral bruits. Chest: symmetric with normal excursions and percussion.  Abdomen: Soft, with Nl bowel sounds. Nontender, no guarding, rebound, hernias, masses, or organomegaly.  Lymphatics: Non tender without lymphadenopathy.  Genitourinary: No hernias.Testes nl. DRE - prostate nl for age - smooth & firm w/o nodules. Musculoskeletal: Full ROM all peripheral extremities, joint stability, 5/5 strength, and normal gait. Skin: Warm and dry without rashes, lesions, cyanosis, clubbing or  ecchymosis.  Neuro: Cranial nerves intact, reflexes equal bilaterally. Normal muscle tone, no cerebellar symptoms. Sensation intact.  Pysch: Alert and oriented X 3 with normal affect, insight and judgment appropriate.   Assessment and Plan  1. Annual Preventative/Screening Exam   2. Essential hypertension  -  BP not controlled and advised to increase Atenolol 25 mg x 2 tabs = 50 mg  and also increase Losartan 25 mg x 2 tablets =50 mg  Daily & return in 7-10 days to recheck.   - EKG 12-Lead - [place], RETROPERITNL ABD,  LTD - Urinalysis, Routine w reflex microscopic - Microalbumin / creatinine urine ratio - CBC with Differential/Platelet - BASIC METABOLIC PANEL WITH GFR - Magnesium - TSH  3. Hyperlipidemia, mixed  - EKG 12-Lead - [place], RETROPERITNL ABD,  LTD - Hepatic function panel - Lipid panel - TSH  4. Prediabetes  - EKG 12-Lead - [place], RETROPERITNL ABD,  LTD - Hemoglobin A1c - Insulin, random  5. Vitamin D deficiency   6. Coronary artery disease involving native coronary artery of native heart without angina pectoris  - EKG 12-Lead - Lipid panel  7. Gastroesophageal reflux disease  - CBC with Differential/Platelet  8. Encounter for screening for malignant neoplasm of respiratory organs  9. Screening for ischemic heart disease  - EKG 12-Lead  10. Screening for AAA (aortic abdominal aneurysm)  - [place], RETROPERITNL ABD,  LTD  11. Screening for colorectal cancer  - POC Hemoccult Bld/Stl  12. Screening for prostate cancer  - PSA  13. Smoker  - EKG 12-Lead - [place], RETROPERITNL ABD,  LTD  14. FH: cardiovascular disease  - EKG 12-Lead - [place], RETROPERITNL ABD,  LTD  15. Fatigue  - Iron,Total/Total Iron Binding Cap - Vitamin B12 - Testosterone - CBC with Differential/Platelet  16. Medication management  - Urinalysis, Routine w reflex microscopic - Microalbumin / creatinine urine ratio - CBC with Differential/Platelet - BASIC METABOLIC PANEL WITH GFR - Hepatic function panel - Magnesium - Lipid panel - TSH - Hemoglobin A1c - Insulin, random        Patient was counseled in prudent diet, weight control to achieve/maintain BMI less than 25, BP monitoring, regular exercise and medications as discussed.  Discussed med effects and SE's. Routine screening labs and tests as requested with regular follow-up as recommended. Over 40 minutes  of exam, counseling, chart review and high complex critical decision making was performed

## 2017-05-29 LAB — HEPATIC FUNCTION PANEL
AG RATIO: 1.8 (calc) (ref 1.0–2.5)
ALT: 34 U/L (ref 9–46)
AST: 27 U/L (ref 10–35)
Albumin: 4.6 g/dL (ref 3.6–5.1)
Alkaline phosphatase (APISO): 71 U/L (ref 40–115)
BILIRUBIN TOTAL: 0.6 mg/dL (ref 0.2–1.2)
Bilirubin, Direct: 0.2 mg/dL (ref 0.0–0.2)
GLOBULIN: 2.5 g/dL (ref 1.9–3.7)
Indirect Bilirubin: 0.4 mg/dL (calc) (ref 0.2–1.2)
TOTAL PROTEIN: 7.1 g/dL (ref 6.1–8.1)

## 2017-05-29 LAB — CBC WITH DIFFERENTIAL/PLATELET
BASOS PCT: 1.3 %
Basophils Absolute: 70 cells/uL (ref 0–200)
EOS PCT: 3.2 %
Eosinophils Absolute: 173 cells/uL (ref 15–500)
HCT: 44.5 % (ref 38.5–50.0)
HEMOGLOBIN: 15.3 g/dL (ref 13.2–17.1)
LYMPHS ABS: 1836 {cells}/uL (ref 850–3900)
MCH: 31.5 pg (ref 27.0–33.0)
MCHC: 34.4 g/dL (ref 32.0–36.0)
MCV: 91.6 fL (ref 80.0–100.0)
MPV: 10.8 fL (ref 7.5–12.5)
Monocytes Relative: 7.9 %
NEUTROS ABS: 2894 {cells}/uL (ref 1500–7800)
NEUTROS PCT: 53.6 %
Platelets: 196 10*3/uL (ref 140–400)
RBC: 4.86 10*6/uL (ref 4.20–5.80)
RDW: 13.1 % (ref 11.0–15.0)
Total Lymphocyte: 34 %
WBC: 5.4 10*3/uL (ref 3.8–10.8)
WBCMIX: 427 {cells}/uL (ref 200–950)

## 2017-05-29 LAB — LIPID PANEL
CHOL/HDL RATIO: 1.5 (calc) (ref <5.0)
CHOLESTEROL: 163 mg/dL (ref <200)
HDL: 107 mg/dL (ref >40)
LDL Cholesterol (Calc): 36 mg/dL (calc)
NON-HDL CHOLESTEROL (CALC): 56 mg/dL (ref <130)
TRIGLYCERIDES: 122 mg/dL (ref <150)

## 2017-05-29 LAB — IRON, TOTAL/TOTAL IRON BINDING CAP
%SAT: 32 % (ref 15–60)
Iron: 108 ug/dL (ref 50–180)
TIBC: 337 ug/dL (ref 250–425)

## 2017-05-29 LAB — URINALYSIS, ROUTINE W REFLEX MICROSCOPIC
Bilirubin Urine: NEGATIVE
Glucose, UA: NEGATIVE
Hgb urine dipstick: NEGATIVE
KETONES UR: NEGATIVE
LEUKOCYTES UA: NEGATIVE
NITRITE: NEGATIVE
PH: 6 (ref 5.0–8.0)
Protein, ur: NEGATIVE
Specific Gravity, Urine: 1.022 (ref 1.001–1.03)

## 2017-05-29 LAB — TESTOSTERONE: Testosterone: 333 ng/dL (ref 250–827)

## 2017-05-29 LAB — INSULIN, RANDOM: Insulin: 6.2 u[IU]/mL (ref 2.0–19.6)

## 2017-05-29 LAB — MICROALBUMIN / CREATININE URINE RATIO
CREATININE, URINE: 166 mg/dL (ref 20–320)
MICROALB UR: 0.7 mg/dL
MICROALB/CREAT RATIO: 4 ug/mg{creat} (ref <30)

## 2017-05-29 LAB — BASIC METABOLIC PANEL WITH GFR
BUN: 12 mg/dL (ref 7–25)
CO2: 29 mmol/L (ref 20–32)
CREATININE: 1.19 mg/dL (ref 0.70–1.33)
Calcium: 9.4 mg/dL (ref 8.6–10.3)
Chloride: 104 mmol/L (ref 98–110)
GFR, EST NON AFRICAN AMERICAN: 68 mL/min/{1.73_m2} (ref >=60)
GFR, Est African American: 79 mL/min/{1.73_m2} (ref >=60)
GLUCOSE: 79 mg/dL (ref 65–99)
Potassium: 4.1 mmol/L (ref 3.5–5.3)
Sodium: 141 mmol/L (ref 135–146)

## 2017-05-29 LAB — PSA: PSA: 0.5 ng/mL (ref <=4.0)

## 2017-05-29 LAB — VITAMIN B12: VITAMIN B 12: 291 pg/mL (ref 200–1100)

## 2017-05-29 LAB — HEMOGLOBIN A1C
Hgb A1c MFr Bld: 5.4 % of total Hgb (ref <5.7)
Mean Plasma Glucose: 108 (calc)
eAG (mmol/L): 6 (calc)

## 2017-05-29 LAB — MAGNESIUM: Magnesium: 1.9 mg/dL (ref 1.5–2.5)

## 2017-05-29 LAB — TSH: TSH: 0.56 mIU/L (ref 0.40–4.50)

## 2017-06-07 ENCOUNTER — Ambulatory Visit: Payer: Self-pay | Admitting: Internal Medicine

## 2017-06-10 ENCOUNTER — Encounter: Payer: Self-pay | Admitting: Physician Assistant

## 2017-06-10 ENCOUNTER — Ambulatory Visit: Payer: 59 | Admitting: Physician Assistant

## 2017-06-10 VITALS — BP 154/90 | HR 64 | Temp 97.9°F | Resp 16 | Ht 75.5 in | Wt 190.6 lb

## 2017-06-10 DIAGNOSIS — I10 Essential (primary) hypertension: Secondary | ICD-10-CM

## 2017-06-10 DIAGNOSIS — K219 Gastro-esophageal reflux disease without esophagitis: Secondary | ICD-10-CM

## 2017-06-10 DIAGNOSIS — F17200 Nicotine dependence, unspecified, uncomplicated: Secondary | ICD-10-CM

## 2017-06-10 MED ORDER — LOSARTAN POTASSIUM 50 MG PO TABS: 50.0000 mg | ORAL_TABLET | Freq: Every day | ORAL | 1 refills | Status: DC

## 2017-06-10 NOTE — Patient Instructions (Signed)
Do the losartan in the morning 50mg  Do the atenolol 25mg  at night  Monitor BP and keep your arm/BP cuff at heart level.   Please read the triggers and the foods that can contribute to heart burn.   GETTING OFF OF PPI's    Nexium/protonix/prilosec/Omeprazole/Dexilant/Aciphex are called PPI's, they are great at healing your stomach but should only be taken for a short period of time.     Recent studies have shown that taken for a long time they  can increase the risk of osteoporosis (weakening of your bones), pneumonia, low magnesium, restless legs, Cdiff (infection that causes diarrhea), DEMENTIA and most recently kidney damage / disease / insufficiency.     Due to this information we want to try to stop the PPI but if you try to stop it abruptly this can cause rebound acid and worsening symptoms.   So this is how we want you to get off the PPI: Generic is always fine!!  - Start taking the nexium/protonix/prilosec/PPI  every other day with  zantac (ranitidine) OR pepcid famotadine 2 x a day for 2-4 weeks - some people stay on this dosage and can not taper off further. Our main goal is to limit the dosage and amount you are taking so if you need to stay on this dose.   - then decrease the PPI to every 3 days while taking the zantac or pepcid 300mg  twice a day the other  days for 2-4  Weeks  - then you can try the zantac or pepcid 300mg  once at night or up to 2 x day as needed.  - you can continue on this once at night or stop all together  - Avoid alcohol, spicy foods, NSAIDS (aleve, ibuprofen) at this time. See foods below.   +++++++++++++++++++++++++++++++++++++++++++  Food Choices for Gastroesophageal Reflux Disease  When you have gastroesophageal reflux disease (GERD), the foods you eat and your eating habits are very important. Choosing the right foods can help ease the discomfort of GERD. WHAT GENERAL GUIDELINES DO I NEED TO FOLLOW?  Choose fruits, vegetables, whole grains,  low-fat dairy products, and low-fat meat, fish, and poultry.  Limit fats such as oils, salad dressings, butter, nuts, and avocado.  Keep a food diary to identify foods that cause symptoms.  Avoid foods that cause reflux. These may be different for different people.  Eat frequent small meals instead of three large meals each day.  Eat your meals slowly, in a relaxed setting.  Limit fried foods.  Cook foods using methods other than frying.  Avoid drinking alcohol.  Avoid drinking large amounts of liquids with your meals.  Avoid bending over or lying down until 2-3 hours after eating.   WHAT FOODS ARE NOT RECOMMENDED? The following are some foods and drinks that may worsen your symptoms:  Vegetables Tomatoes. Tomato juice. Tomato and spaghetti sauce. Chili peppers. Onion and garlic. Horseradish. Fruits Oranges, grapefruit, and lemon (fruit and juice). Meats High-fat meats, fish, and poultry. This includes hot dogs, ribs, ham, sausage, salami, and bacon. Dairy Whole milk and chocolate milk. Sour cream. Cream. Butter. Ice cream. Cream cheese.  Beverages Coffee and tea, with or without caffeine. Carbonated beverages or energy drinks. Condiments Hot sauce. Barbecue sauce.  Sweets/Desserts Chocolate and cocoa. Donuts. Peppermint and spearmint. Fats and Oils High-fat foods, including [place] fries and potato chips. Other Vinegar. Strong spices, such as black pepper, white pepper, red pepper, cayenne, curry powder, cloves, ginger, and chili powder.   If you have  a smart phone, please look up Smoke Free app, this will help you stay on track and give you information about money you have saved, life that you have gained back and a ton of more information.   We are giving you chantix for smoking cessation. You can do it! And we are here to help! You may have heard some scary side effects about chantix, the three most common I hear about are nausea, crazy dreams and depression.   However, I like for my patients to try to stay on 1/2 a tablet twice a day rather than one tablet twice a day as normally prescribed. This helps decrease the chances of side effects and helps save money by making a one month prescription last two months  Please start the prescription this way:  Start 1/2 tablet by mouth once daily after food with a full glass of water for 3 days Then do 1/2 tablet by mouth twice daily for 4 days. During this first week you can smoke, but try to stop after this week.  At this point we have several options: 1) continue on 1/2 tablet twice a day- which I encourage you to do. You can stay on this dose the rest of the time on the medication or if you still feel the need to smoke you can do one of the two options below. 2) do one tablet in the morning and 1/2 in the evening which helps decrease dreams. 3) do one tablet twice a day.   What if I miss a dose? If you miss a dose, take it as soon as you can. If it is almost time for your next dose, take only that dose. Do not take double or extra doses.  What should I watch for while using this medicine? Visit your doctor or health care professional for regular check ups. Ask for ongoing advice and encouragement from your doctor or healthcare professional, friends, and family to help you quit. If you smoke while on this medication, quit again  Your mouth may get dry. Chewing sugarless gum or hard candy, and drinking plenty of water may help. Contact your doctor if the problem does not go away or is severe.  You may get drowsy or dizzy. Do not drive, use machinery, or do anything that needs mental alertness until you know how this medicine affects you. Do not stand or sit up quickly, especially if you are an older patient.   The use of this medicine may increase the chance of suicidal thoughts or actions. Pay special attention to how you are responding while on this medicine. Any worsening of mood, or thoughts of suicide  or dying should be reported to your health care professional right away.  ADVANTAGES OF QUITTING SMOKING  Within 20 minutes, blood pressure decreases. Your pulse is at normal level.  After 8 hours, carbon monoxide levels in the blood return to normal. Your oxygen level increases.  After 24 hours, the chance of having a heart attack starts to decrease. Your breath, hair, and body stop smelling like smoke.  After 48 hours, damaged nerve endings begin to recover. Your sense of taste and smell improve.  After 72 hours, the body is virtually free of nicotine. Your bronchial tubes relax and breathing becomes easier.  After 2 to 12 weeks, lungs can hold more air. Exercise becomes easier and circulation improves.  After 1 year, the risk of coronary heart disease is cut in half.  After 5 years, the risk  of stroke falls to the same as a nonsmoker.  After 10 years, the risk of lung cancer is cut in half and the risk of other cancers decreases significantly.  After 15 years, the risk of coronary heart disease drops, usually to the level of a nonsmoker.  You will have extra money to spend on things other than cigarettes.

## 2017-06-10 NOTE — Progress Notes (Signed)
Assessment and Plan:  Essential hypertension Continue losartan 50 mg in the AM, forgot this AM Taught how to use BP cuff Switch atenolol 25mg  at night  Gastroesophageal reflux disease, esophagitis presence not specified Continue to taper off protonix ? Need Hpylori testing  Smoker Smoking cessation-  instruction/counseling given, counseled patient on the dangers of tobacco use, advised patient to stop smoking, and reviewed strategies to maximize success, patient not ready to quit at this time.    Future Appointments  Date Time Provider [place]  09/09/2017  3:30 PM [name], NP [facility] None  12/13/2017  9:30 AM Unk Pinto, MD [facility] None  06/26/2018 10:00 AM Unk Pinto, MD [facility] None     HPI 57 y.o. smoking WM with history of CAD, HTN presents for BP check.  He was seen for CPE 03/05 and his BP was elevated at that time, he had no HTN crisis symptoms, he was instructed to increase his atenolol to 50mg  and losartan to 50mg  daily and follow up today. He did not increase the atenolol, states that it makes him feel bad.  BP Readings from Last 3 Encounters:  06/10/17 (!) 154/90  05/28/17 (!) 168/102  02/26/17 110/70   He is trying to cut back on protonix, has been on it for 2 years, no known triggers. States was very severe pain, take breath away pain, has never been tested for Hpylori.   He has started 5000 IU vitamin D.   Past Medical History:  Diagnosis Date  . Coronary artery disease    a. Cath ~ 25 yrs ago, dx w/ pleurisy;  b. 07/2015 Cath/PCI: LM nl, LAD 34m (2.75x24 Synergy DES), LCX nl, RCA nl.  Marland Kitchen GERD (gastroesophageal reflux disease)   . Hypertensive heart disease   . Tobacco abuse      No Known Allergies  Current Outpatient Medications on File Prior to Visit  Medication Sig  . aspirin EC 81 MG tablet Take 1 tablet (81 mg total) by mouth daily.  Marland Kitchen atenolol (TENORMIN) 25 MG tablet TAKE 1 TABLET BY MOUTH EVERY DAY  .  atorvastatin (LIPITOR) 40 MG tablet TAKE 1 TABLET DAILY FOR CHOLESTEROL  . nitroGLYCERIN (NITROSTAT) 0.4 MG SL tablet Place 1 tablet (0.4 mg total) under the tongue every 5 (five) minutes as needed for chest pain.  . pantoprazole (PROTONIX) 40 MG tablet TAKE 1 TABLET BY MOUTH EVERY DAY FOR HEARTBURN AND INDIGESTION  . ranitidine (ZANTAC) 300 MG tablet Take 1 to 2 tablets daily for heartburn & reflux  . losartan (COZAAR) 25 MG tablet Take 1 tablet (25 mg total) by mouth daily.   No current facility-administered medications on file prior to visit.     ROS: all negative except above.   Physical Exam: Filed Weights   06/10/17 1404  Weight: 190 lb 9.6 oz (86.5 kg)   BP (!) 154/90 Comment: right arm  Pulse 64   Temp 97.9 F (36.6 C)   Resp 16   Ht 6' 3.5" (1.918 m)   Wt 190 lb 9.6 oz (86.5 kg)   SpO2 98%   BMI 23.51 kg/m  General Appearance: Well nourished, in no apparent distress. Eyes: PERRLA, EOMs, conjunctiva no swelling or erythema Sinuses: No Frontal/maxillary tenderness ENT/Mouth: Ext aud canals clear, TMs without erythema, bulging. No erythema, swelling, or exudate on post pharynx.  Tonsils not swollen or erythematous. Hearing normal.  Neck: Supple, thyroid normal.  Respiratory: Respiratory effort normal, BS equal bilaterally without rales, rhonchi, wheezing or stridor.  Cardio: RRR with no MRGs. Brisk peripheral pulses without edema.  Abdomen: Soft, + BS.  Non tender, no guarding, rebound, hernias, masses. Lymphatics: Non tender without lymphadenopathy.  Musculoskeletal: Full ROM, 5/5 strength, normal gait.  Skin: Warm, dry without rashes, lesions, ecchymosis.  Neuro: Cranial nerves intact. Normal muscle tone, no cerebellar symptoms. Sensation intact.  Psych: Awake and oriented X 3, normal affect, Insight and Judgment appropriate.     Vicie Mutters, PA-C 2:18 PM [facility]

## 2017-06-12 ENCOUNTER — Ambulatory Visit: Payer: Self-pay | Admitting: Internal Medicine

## 2017-07-04 ENCOUNTER — Other Ambulatory Visit: Payer: Self-pay | Admitting: Cardiology

## 2017-07-04 NOTE — Telephone Encounter (Signed)
Order Providers   Prescribing Provider Encounter Provider  Vicie Mutters, PA-C Vicie Mutters, PA-C  Supervision Information   Supervising Provider Type of Supervision  Unk Pinto, MD Supervision Required  Outpatient Medication Detail    Disp Refills Start End   losartan (COZAAR) 50 MG tablet 90 tablet 1 06/10/2017 06/10/2018   Sig - Route: Take 1 tablet (50 mg total) by mouth daily. - Oral   Sent to pharmacy as: losartan (COZAAR) 50 MG tablet   E-Prescribing Status: Receipt confirmed by pharmacy (06/10/2017 2:33 PM EDT)   Pharmacy   [id number] - [place], [place] - [address]

## 2017-07-11 ENCOUNTER — Other Ambulatory Visit: Payer: Self-pay | Admitting: Internal Medicine

## 2017-07-11 DIAGNOSIS — K219 Gastro-esophageal reflux disease without esophagitis: Secondary | ICD-10-CM

## 2017-07-16 ENCOUNTER — Other Ambulatory Visit: Payer: Self-pay | Admitting: Internal Medicine

## 2017-07-16 DIAGNOSIS — E782 Mixed hyperlipidemia: Secondary | ICD-10-CM

## 2017-08-27 ENCOUNTER — Other Ambulatory Visit: Payer: Self-pay | Admitting: Internal Medicine

## 2017-08-28 ENCOUNTER — Telehealth: Payer: Self-pay | Admitting: *Deleted

## 2017-08-28 NOTE — Telephone Encounter (Signed)
Patient called and states the Ranitidine 300 mg is not helping his GERD and is no on his insurance plan.  The cost is high for him. He states the Pantoprazole was more effective.  Per Dr Melford Aase, he can take the Pantoprazole in the AM and a Ranitidine at bedtime. He was also advised he can the Ranitidine at a much lower price by using a GoodRX coupon.

## 2017-09-08 NOTE — Progress Notes (Deleted)
FOLLOW UP  Assessment and Plan:   CAD Control blood pressure, cholesterol, glucose, increase exercise.  Continue current medications Followed by cardiology  Hypertension Well controlled with current medications Monitor blood pressure at home; patient to call if consistently greater than 130/80 Continue DASH diet.   Reminder to go to the ER if any CP, SOB, nausea, dizziness, severe HA, changes vision/speech, left arm numbness and tingling and jaw pain.  Cholesterol Continue medication Continue low cholesterol diet and exercise.  Check lipid panel.   Tobacco use Discussed risks associated with tobacco use and advised to reduce or quit Patient is not ready to do so, but advised to consider strongly Will follow up at the next visit  GERD Well managed on current medications Discussed diet, avoiding triggers and other lifestyle changes  Vitamin D Def Recommended supplementation after discussion of risks associated with low levels Defer checking level as patient has not supplemented ***  Continue diet and meds as discussed. Further disposition pending results of labs. Discussed med's effects and SE's.   Over 30 minutes of exam, counseling, chart review, and critical decision making was performed.   Future Appointments  Date Time Provider [place]  09/09/2017  3:30 PM [name], NP [facility] None  12/13/2017  9:30 AM Unk Pinto, MD [facility] None  06/26/2018 10:00 AM Unk Pinto, MD [facility] None    ----------------------------------------------------------------------------------------------------------------------  HPI 57 y.o. male  presents for 3 month follow up on hypertension, continued tobacco use and cholesterol; he has also been noted to have vitamin D deficiency but declines to supplement despite recommendation.   BMI is There is no height or weight on file to calculate BMI., he {HAS HAS SWF:09323} been working on diet and exercise. Wt  Readings from Last 3 Encounters:  06/10/17 190 lb 9.6 oz (86.5 kg)  05/28/17 192 lb 3.2 oz (87.2 kg)  02/26/17 190 lb 3.2 oz (86.3 kg)   he currently continues to smoke 1.5 pack a day; discussed risks associated with smoking, patient is not ready to quit.   he has a diagnosis of GERD which is currently managed by *** protonix 40 mg daily *** ranitidine 300 mg *** he reports symptoms is currently well controlled, and denies breakthrough reflux, burning in chest, hoarseness or cough.  He has been on protonix for 1 year - was started after having symptoms only at night.   His blood pressure has been controlled at home, today their BP is    He does workout. He denies chest pain, shortness of breath, dizziness.   He is on cholesterol medication and denies myalgias. His cholesterol is at goal. The cholesterol last visit was:   Lab Results  Component Value Date   CHOL 163 05/28/2017   HDL 107 05/28/2017   LDLCALC 36 05/28/2017   TRIG 122 05/28/2017   CHOLHDL 1.5 05/28/2017    He {Has/has not:18111} been working on diet and exercise for prediabetes, and denies {Symptoms; diabetes w/o none:19199}. Last A1C in the office was:  Lab Results  Component Value Date   HGBA1C 5.4 05/28/2017   Patient is not on Vitamin D supplement and was low at last check:    Lab Results  Component Value Date   VD25OH 36 11/14/2016      Current Medications:  Current Outpatient Medications on File Prior to Visit  Medication Sig  . aspirin EC 81 MG tablet Take 1 tablet (81 mg total) by mouth daily.  Marland Kitchen atenolol (TENORMIN) 25 MG tablet TAKE 1 TABLET  BY MOUTH EVERY DAY  . atorvastatin (LIPITOR) 40 MG tablet TAKE 1 TABLET DAILY FOR CHOLESTEROL  . losartan (COZAAR) 50 MG tablet Take 1 tablet (50 mg total) by mouth daily.  . nitroGLYCERIN (NITROSTAT) 0.4 MG SL tablet Place 1 tablet (0.4 mg total) under the tongue every 5 (five) minutes as needed for chest pain.  . pantoprazole (PROTONIX) 40 MG tablet TAKE 1 TABLET  BY MOUTH EVERY DAY FOR HEARTBURN AND INDIGESTION  . ranitidine (ZANTAC) 300 MG tablet Take 1 to 2 tablets daily for heartburn & reflux   No current facility-administered medications on file prior to visit.      Allergies: No Known Allergies   Medical History:  Past Medical History:  Diagnosis Date  . Coronary artery disease    a. Cath ~ 25 yrs ago, dx w/ pleurisy;  b. 07/2015 Cath/PCI: LM nl, LAD 38m (2.75x24 Synergy DES), LCX nl, RCA nl.  Marland Kitchen GERD (gastroesophageal reflux disease)   . Hypertensive heart disease   . Tobacco abuse    Family history- Reviewed and unchanged Social history- Reviewed and unchanged   Review of Systems:  Review of Systems  Constitutional: Negative for malaise/fatigue and weight loss.  HENT: Negative for hearing loss and tinnitus.   Eyes: Negative for blurred vision and double vision.  Respiratory: Negative for cough, shortness of breath and wheezing.   Cardiovascular: Negative for chest pain, palpitations, orthopnea, claudication and leg swelling.  Gastrointestinal: Negative for abdominal pain, blood in stool, constipation, diarrhea, heartburn, melena, nausea and vomiting.  Genitourinary: Negative.   Musculoskeletal: Negative for joint pain and myalgias.  Skin: Negative for rash.  Neurological: Negative for dizziness, tingling, sensory change, weakness and headaches.  Endo/Heme/Allergies: Negative for polydipsia.  Psychiatric/Behavioral: Negative.   All other systems reviewed and are negative.     Physical Exam: There were no vitals taken for this visit. Wt Readings from Last 3 Encounters:  06/10/17 190 lb 9.6 oz (86.5 kg)  05/28/17 192 lb 3.2 oz (87.2 kg)  02/26/17 190 lb 3.2 oz (86.3 kg)   General Appearance: Well nourished, in no apparent distress. Eyes: PERRLA, EOMs, conjunctiva no swelling or erythema Sinuses: No Frontal/maxillary tenderness ENT/Mouth: Ext aud canals clear, TMs without erythema, bulging. No erythema, swelling, or exudate  on post pharynx.  Tonsils not swollen or erythematous. Hearing normal.  Neck: Supple, thyroid normal.  Respiratory: Respiratory effort normal, BS equal bilaterally without rales, rhonchi, wheezing or stridor.  Cardio: RRR with no MRGs. Brisk peripheral pulses without edema.  Abdomen: Soft, + BS.  Non tender, no guarding, rebound, hernias, masses. Lymphatics: Non tender without lymphadenopathy.  Musculoskeletal: Full ROM, 5/5 strength, Normal gait - some pain but no notable weakness with flexion of L wrist.  Skin: Warm, dry without rashes, lesions, ecchymosis.  Neuro: Cranial nerves intact. No cerebellar symptoms.  Psych: Awake and oriented X 3, normal affect, Insight and Judgment appropriate.    Izora Ribas, NP 11:19 AM [facility]

## 2017-09-09 ENCOUNTER — Ambulatory Visit: Payer: Self-pay | Admitting: Adult Health

## 2017-09-15 NOTE — Progress Notes (Deleted)
FOLLOW UP  Assessment and Plan:   CAD Control blood pressure, cholesterol, glucose, increase exercise.  Continue current medications Followed by cardiology  Hypertension Well controlled with current medications Monitor blood pressure at home; patient to call if consistently greater than 130/80 Continue DASH diet.   Reminder to go to the ER if any CP, SOB, nausea, dizziness, severe HA, changes vision/speech, left arm numbness and tingling and jaw pain.  Cholesterol Continue medication Continue low cholesterol diet and exercise.  Check lipid panel.   Tobacco use Discussed risks associated with tobacco use and advised to reduce or quit Patient is not ready to do so, but advised to consider strongly Will follow up at the next visit  GERD Well managed on current medications Discussed diet, avoiding triggers and other lifestyle changes  Vitamin D Def Recommended supplementation after discussion of risks associated with low levels Defer checking level as patient has not supplemented ***  Continue diet and meds as discussed. Further disposition pending results of labs. Discussed med's effects and SE's.   Over 30 minutes of exam, counseling, chart review, and critical decision making was performed.   Future Appointments  Date Time Provider [place]  09/16/2017  4:15 PM [name], NP [facility] None  12/13/2017  9:30 AM Unk Pinto, MD [facility] None  06/26/2018 10:00 AM Unk Pinto, MD [facility] None    ----------------------------------------------------------------------------------------------------------------------  HPI 57 y.o. male  presents for 3 month follow up on hypertension, continued tobacco use and cholesterol; he has also been noted to have vitamin D deficiency but declines to supplement despite recommendation.   BMI is There is no height or weight on file to calculate BMI., he {HAS HAS YQM:57846} been working on diet and exercise. Wt  Readings from Last 3 Encounters:  06/10/17 190 lb 9.6 oz (86.5 kg)  05/28/17 192 lb 3.2 oz (87.2 kg)  02/26/17 190 lb 3.2 oz (86.3 kg)   he currently continues to smoke 1.5 pack a day; discussed risks associated with smoking, patient is not ready to quit.   he has a diagnosis of GERD which is currently managed by *** protonix 40 mg daily *** ranitidine 300 mg *** he reports symptoms is currently well controlled, and denies breakthrough reflux, burning in chest, hoarseness or cough.  He has been on protonix for 1 year - was started after having symptoms only at night.   His blood pressure has been controlled at home, today their BP is    He does workout. He denies chest pain, shortness of breath, dizziness.   He is on cholesterol medication and denies myalgias. His cholesterol is at goal. The cholesterol last visit was:   Lab Results  Component Value Date   CHOL 163 05/28/2017   HDL 107 05/28/2017   LDLCALC 36 05/28/2017   TRIG 122 05/28/2017   CHOLHDL 1.5 05/28/2017    He {Has/has not:18111} been working on diet and exercise for prediabetes, and denies {Symptoms; diabetes w/o none:19199}. Last A1C in the office was:  Lab Results  Component Value Date   HGBA1C 5.4 05/28/2017   Patient is not on Vitamin D supplement and was low at last check:    Lab Results  Component Value Date   VD25OH 36 11/14/2016      Current Medications:  Current Outpatient Medications on File Prior to Visit  Medication Sig  . aspirin EC 81 MG tablet Take 1 tablet (81 mg total) by mouth daily.  Marland Kitchen atenolol (TENORMIN) 25 MG tablet TAKE 1 TABLET  BY MOUTH EVERY DAY  . atorvastatin (LIPITOR) 40 MG tablet TAKE 1 TABLET DAILY FOR CHOLESTEROL  . losartan (COZAAR) 50 MG tablet Take 1 tablet (50 mg total) by mouth daily.  . nitroGLYCERIN (NITROSTAT) 0.4 MG SL tablet Place 1 tablet (0.4 mg total) under the tongue every 5 (five) minutes as needed for chest pain.  . pantoprazole (PROTONIX) 40 MG tablet TAKE 1 TABLET  BY MOUTH EVERY DAY FOR HEARTBURN AND INDIGESTION  . ranitidine (ZANTAC) 300 MG tablet Take 1 to 2 tablets daily for heartburn & reflux   No current facility-administered medications on file prior to visit.      Allergies: No Known Allergies   Medical History:  Past Medical History:  Diagnosis Date  . Coronary artery disease    a. Cath ~ 25 yrs ago, dx w/ pleurisy;  b. 07/2015 Cath/PCI: LM nl, LAD 55m (2.75x24 Synergy DES), LCX nl, RCA nl.  Marland Kitchen GERD (gastroesophageal reflux disease)   . Hypertensive heart disease   . Tobacco abuse    Family history- Reviewed and unchanged Social history- Reviewed and unchanged   Review of Systems:  Review of Systems  Constitutional: Negative for malaise/fatigue and weight loss.  HENT: Negative for hearing loss and tinnitus.   Eyes: Negative for blurred vision and double vision.  Respiratory: Negative for cough, shortness of breath and wheezing.   Cardiovascular: Negative for chest pain, palpitations, orthopnea, claudication and leg swelling.  Gastrointestinal: Negative for abdominal pain, blood in stool, constipation, diarrhea, heartburn, melena, nausea and vomiting.  Genitourinary: Negative.   Musculoskeletal: Negative for joint pain and myalgias.  Skin: Negative for rash.  Neurological: Negative for dizziness, tingling, sensory change, weakness and headaches.  Endo/Heme/Allergies: Negative for polydipsia.  Psychiatric/Behavioral: Negative.   All other systems reviewed and are negative.     Physical Exam: There were no vitals taken for this visit. Wt Readings from Last 3 Encounters:  06/10/17 190 lb 9.6 oz (86.5 kg)  05/28/17 192 lb 3.2 oz (87.2 kg)  02/26/17 190 lb 3.2 oz (86.3 kg)   General Appearance: Well nourished, in no apparent distress. Eyes: PERRLA, EOMs, conjunctiva no swelling or erythema Sinuses: No Frontal/maxillary tenderness ENT/Mouth: Ext aud canals clear, TMs without erythema, bulging. No erythema, swelling, or exudate  on post pharynx.  Tonsils not swollen or erythematous. Hearing normal.  Neck: Supple, thyroid normal.  Respiratory: Respiratory effort normal, BS equal bilaterally without rales, rhonchi, wheezing or stridor.  Cardio: RRR with no MRGs. Brisk peripheral pulses without edema.  Abdomen: Soft, + BS.  Non tender, no guarding, rebound, hernias, masses. Lymphatics: Non tender without lymphadenopathy.  Musculoskeletal: Full ROM, 5/5 strength, Normal gait - some pain but no notable weakness with flexion of L wrist.  Skin: Warm, dry without rashes, lesions, ecchymosis.  Neuro: Cranial nerves intact. No cerebellar symptoms.  Psych: Awake and oriented X 3, normal affect, Insight and Judgment appropriate.    Izora Ribas, NP 9:50 AM [facility]

## 2017-09-16 ENCOUNTER — Ambulatory Visit: Payer: Self-pay | Admitting: Adult Health

## 2017-10-18 NOTE — Progress Notes (Addendum)
FOLLOW UP  Assessment and Plan:   CAD Control blood pressure, cholesterol, glucose, increase exercise.  Stop smoking Followed by cardiology  Hypertension Continue atenolol; increase losartan to 100 mg daily Monitor blood pressure at home; patient to call if consistently greater than 130/80 Continue DASH diet.   Reminder to go to the ER if any CP, SOB, nausea, dizziness, severe HA, changes vision/speech, left arm numbness and tingling and jaw pain.  Cholesterol At goal; continue medication Continue low cholesterol diet and exercise.  Check lipid panel.   Tobacco use Discussed risks associated with tobacco use and advised to reduce or quit Patient is not ready to do so, but advised to consider strongly Will follow up at the next visit Monitor by imaging annually at CPE  GERD Symptoms well managed without breakthrough Failed trial to taper off of PPI  Vitamin D Def He has increased supplement and taking 10000 IU daily Check vit D  Continue diet and meds as discussed. Further disposition pending results of labs. Discussed med's effects and SE's.   Over 30 minutes of exam, counseling, chart review, and critical decision making was performed.   Future Appointments  Date Time Provider [place]  12/13/2017  9:30 AM Unk Pinto, MD [facility] None  06/26/2018 10:00 AM Unk Pinto, MD [facility] None    ----------------------------------------------------------------------------------------------------------------------  HPI 57 y.o. male  presents for 3 month follow up on hypertension, continued tobacco use and cholesterol; He is s/p mid LAD 90% lesion stented in mid May 2017, followed by Dr. Marlou Porch.   he currently continues to smoke 1.5 pack a day; discussed risks associated with smoking, patient is not ready to quit.   he has a diagnosis of GERD which is currently managed by protonix 40 mg daily he reports symptoms is currently well controlled, and denies  breakthrough reflux, burning in chest, hoarseness or cough.  He has tried high dose ranitidine but didn't do well with this.   BMI is Body mass index is 23.76 kg/m., he has not been working on diet and exercise. Wt Readings from Last 3 Encounters:  10/21/17 192 lb 9.6 oz (87.4 kg)  06/10/17 190 lb 9.6 oz (86.5 kg)  05/28/17 192 lb 3.2 oz (87.2 kg)   His blood pressure has not been controlled at home, today their BP is BP: (!) 148/92  He does workout. He denies chest pain, shortness of breath, dizziness.   He is on cholesterol medication (atorvastatin 20 mg daily) and denies myalgias. His cholesterol is at goal. The cholesterol last visit was:   Lab Results  Component Value Date   CHOL 163 05/28/2017   HDL 107 05/28/2017   LDLCALC 36 05/28/2017   TRIG 122 05/28/2017   CHOLHDL 1.5 05/28/2017   Patient is on Vitamin D supplement, now taking 10000 IU daily  Lab Results  Component Value Date   VD25OH 36 11/14/2016      Current Medications:  Current Outpatient Medications on File Prior to Visit  Medication Sig  . aspirin EC 81 MG tablet Take 1 tablet (81 mg total) by mouth daily.  Marland Kitchen atorvastatin (LIPITOR) 40 MG tablet TAKE 1 TABLET DAILY FOR CHOLESTEROL  . losartan (COZAAR) 50 MG tablet Take 1 tablet (50 mg total) by mouth daily.  . nitroGLYCERIN (NITROSTAT) 0.4 MG SL tablet Place 1 tablet (0.4 mg total) under the tongue every 5 (five) minutes as needed for chest pain.  . pantoprazole (PROTONIX) 40 MG tablet TAKE 1 TABLET BY MOUTH EVERY DAY FOR HEARTBURN  AND INDIGESTION  . ranitidine (ZANTAC) 300 MG tablet Take 1 to 2 tablets daily for heartburn & reflux (Patient not taking: Reported on 10/21/2017)   No current facility-administered medications on file prior to visit.      Allergies: No Known Allergies   Medical History:  Past Medical History:  Diagnosis Date  . Coronary artery disease    a. Cath ~ 25 yrs ago, dx w/ pleurisy;  b. 07/2015 Cath/PCI: LM nl, LAD 63m (2.75x24  Synergy DES), LCX nl, RCA nl.  Marland Kitchen GERD (gastroesophageal reflux disease)   . Hypertensive heart disease   . Tobacco abuse    Family history- Reviewed and unchanged Social history- Reviewed and unchanged   Review of Systems:  Review of Systems  Constitutional: Negative for malaise/fatigue and weight loss.  HENT: Negative for hearing loss and tinnitus.   Eyes: Negative for blurred vision and double vision.  Respiratory: Negative for cough, shortness of breath and wheezing.   Cardiovascular: Negative for chest pain, palpitations, orthopnea, claudication and leg swelling.  Gastrointestinal: Negative for abdominal pain, blood in stool, constipation, diarrhea, heartburn, melena, nausea and vomiting.  Genitourinary: Negative.   Musculoskeletal: Negative for joint pain and myalgias.  Skin: Negative for rash.  Neurological: Negative for dizziness, tingling, sensory change, weakness and headaches.  Endo/Heme/Allergies: Negative for polydipsia.  Psychiatric/Behavioral: Negative.   All other systems reviewed and are negative.   Physical Exam: BP (!) 148/92   Pulse 64   Temp 97.7 F (36.5 C)   Ht 6' 3.5" (1.918 m)   Wt 192 lb 9.6 oz (87.4 kg)   SpO2 98%   BMI 23.76 kg/m  Wt Readings from Last 3 Encounters:  10/21/17 192 lb 9.6 oz (87.4 kg)  06/10/17 190 lb 9.6 oz (86.5 kg)  05/28/17 192 lb 3.2 oz (87.2 kg)   General Appearance: Well nourished, in no apparent distress. Eyes: PERRLA, EOMs, conjunctiva no swelling or erythema Sinuses: No Frontal/maxillary tenderness ENT/Mouth: Ext aud canals clear, TMs without erythema, bulging. No erythema, swelling, or exudate on post pharynx.  Tonsils not swollen or erythematous. Hearing normal.  Neck: Supple, thyroid normal.  Respiratory: Respiratory effort normal, BS equal bilaterally without rales, rhonchi, wheezing or stridor.  Cardio: RRR with no MRGs. Brisk peripheral pulses without edema.  Abdomen: Soft, + BS.  Non tender, no guarding,  rebound, hernias, masses. Lymphatics: Non tender without lymphadenopathy.  Musculoskeletal: Full ROM, 5/5 strength, Normal gait Skin: Warm, dry without rashes, lesions, ecchymosis.  Neuro: Cranial nerves intact. No cerebellar symptoms.  Psych: Awake and oriented X 3, normal affect, Insight and Judgment appropriate.    Izora Ribas, NP 11:29 AM [facility]

## 2017-10-21 ENCOUNTER — Ambulatory Visit: Payer: 59 | Admitting: Adult Health

## 2017-10-21 ENCOUNTER — Encounter: Payer: Self-pay | Admitting: Adult Health

## 2017-10-21 VITALS — BP 148/92 | HR 64 | Temp 97.7°F | Ht 75.5 in | Wt 192.6 lb

## 2017-10-21 DIAGNOSIS — E782 Mixed hyperlipidemia: Secondary | ICD-10-CM | POA: Diagnosis not present

## 2017-10-21 DIAGNOSIS — K219 Gastro-esophageal reflux disease without esophagitis: Secondary | ICD-10-CM

## 2017-10-21 DIAGNOSIS — I10 Essential (primary) hypertension: Secondary | ICD-10-CM | POA: Diagnosis not present

## 2017-10-21 DIAGNOSIS — Z79899 Other long term (current) drug therapy: Secondary | ICD-10-CM | POA: Diagnosis not present

## 2017-10-21 DIAGNOSIS — Z720 Tobacco use: Secondary | ICD-10-CM

## 2017-10-21 DIAGNOSIS — E559 Vitamin D deficiency, unspecified: Secondary | ICD-10-CM

## 2017-10-21 DIAGNOSIS — Z6823 Body mass index (BMI) 23.0-23.9, adult: Secondary | ICD-10-CM

## 2017-10-21 DIAGNOSIS — I2510 Atherosclerotic heart disease of native coronary artery without angina pectoris: Secondary | ICD-10-CM

## 2017-10-21 MED ORDER — ATENOLOL 25 MG PO TABS: 25.0000 mg | ORAL_TABLET | Freq: Every day | ORAL | 6 refills | Status: DC

## 2017-10-21 MED ORDER — LOSARTAN POTASSIUM 100 MG PO TABS: 100.0000 mg | ORAL_TABLET | Freq: Every day | ORAL | 1 refills | Status: DC

## 2017-10-21 NOTE — Patient Instructions (Signed)
Increase losartan to 100 mg daily - double up until you run out, then when you refill it will be at 100 mg tabs   Monitor your blood pressure at home, please keep a record and bring that in with you to your next office visit.   Go to the ER if any CP, SOB, nausea, dizziness, severe HA, changes vision/speech  Goal BP 130/80   Your most recent BP: BP: (!) 148/92   Take your medications faithfully as instructed. Maintain a healthy weight. Get at least 150 minutes of aerobic exercise per week. Minimize salt intake. Minimize alcohol intake  DASH Eating Plan DASH stands for "Dietary Approaches to Stop Hypertension." The DASH eating plan is a healthy eating plan that has been shown to reduce high blood pressure (hypertension). Additional health benefits may include reducing the risk of type 2 diabetes mellitus, heart disease, and stroke. The DASH eating plan may also help with weight loss. WHAT DO I NEED TO KNOW ABOUT THE DASH EATING PLAN? For the DASH eating plan, you will follow these general guidelines:  Choose foods with a percent daily value for sodium of less than 5% (as listed on the food label).  Use salt-free seasonings or herbs instead of table salt or sea salt.  Check with your health care provider or pharmacist before using salt substitutes.  Eat lower-sodium products, often labeled as "lower sodium" or "no salt added."  Eat fresh foods.  Eat more vegetables, fruits, and low-fat dairy products.  Choose whole grains. Look for the word "whole" as the first word in the ingredient list.  Choose fish and skinless chicken or [place] more often than red meat. Limit fish, poultry, and meat to 6 oz (170 g) each day.  Limit sweets, desserts, sugars, and sugary drinks.  Choose heart-healthy fats.  Limit cheese to 1 oz (28 g) per day.  Eat more home-cooked food and less restaurant, buffet, and fast food.  Limit fried foods.  Cook foods using methods other than  frying.  Limit canned vegetables. If you do use them, rinse them well to decrease the sodium.  When eating at a restaurant, ask that your food be prepared with less salt, or no salt if possible. WHAT FOODS CAN I EAT? Seek help from a dietitian for individual calorie needs. Grains Whole grain or whole wheat bread. Brown rice. Whole grain or whole wheat pasta. Quinoa, bulgur, and whole grain cereals. Low-sodium cereals. Corn or whole wheat flour tortillas. Whole grain cornbread. Whole grain crackers. Low-sodium crackers. Vegetables Fresh or frozen vegetables (raw, steamed, roasted, or grilled). Low-sodium or reduced-sodium tomato and vegetable juices. Low-sodium or reduced-sodium tomato sauce and paste. Low-sodium or reduced-sodium canned vegetables.  Fruits All fresh, canned (in natural juice), or frozen fruits. Meat and Other Protein Products Ground beef (85% or leaner), grass-fed beef, or beef trimmed of fat. Skinless chicken or [place]. Ground chicken or [place]. Pork trimmed of fat. All fish and seafood. Eggs. Dried beans, peas, or lentils. Unsalted nuts and seeds. Unsalted canned beans. Dairy Low-fat dairy products, such as skim or 1% milk, 2% or reduced-fat cheeses, low-fat ricotta or cottage cheese, or plain low-fat yogurt. Low-sodium or reduced-sodium cheeses. Fats and Oils Tub margarines without trans fats. Light or reduced-fat mayonnaise and salad dressings (reduced sodium). Avocado. Safflower, olive, or canola oils. Natural peanut or almond butter. Other Unsalted popcorn and pretzels. The items listed above may not be a complete list of recommended foods or beverages. Contact your dietitian for more options. WHAT  FOODS ARE NOT RECOMMENDED? Grains White bread. White pasta. White rice. Refined cornbread. Bagels and croissants. Crackers that contain trans fat. Vegetables Creamed or fried vegetables. Vegetables in a cheese sauce. Regular canned vegetables. Regular canned tomato sauce  and paste. Regular tomato and vegetable juices. Fruits Dried fruits. Canned fruit in light or heavy syrup. Fruit juice. Meat and Other Protein Products Fatty cuts of meat. Ribs, chicken wings, bacon, sausage, bologna, salami, chitterlings, fatback, hot dogs, bratwurst, and packaged luncheon meats. Salted nuts and seeds. Canned beans with salt. Dairy Whole or 2% milk, cream, half-and-half, and cream cheese. Whole-fat or sweetened yogurt. Full-fat cheeses or blue cheese. Nondairy creamers and whipped toppings. Processed cheese, cheese spreads, or cheese curds. Condiments Onion and garlic salt, seasoned salt, table salt, and sea salt. Canned and packaged gravies. Worcestershire sauce. Tartar sauce. Barbecue sauce. Teriyaki sauce. Soy sauce, including reduced sodium. Steak sauce. Fish sauce. Oyster sauce. Cocktail sauce. Horseradish. Ketchup and mustard. Meat flavorings and tenderizers. Bouillon cubes. Hot sauce. Tabasco sauce. Marinades. Taco seasonings. Relishes. Fats and Oils Butter, stick margarine, lard, shortening, ghee, and bacon fat. Coconut, palm kernel, or palm oils. Regular salad dressings. Other Pickles and olives. Salted popcorn and pretzels. The items listed above may not be a complete list of foods and beverages to avoid. Contact your dietitian for more information. WHERE CAN I FIND MORE INFORMATION? National Heart, [employer], and [employer] Institute: travelstabloid.com Document Released: 03/01/2011 Document Revised: 07/27/2013 Document Reviewed: 01/14/2013 [facility] Patient Information 2015 [place], [place]. This information is not intended to replace advice given to you by your health care provider. Make sure you discuss any questions you have with your health care provider.

## 2017-10-22 LAB — CBC WITH DIFFERENTIAL/PLATELET
BASOS ABS: 47 {cells}/uL (ref 0–200)
Basophils Relative: 0.7 %
EOS ABS: 168 {cells}/uL (ref 15–500)
Eosinophils Relative: 2.5 %
HEMATOCRIT: 43.5 % (ref 38.5–50.0)
Hemoglobin: 14.9 g/dL (ref 13.2–17.1)
LYMPHS ABS: 1869 {cells}/uL (ref 850–3900)
MCH: 31.7 pg (ref 27.0–33.0)
MCHC: 34.3 g/dL (ref 32.0–36.0)
MCV: 92.6 fL (ref 80.0–100.0)
MPV: 11 fL (ref 7.5–12.5)
Monocytes Relative: 7.4 %
NEUTROS PCT: 61.5 %
Neutro Abs: 4121 cells/uL (ref 1500–7800)
Platelets: 197 10*3/uL (ref 140–400)
RBC: 4.7 10*6/uL (ref 4.20–5.80)
RDW: 12.9 % (ref 11.0–15.0)
Total Lymphocyte: 27.9 %
WBC: 6.7 10*3/uL (ref 3.8–10.8)
WBCMIX: 496 {cells}/uL (ref 200–950)

## 2017-10-22 LAB — LIPID PANEL
CHOL/HDL RATIO: 1.8 (calc) (ref <5.0)
CHOLESTEROL: 164 mg/dL (ref <200)
HDL: 93 mg/dL (ref >40)
LDL Cholesterol (Calc): 54 mg/dL (calc)
Non-HDL Cholesterol (Calc): 71 mg/dL (calc) (ref <130)
Triglycerides: 90 mg/dL (ref <150)

## 2017-10-22 LAB — COMPLETE METABOLIC PANEL WITH GFR
AG Ratio: 2 (calc) (ref 1.0–2.5)
ALBUMIN MSPROF: 4.9 g/dL (ref 3.6–5.1)
ALKALINE PHOSPHATASE (APISO): 72 U/L (ref 40–115)
ALT: 31 U/L (ref 9–46)
AST: 23 U/L (ref 10–35)
BILIRUBIN TOTAL: 0.6 mg/dL (ref 0.2–1.2)
BUN: 14 mg/dL (ref 7–25)
CO2: 31 mmol/L (ref 20–32)
CREATININE: 1.07 mg/dL (ref 0.70–1.33)
Calcium: 10 mg/dL (ref 8.6–10.3)
Chloride: 104 mmol/L (ref 98–110)
GFR, Est African American: 89 mL/min/{1.73_m2} (ref >=60)
GFR, Est Non African American: 77 mL/min/{1.73_m2} (ref >=60)
GLOBULIN: 2.4 g/dL (ref 1.9–3.7)
Glucose, Bld: 94 mg/dL (ref 65–99)
Potassium: 4.3 mmol/L (ref 3.5–5.3)
SODIUM: 140 mmol/L (ref 135–146)
Total Protein: 7.3 g/dL (ref 6.1–8.1)

## 2017-10-22 LAB — TSH: TSH: 0.72 mIU/L (ref 0.40–4.50)

## 2017-10-22 LAB — MAGNESIUM: MAGNESIUM: 2.1 mg/dL (ref 1.5–2.5)

## 2017-12-13 ENCOUNTER — Ambulatory Visit: Payer: Self-pay | Admitting: Internal Medicine

## 2017-12-15 ENCOUNTER — Other Ambulatory Visit: Payer: Self-pay | Admitting: Physician Assistant

## 2018-01-08 NOTE — Progress Notes (Signed)
   Patient ID: Corey Collier, male   DOB: Dec 03, 1960, 57 y.o.   MRN: [id number]  HPI     This very nice 57 yo DWM w/HTN, CAD, HLD, PreDM presents with  A 5 day recent onset of post-prandial mid-abdominal bloating discomfort and cramping in thr LLQ. No fever , chills, diarrhea or blood/mucus in BM's.   Medication Sig  . aspirin EC 81 MG tablet Take 1 tablet  daily.  Marland Kitchen atenolol  25 MG tablet Take 1 tablet  daily.  Marland Kitchen atorvastatin  40 MG tablet TAKE 1 TABLET DAILY  . Cholecalciferol 5000 units  Take 10,000 Units  daily.  Marland Kitchen losartan  50 MG tablet TAKE 1 TABLET  EVERY DAY  . NITROSTAT 0.4 MG SL tablet as needed for chest pain.  . pantoprazole (PROTONIX) 40 MG tablet TAKE 1 TABLET  EVERY DAY    No Known Allergies  Past Medical History:  Diagnosis Date  . Coronary artery disease    a. Cath ~ 25 yrs ago, dx w/ pleurisy;  b. 07/2015 Cath/PCI: LM nl, LAD 45m (2.75x24 Synergy DES), LCX nl, RCA nl.  Marland Kitchen GERD (gastroesophageal reflux disease)   . Hypertensive heart disease   . Tobacco abuse    Past Surgical History:  Procedure Laterality Date  . BACK SURGERY    . CARDIAC CATHETERIZATION N/A 08/11/2015   Procedure: Left Heart Cath and Coronary Angiography;  Surgeon: Jerline Pain, MD;  Location: [place] [facility];  Service: Cardiovascular;  Laterality: N/A;  . CARDIAC CATHETERIZATION N/A 08/11/2015   Procedure: Coronary Stent Intervention;  Surgeon: Jerline Pain, MD;  Location: [place] [facility];  Service: Cardiovascular;  Laterality: N/A;  . CARDIAC CATHETERIZATION N/A 08/11/2015   Procedure: Coronary Stent Intervention;  Surgeon: Jettie Booze, MD;  Location: [place] [facility];  Service: Cardiovascular;  Laterality: N/A;  . LUMBAR DISC SURGERY     Review of Systems    10 point systems review negative except as above.    Objective:   Physical Exam  BP 126/84   Pulse 60   Temp 97.6 F (36.4 C)   Resp 18   Ht 6' 3.5" (1.918 m)   Wt 189 lb 9.6 oz (86 kg)   BMI 23.39 kg/m   HEENT  - WNL. Neck - supple.  Chest - Clear equal BS. Cor - Nl HS. RRR w/o sig MGR. PP 1(+). No edema. Abd - Soft. Flat w/o masses or tenderness. No guarding or rebound. BS normal.  MS- FROM w/o deformities.  Gait Nl. Neuro -  Nl w/o focal abnormalities.    Assessment:   1. Irritable bowel syndrome, unspecified type  Plan:     - dicyclomine (BENTYL) 20 MG tablet; Take 1 tablet 3 to 4 x/ day - before meals & Bedtime for abdominal Cramping  Dispense: 360 tablet; Refill: 1  - Discussed high fiber diet.

## 2018-01-09 ENCOUNTER — Ambulatory Visit: Payer: 59 | Admitting: Internal Medicine

## 2018-01-09 VITALS — BP 126/84 | HR 60 | Temp 97.6°F | Resp 18 | Ht 75.5 in | Wt 189.6 lb

## 2018-01-09 DIAGNOSIS — K589 Irritable bowel syndrome without diarrhea: Secondary | ICD-10-CM | POA: Diagnosis not present

## 2018-01-09 MED ORDER — DICYCLOMINE HCL 20 MG PO TABS: ORAL_TABLET | ORAL | 1 refills | Status: DC

## 2018-01-09 NOTE — Patient Instructions (Signed)
Irritable Bowel Syndrome  Irritable bowel syndrome (IBS) is not one specific disease. It is a group of symptoms that affects the organs responsible for digestion (gastrointestinal or GI tract). To regulate how your GI tract works, your body sends signals back and forth between your intestines and your brain. If you have IBS, there may be a problem with these signals. As a result, your GI tract does not function normally. Your intestines may become more sensitive and overreact to certain things. This is especially true when you eat certain foods or when you are under stress. There are four types of IBS. These may be determined based on the consistency of your stool:  IBS with diarrhea.  IBS with constipation.  Mixed IBS.  Unsubtyped IBS.  It is important to know which type of IBS you have. Some treatments are more likely to be helpful for certain types of IBS.  What are the causes?  Stress  What increases the risk?  You may have a higher risk of IBS if:   You are younger than 56 years old.  You have a family history of IBS.  You have mental health problems.  You have had bacterial infection of your GI tract.  What are the signs or symptoms?  Symptoms of IBS vary from person to person. The main symptom is abdominal pain or discomfort. Additional symptoms usually include one or more of the following:  Diarrhea, constipation, or both.  Abdominal swelling or bloating.  Feeling full or sick after eating a small or regular-size meal.  Frequent gas.  Mucus in the stool.  A feeling of having more stool left after a bowel movement.  Symptoms tend to come and go. They may be associated with stress, psychiatric conditions, or nothing at all.   How is this diagnosed?  There is no specific test to diagnose IBS. Your health care provider will make a diagnosis based on a physical exam, medical history, and your symptoms. You may have other tests to rule out other conditions  that may be causing your symptoms. These may include:  Blood tests.  X-rays.  CT scan.  Endoscopy and colonoscopy. This is a test in which your GI tract is viewed with a long, thin, flexible tube.  How is this treated? There is no cure for IBS, but treatment can help relieve symptoms. IBS treatment often includes:  Changes to your diet, such as: ? Eating more fiber. ? Avoiding foods that cause symptoms. ? Drinking more water. ? Eating regular, medium-sized portioned meals.  Medicines. These may include: ? Fiber supplements if you have constipation. ? Medicine to control diarrhea (antidiarrheal medicines). ? Medicine to help control muscle spasms in your GI tract (antispasmodic medicines). ? Medicines to help with any mental health issues, such as antidepressants or tranquilizers.  Therapy. ? Talk therapy may help with anxiety, depression, or other mental health issues that can make IBS symptoms worse.  Stress reduction. ? Managing your stress can help keep symptoms under control.  Follow these instructions at home:  Take medicines only as directed by your health care provider.  Eat a healthy diet. ? Avoid foods and drinks with added sugar. ? Include more whole grains, fruits, and vegetables gradually into your diet. This may be especially helpful if you have IBS with constipation. ? Avoid any foods and drinks that make your symptoms worse. These may include dairy products and caffeinated or carbonated drinks. ? Do not eat large meals. ? Drink enough fluid to keep  your urine clear or pale yellow.  Exercise regularly. Ask your health care provider for recommendations of good activities for you.  Keep all follow-up visits as directed by your health care provider. This is important. Contact a health care provider if:  You have constant pain.  You have trouble or pain with swallowing.  You have worsening diarrhea. Get help right away if:  You have severe and worsening  abdominal pain.  You have diarrhea and: ? You have a rash, stiff neck, or severe headache. ? You are irritable, sleepy, or difficult to awaken. ? You are weak, dizzy, or extremely thirsty.  You have bright red blood in your stool or you have black tarry stools.  You have unusual abdominal swelling that is painful.  You vomit continuously.  You vomit blood (hematemesis).  You have both abdominal pain and a fever.   +++++++++++++++++++++++++++++++ Diet for Irritable Bowel Syndrome  When you have irritable bowel syndrome (IBS), the foods you eat and your eating habits are very important. IBS may cause various symptoms, such as abdominal pain, constipation, or diarrhea. Choosing the right foods can help ease discomfort caused by these symptoms. Work with your health care provider and dietitian to find the best eating plan to help control your symptoms. What general guidelines do I need to follow?  Keep a food diary. This will help you identify foods that cause symptoms. Write down: ? What you eat and when. ? What symptoms you have. ? When symptoms occur in relation to your meals.  Avoid foods that cause symptoms. Talk with your dietitian about other ways to get the same nutrients that are in these foods.  Eat more foods that contain fiber. Take a fiber supplement if directed by your dietitian.  Eat your meals slowly, in a relaxed setting.  Aim to eat 5-6 small meals per day. Do not skip meals.  Drink enough fluids to keep your urine clear or pale yellow.  Ask your health care provider if you should take an over-the-counter probiotic during flare-ups to help restore healthy gut bacteria.  If you have cramping or diarrhea, try making your meals low in fat and high in carbohydrates. Examples of carbohydrates are pasta, rice, whole grain breads and cereals, fruits, and vegetables.  If dairy products cause your symptoms to flare up, try eating less of them. You might be able to  handle yogurt better than other dairy products because it contains bacteria that help with digestion. What foods are not recommended? The following are some foods and drinks that may worsen your symptoms:  Fatty foods, such as [place] fries.  Milk products, such as cheese or ice cream.  Chocolate.  Alcohol.  Products with caffeine, such as coffee.  Carbonated drinks, such as soda.  The items listed above may not be a complete list of foods and beverages to avoid. Contact your dietitian for more information. What foods are good sources of fiber? Your health care provider or dietitian may recommend that you eat more foods that contain fiber. Fiber can help reduce constipation and other IBS symptoms. Add foods with fiber to your diet a little at a time so that your body can get used to them. Too much fiber at once might cause gas and swelling of your abdomen. The following are some foods that are good sources of fiber:  Apples.  Peaches.  Pears.  Berries.  Figs.  Broccoli (raw).  Cabbage.  Carrots.  Raw peas.  Kidney beans.  Lima beans.  Whole grain bread.  Whole grain cereal.

## 2018-01-12 ENCOUNTER — Encounter: Payer: Self-pay | Admitting: Internal Medicine

## 2018-01-22 NOTE — Patient Instructions (Signed)

## 2018-01-22 NOTE — Progress Notes (Signed)
This very nice 57 y.o. DWM  presents for 6 month follow up with HTN, ASCAD, HLD, Pre-Diabetes and Vitamin D Deficiency.      Patient is treated for HTN (2017)  & BP has been controlled at home. Today's BP is at goal - 136/76.  Patient had PCA/DES for ACS in May 2017 by Dr Marlou Porch. Patient has had no complaints of any cardiac type chest pain, palpitations, dyspnea / orthopnea / PND, dizziness, claudication, or dependent edema.     Hyperlipidemia is controlled with diet & Lipitor. Patient denies myalgias or other med SE's. Last Lipids were at goal: Lab Results  Component Value Date   CHOL 164 10/21/2017   HDL 93 10/21/2017   LDLCALC 54 10/21/2017   TRIG 90 10/21/2017   CHOLHDL 1.8 10/21/2017      Also, the patient is followed expectantly for PreDiabetes and has had no symptoms of reactive hypoglycemia, diabetic polys, paresthesias or visual blurring.  Last A1c was Normal & at goal:  Lab Results  Component Value Date   HGBA1C 5.4 05/28/2017      Further, the patient also has history of Vitamin D Deficiency ("36"/ Aug 2018) and supplements vitamin D without any suspected side-effects.    Current Outpatient Medications on File Prior to Visit  Medication Sig  . aspirin EC 81 MG tablet Take 1 tablet (81 mg total) by mouth daily.  Marland Kitchen atenolol (TENORMIN) 25 MG tablet Take 1 tablet (25 mg total) by mouth daily.  Marland Kitchen atorvastatin (LIPITOR) 40 MG tablet TAKE 1 TABLET DAILY FOR CHOLESTEROL  . Cholecalciferol 5000 units CHEW Chew 10,000 Units by mouth daily.  Marland Kitchen dicyclomine (BENTYL) 20 MG tablet Take 1 tablet 3 to 4 x/ day - before meals & Bedtime for abdominal Cramping  . losartan (COZAAR) 50 MG tablet TAKE 1 TABLET BY MOUTH EVERY DAY  . nitroGLYCERIN (NITROSTAT) 0.4 MG SL tablet Place 1 tablet (0.4 mg total) under the tongue every 5 (five) minutes as needed for chest pain.  . pantoprazole (PROTONIX) 40 MG tablet TAKE 1 TABLET BY MOUTH EVERY DAY FOR HEARTBURN AND INDIGESTION   No current  facility-administered medications on file prior to visit.    No Known Allergies   PMHx:   Past Medical History:  Diagnosis Date  . Coronary artery disease    a. Cath ~ 25 yrs ago, dx w/ pleurisy;  b. 07/2015 Cath/PCI: LM nl, LAD 79m (2.75x24 Synergy DES), LCX nl, RCA nl.  Marland Kitchen GERD (gastroesophageal reflux disease)   . Hypertensive heart disease   . Tobacco abuse    Immunization History  Administered Date(s) Administered  . PPD Test 05/28/2017  . Tdap 01/25/2016   Past Surgical History:  Procedure Laterality Date  . BACK SURGERY    . CARDIAC CATHETERIZATION N/A 08/11/2015   Procedure: Left Heart Cath and Coronary Angiography;  Surgeon: Jerline Pain, MD;  Location: [facility];  Service: Cardiovascular;  Laterality: N/A;  . CARDIAC CATHETERIZATION N/A 08/11/2015   Procedure: Coronary Stent Intervention;  Surgeon: Jerline Pain, MD;  Location: [facility];  Service: Cardiovascular;  Laterality: N/A;  . CARDIAC CATHETERIZATION N/A 08/11/2015   Procedure: Coronary Stent Intervention;  Surgeon: Jettie Booze, MD;  Location: [place];  Service: Cardiovascular;  Laterality: N/A;  . LUMBAR DISC SURGERY     FHx:    Reviewed / unchanged  SHx:    Reviewed / unchanged   Systems Review:  Constitutional: Denies  fever, chills, wt changes, headaches, insomnia, fatigue, night sweats, change in appetite. Eyes: Denies redness, blurred vision, diplopia, discharge, itchy, watery eyes.  ENT: Denies discharge, congestion, post nasal drip, epistaxis, sore throat, earache, hearing loss, dental pain, tinnitus, vertigo, sinus pain, snoring.  CV: Denies chest pain, palpitations, irregular heartbeat, syncope, dyspnea, diaphoresis, orthopnea, PND, claudication or edema. Respiratory: denies cough, dyspnea, DOE, pleurisy, hoarseness, laryngitis, wheezing.  Gastrointestinal: Denies dysphagia, odynophagia, heartburn, reflux, water brash, abdominal pain or cramps, nausea, vomiting,  bloating, diarrhea, constipation, hematemesis, melena, hematochezia  or hemorrhoids. Genitourinary: Denies dysuria, frequency, urgency, nocturia, hesitancy, discharge, hematuria or flank pain. Musculoskeletal: Denies arthralgias, myalgias, stiffness, jt. swelling, pain, limping or strain/sprain.  Skin: Denies pruritus, rash, hives, warts, acne, eczema or change in skin lesion(s). Neuro: No weakness, tremor, incoordination, spasms, paresthesia or pain. Psychiatric: Denies confusion, memory loss or sensory loss. Endo: Denies change in weight, skin or hair change.  Heme/Lymph: No excessive bleeding, bruising or enlarged lymph nodes.  Physical Exam  BP 136/76   Pulse 72   Temp (!) 97.3 F (36.3 C)   Resp 16   Ht 6' 3.5" (1.918 m)   Wt 193 lb (87.5 kg)   BMI 23.80 kg/m   Appears  well nourished, well groomed  and in no distress.  Eyes: PERRLA, EOMs, conjunctiva no swelling or erythema. Sinuses: No frontal/maxillary tenderness ENT/Mouth: EAC's clear, TM's nl w/o erythema, bulging. Nares clear w/o erythema, swelling, exudates. Oropharynx clear without erythema or exudates. Oral hygiene is good. Tongue normal, non obstructing. Hearing intact.  Neck: Supple. Thyroid not palpable. Car 2+/2+ without bruits, nodes or JVD. Chest: Respirations nl with BS clear & equal w/o rales, rhonchi, wheezing or stridor.  Cor: Heart sounds normal w/ regular rate and rhythm without sig. murmurs, gallops, clicks or rubs. Peripheral pulses normal and equal  without edema.  Abdomen: Soft & bowel sounds normal. Non-tender w/o guarding, rebound, hernias, masses or organomegaly.  Lymphatics: Unremarkable.  Musculoskeletal: Full ROM all peripheral extremities, joint stability, 5/5 strength and normal gait.  Skin: Warm, dry without exposed rashes, lesions or ecchymosis apparent.  Neuro: Cranial nerves intact, reflexes equal bilaterally. Sensory-motor testing grossly intact. Tendon reflexes grossly intact.  Pysch:  Alert & oriented x 3.  Insight and judgement nl & appropriate. No ideations.  Assessment and Plan:  1. Essential hypertension  - Continue medication, monitor blood pressure at home.  - Continue DASH diet.  Reminder to go to the ER if any CP,  SOB, nausea, dizziness, severe HA, changes vision/speech.  - CBC with Differential/Platelet - COMPLETE METABOLIC PANEL WITH GFR - Magnesium - TSH  2. Hyperlipidemia, mixed  - Continue diet/meds, exercise,& lifestyle modifications.  - Continue monitor periodic cholesterol/liver & renal functions   - Lipid panel - TSH  3. Abnormal glucose  - Continue diet, exercise, - lifestyle modifications.  - Monitor appropriate labs.  - Hemoglobin A1c - Insulin, random  4. Vitamin D deficiency  - Continue supplementation.  - VITAMIN D 25 Hydroxyl  5. Coronary artery disease involving native coronary artery of native heart without angina pectoris  - Lipid panel  6. Gastroesophageal reflux disease  - CBC with Differential/Platelet  7. Medication management   - CBC with Differential/Platelet - COMPLETE METABOLIC PANEL WITH GFR - Magnesium - Lipid panel - TSH - Hemoglobin A1c - Insulin, random - VITAMIN D 25 Hydroxyl       Discussed  regular exercise, BP monitoring, weight control to achieve/maintain BMI less than 25 and discussed med and SE's.  Recommended labs to assess and monitor clinical status with further disposition pending results of labs. Over 30 minutes of exam, counseling, chart review was performed.

## 2018-01-23 ENCOUNTER — Encounter: Payer: Self-pay | Admitting: Internal Medicine

## 2018-01-23 ENCOUNTER — Ambulatory Visit: Payer: 59 | Admitting: Internal Medicine

## 2018-01-23 VITALS — BP 136/76 | HR 72 | Temp 97.3°F | Resp 16 | Ht 75.5 in | Wt 193.0 lb

## 2018-01-23 DIAGNOSIS — I10 Essential (primary) hypertension: Secondary | ICD-10-CM | POA: Diagnosis not present

## 2018-01-23 DIAGNOSIS — I2510 Atherosclerotic heart disease of native coronary artery without angina pectoris: Secondary | ICD-10-CM | POA: Diagnosis not present

## 2018-01-23 DIAGNOSIS — Z79899 Other long term (current) drug therapy: Secondary | ICD-10-CM

## 2018-01-23 DIAGNOSIS — R7309 Other abnormal glucose: Secondary | ICD-10-CM | POA: Diagnosis not present

## 2018-01-23 DIAGNOSIS — E559 Vitamin D deficiency, unspecified: Secondary | ICD-10-CM

## 2018-01-23 DIAGNOSIS — E782 Mixed hyperlipidemia: Secondary | ICD-10-CM

## 2018-01-23 DIAGNOSIS — K219 Gastro-esophageal reflux disease without esophagitis: Secondary | ICD-10-CM

## 2018-01-24 LAB — CBC WITH DIFFERENTIAL/PLATELET
BASOS ABS: 58 {cells}/uL (ref 0–200)
Basophils Relative: 0.9 %
Eosinophils Absolute: 160 cells/uL (ref 15–500)
Eosinophils Relative: 2.5 %
HEMATOCRIT: 44.9 % (ref 38.5–50.0)
HEMOGLOBIN: 15.3 g/dL (ref 13.2–17.1)
LYMPHS ABS: 1677 {cells}/uL (ref 850–3900)
MCH: 32.1 pg (ref 27.0–33.0)
MCHC: 34.1 g/dL (ref 32.0–36.0)
MCV: 94.3 fL (ref 80.0–100.0)
MPV: 11.2 fL (ref 7.5–12.5)
Monocytes Relative: 6.1 %
NEUTROS ABS: 4115 {cells}/uL (ref 1500–7800)
Neutrophils Relative %: 64.3 %
Platelets: 197 10*3/uL (ref 140–400)
RBC: 4.76 10*6/uL (ref 4.20–5.80)
RDW: 12.9 % (ref 11.0–15.0)
Total Lymphocyte: 26.2 %
WBC: 6.4 10*3/uL (ref 3.8–10.8)
WBCMIX: 390 {cells}/uL (ref 200–950)

## 2018-01-24 LAB — COMPLETE METABOLIC PANEL WITH GFR
AG RATIO: 1.6 (calc) (ref 1.0–2.5)
ALBUMIN MSPROF: 4.6 g/dL (ref 3.6–5.1)
ALT: 37 U/L (ref 9–46)
AST: 32 U/L (ref 10–35)
Alkaline phosphatase (APISO): 82 U/L (ref 40–115)
BUN: 14 mg/dL (ref 7–25)
CALCIUM: 9.8 mg/dL (ref 8.6–10.3)
CO2: 31 mmol/L (ref 20–32)
CREATININE: 1.05 mg/dL (ref 0.70–1.33)
Chloride: 103 mmol/L (ref 98–110)
GFR, EST AFRICAN AMERICAN: 92 mL/min/{1.73_m2} (ref >=60)
GFR, EST NON AFRICAN AMERICAN: 79 mL/min/{1.73_m2} (ref >=60)
GLOBULIN: 2.9 g/dL (ref 1.9–3.7)
Glucose, Bld: 103 mg/dL — ABNORMAL HIGH (ref 65–99)
POTASSIUM: 4.1 mmol/L (ref 3.5–5.3)
SODIUM: 141 mmol/L (ref 135–146)
Total Bilirubin: 0.6 mg/dL (ref 0.2–1.2)
Total Protein: 7.5 g/dL (ref 6.1–8.1)

## 2018-01-24 LAB — LIPID PANEL
CHOLESTEROL: 150 mg/dL (ref <200)
HDL: 83 mg/dL (ref >40)
LDL CHOLESTEROL (CALC): 49 mg/dL
NON-HDL CHOLESTEROL (CALC): 67 mg/dL (ref <130)
Total CHOL/HDL Ratio: 1.8 (calc) (ref <5.0)
Triglycerides: 94 mg/dL (ref <150)

## 2018-01-24 LAB — HEMOGLOBIN A1C
Hgb A1c MFr Bld: 5.3 % of total Hgb (ref <5.7)
Mean Plasma Glucose: 105 (calc)
eAG (mmol/L): 5.8 (calc)

## 2018-01-24 LAB — MAGNESIUM: Magnesium: 2.1 mg/dL (ref 1.5–2.5)

## 2018-01-24 LAB — VITAMIN D 25 HYDROXY (VIT D DEFICIENCY, FRACTURES): VIT D 25 HYDROXY: 55 ng/mL (ref 30–100)

## 2018-01-24 LAB — INSULIN, RANDOM: Insulin: 12.6 u[IU]/mL (ref 2.0–19.6)

## 2018-01-24 LAB — TSH: TSH: 0.64 m[IU]/L (ref 0.40–4.50)

## 2018-02-15 ENCOUNTER — Other Ambulatory Visit: Payer: Self-pay | Admitting: Internal Medicine

## 2018-02-15 DIAGNOSIS — K219 Gastro-esophageal reflux disease without esophagitis: Secondary | ICD-10-CM

## 2018-03-04 ENCOUNTER — Other Ambulatory Visit: Payer: Self-pay | Admitting: Internal Medicine

## 2018-04-01 ENCOUNTER — Other Ambulatory Visit: Payer: Self-pay | Admitting: Physician Assistant

## 2018-04-01 DIAGNOSIS — E782 Mixed hyperlipidemia: Secondary | ICD-10-CM

## 2018-04-21 ENCOUNTER — Other Ambulatory Visit: Payer: Self-pay | Admitting: Adult Health

## 2018-04-28 DIAGNOSIS — Z87891 Personal history of nicotine dependence: Secondary | ICD-10-CM | POA: Insufficient documentation

## 2018-04-28 NOTE — Progress Notes (Signed)
FOLLOW UP  Assessment and Plan:   CAD Control blood pressure, cholesterol, glucose, increase exercise.  Stop smoking Followed by cardiology  Hypertension Continue medications; at goal  Monitor blood pressure at home; patient to call if consistently greater than 130/80 Continue DASH diet.   Reminder to go to the ER if any CP, SOB, nausea, dizziness, severe HA, changes vision/speech, left arm numbness and tingling and jaw pain.  Cholesterol At goal; continue medication Continue low cholesterol diet and exercise.  Check lipid panel.   Tobacco use Discussed risks associated with tobacco use and advised to reduce or quit Patient is not ready to do so, but advised to consider strongly Will follow up at the next visit Monitor by imaging annually at CPE, Low dose CT discussed today, info provided, patient is interested and will consider  GERD Symptoms well managed without breakthrough Failed trial to taper off of PPI Cut back on ETOH  Vitamin D Def He has increased supplement and taking 10000 IU daily; near goal at last check Defer vit D  Continue diet and meds as discussed. Further disposition pending results of labs. Discussed med's effects and SE's.   Over 30 minutes of exam, counseling, chart review, and critical decision making was performed.   Future Appointments  Date Time Provider [place]  08/05/2018 11:00 AM Unk Pinto, MD [facility] None    ----------------------------------------------------------------------------------------------------------------------  HPI 58 y.o. male  presents for 3 month follow up on hypertension, continued tobacco use and cholesterol; He is s/p mid LAD 90% lesion stented in mid May 2017, followed by Dr. Marlou Porch.   he currently continues to smoke 1.5 pack a day, for 41 years ; discussed risks associated with smoking, patient is not ready to quit. Low dose CT scan discussed, information provided to review.  He admits to  drinking 5-8 drinks "shots of hard liquor" - has cut back, not ready to decrease further at this time. Risks discussed and encouraged to consider strongly.   he has a diagnosis of GERD which is currently managed by protonix 40 mg daily he reports symptoms is currently well controlled, and denies breakthrough reflux, burning in chest, hoarseness or cough.  He has tried high dose ranitidine but didn't do well with this.   BMI is Body mass index is 24.2 kg/m., he has not been working on diet and exercise. Wt Readings from Last 3 Encounters:  04/30/18 196 lb 3.2 oz (89 kg)  01/23/18 193 lb (87.5 kg)  01/09/18 189 lb 9.6 oz (86 kg)   His blood pressure has not been controlled at home, today their BP is BP: 130/80  He does workout. He denies chest pain, shortness of breath, dizziness.   He is on cholesterol medication (atorvastatin 20 mg daily) and denies myalgias. His cholesterol is at goal. The cholesterol last visit was:   Lab Results  Component Value Date   CHOL 150 01/23/2018   HDL 83 01/23/2018   LDLCALC 49 01/23/2018   TRIG 94 01/23/2018   CHOLHDL 1.8 01/23/2018   Patient is on Vitamin D supplement, now taking 10000 IU daily  Lab Results  Component Value Date   VD25OH 55 01/23/2018      Current Medications:  Current Outpatient Medications on File Prior to Visit  Medication Sig  . aspirin EC 81 MG tablet Take 1 tablet (81 mg total) by mouth daily.  Marland Kitchen atenolol (TENORMIN) 25 MG tablet TAKE 1 TABLET BY MOUTH EVERY DAY  . atorvastatin (LIPITOR) 40 MG tablet TAKE  1 TABLET BY MOUTH DAILY FOR CHOLESTEROL  . Cholecalciferol 5000 units CHEW Chew 10,000 Units by mouth daily.  Marland Kitchen dicyclomine (BENTYL) 20 MG tablet Take 1 tablet 3 to 4 x/ day - before meals & Bedtime for abdominal Cramping  . losartan (COZAAR) 50 MG tablet TAKE 1 TABLET BY MOUTH EVERY DAY  . nitroGLYCERIN (NITROSTAT) 0.4 MG SL tablet Place 1 tablet (0.4 mg total) under the tongue every 5 (five) minutes as needed for chest  pain.  . pantoprazole (PROTONIX) 40 MG tablet TAKE 1 TABLET BY MOUTH EVERY DAY FOR HEARTBURN AND INDIGESTION   No current facility-administered medications on file prior to visit.      Allergies: No Known Allergies   Medical History:  Past Medical History:  Diagnosis Date  . Coronary artery disease    a. Cath ~ 25 yrs ago, dx w/ pleurisy;  b. 07/2015 Cath/PCI: LM nl, LAD 5m (2.75x24 Synergy DES), LCX nl, RCA nl.  Marland Kitchen GERD (gastroesophageal reflux disease)   . Hypertensive heart disease   . Tobacco abuse    Family history- Reviewed and unchanged Social history- Reviewed and unchanged   Review of Systems:  Review of Systems  Constitutional: Negative for malaise/fatigue and weight loss.  HENT: Negative for hearing loss and tinnitus.   Eyes: Negative for blurred vision and double vision.  Respiratory: Negative for cough, shortness of breath and wheezing.   Cardiovascular: Negative for chest pain, palpitations, orthopnea, claudication and leg swelling.  Gastrointestinal: Negative for abdominal pain, blood in stool, constipation, diarrhea, heartburn, melena, nausea and vomiting.  Genitourinary: Negative.   Musculoskeletal: Negative for joint pain and myalgias.  Skin: Negative for rash.  Neurological: Negative for dizziness, tingling, sensory change, weakness and headaches.  Endo/Heme/Allergies: Negative for polydipsia.  Psychiatric/Behavioral: Negative.   All other systems reviewed and are negative.   Physical Exam: BP 130/80   Pulse 63   Temp (!) 97.3 F (36.3 C)   Ht 6' 3.5" (1.918 m)   Wt 196 lb 3.2 oz (89 kg)   SpO2 98%   BMI 24.20 kg/m  Wt Readings from Last 3 Encounters:  04/30/18 196 lb 3.2 oz (89 kg)  01/23/18 193 lb (87.5 kg)  01/09/18 189 lb 9.6 oz (86 kg)   General Appearance: Well nourished, in no apparent distress. Eyes: PERRLA, EOMs, conjunctiva no swelling or erythema Sinuses: No Frontal/maxillary tenderness ENT/Mouth: Ext aud canals clear, TMs without  erythema, bulging. No erythema, swelling, or exudate on post pharynx.  Tonsils not swollen or erythematous. Hearing normal.  Neck: Supple, thyroid normal.  Respiratory: Respiratory effort normal, BS equal bilaterally without rales, rhonchi, wheezing or stridor.  Cardio: RRR with no MRGs. Brisk peripheral pulses without edema.  Abdomen: Soft, + BS.  Non tender, no guarding, rebound, hernias, masses. Lymphatics: Non tender without lymphadenopathy.  Musculoskeletal: Full ROM, 5/5 strength, Normal gait Skin: Warm, dry without rashes, lesions, ecchymosis.  Neuro: Cranial nerves intact. No cerebellar symptoms.  Psych: Awake and oriented X 3, normal affect, Insight and Judgment appropriate.    Izora Ribas, NP 10:42 AM [facility]

## 2018-04-30 ENCOUNTER — Ambulatory Visit: Payer: 59 | Admitting: Adult Health

## 2018-04-30 ENCOUNTER — Encounter: Payer: Self-pay | Admitting: Adult Health

## 2018-04-30 VITALS — BP 130/80 | HR 63 | Temp 97.3°F | Ht 75.5 in | Wt 196.2 lb

## 2018-04-30 DIAGNOSIS — Z6823 Body mass index (BMI) 23.0-23.9, adult: Secondary | ICD-10-CM

## 2018-04-30 DIAGNOSIS — I10 Essential (primary) hypertension: Secondary | ICD-10-CM | POA: Diagnosis not present

## 2018-04-30 DIAGNOSIS — I2510 Atherosclerotic heart disease of native coronary artery without angina pectoris: Secondary | ICD-10-CM | POA: Diagnosis not present

## 2018-04-30 DIAGNOSIS — Z720 Tobacco use: Secondary | ICD-10-CM | POA: Diagnosis not present

## 2018-04-30 DIAGNOSIS — E782 Mixed hyperlipidemia: Secondary | ICD-10-CM

## 2018-04-30 DIAGNOSIS — E559 Vitamin D deficiency, unspecified: Secondary | ICD-10-CM

## 2018-04-30 DIAGNOSIS — Z87891 Personal history of nicotine dependence: Secondary | ICD-10-CM

## 2018-04-30 DIAGNOSIS — K219 Gastro-esophageal reflux disease without esophagitis: Secondary | ICD-10-CM

## 2018-04-30 NOTE — Patient Instructions (Addendum)
Goals    . Blood Pressure < 130/80    . Reduce alcohol intake     Gradually cut back to <2 drinks per day    . Reduce smoking to <1 pack        Lung Cancer Screening A lung cancer screening is a test that checks for lung cancer. Lung cancer screening is done to look for lung cancer in its very early stages, before it spreads and becomes harder to treat and before symptoms appear. Finding cancer early improves the chances of successful treatment. It may save your life. Should I be screened for lung cancer? You should be screened for lung cancer if all of these apply:  You currently smoke or you have quit smoking within the past 15 years.  You are 62-30 years old. Screening may be recommended up to age 25 depending on your overall health and other factors.  You are in good general health.  You have a smoking history of 1 pack a day for 30 years or 2 packs a day for 15 years. Screening may also be recommended if you are at high risk for the disease. You may be at high risk if:  You have a family history of lung cancer.  You have been exposed to asbestos.  You have chronic obstructive pulmonary disease (COPD).  You have a history of previous lung cancer. How often should I be screened for lung cancer?  If you are at risk for lung cancer, it is recommended that you are screened once a year. The recommended screening test is a low-dose CT scan. How can I lower my risk of lung cancer? To lower your risk of developing lung cancer:  If you smoke, stop smoking all tobacco products.  Avoid secondhand smoke.  Avoid exposure to radiation.  Avoid exposure to radon gas. Have your home checked for radon regularly.  Avoid things that cause cancer (carcinogens).  Avoid living or working in places with high air pollution. Where to find more information Ask your health care provider about the risks and benefits of screening. More information and resources are available from these  organizations:  [employer] (ACS): www.cancer.org  American Lung Association: www.lung.org Contact a health care provider if:  You start to show symptoms of lung cancer, including: ? Coughing that will not go away. ? Wheezing. ? Chest pain. ? Coughing up blood. ? Shortness of breath. ? Weight loss that cannot be explained. ? Constant fatigue. Summary  Lung cancer screening may find lung cancer before symptoms appear. Finding cancer early improves the chances of successful treatment. It may save your life.  If you are at risk for lung cancer, it is recommended that you are screened once a year. The recommended screening test is a low-dose CT scan.  You can make lifestyle changes to lower your risk of lung cancer.  Ask your health care provider about the risks and benefits of screening. This information is not intended to replace advice given to you by your health care provider. Make sure you discuss any questions you have with your health care provider. Document Released: 02/01/2016 Document Revised: 06/04/2017 Document Reviewed: 02/01/2016 Elsevier Interactive Patient Education  2019 [employer].     Steps to Quit Smoking  Smoking tobacco can be harmful to your health and can affect almost every organ in your body. Smoking puts you, and those around you, at risk for developing many serious chronic diseases. Quitting smoking is difficult, but it is one  of the best things that you can do for your health. It is never too late to quit. What are the benefits of quitting smoking? When you quit smoking, you lower your risk of developing serious diseases and conditions, such as:  Lung cancer or lung disease, such as COPD.  Heart disease.  Stroke.  Heart attack.  Infertility.  Osteoporosis and bone fractures. Additionally, symptoms such as coughing, wheezing, and shortness of breath may get better when you quit. You may also find that you get sick less often  because your body is stronger at fighting off colds and infections. If you are pregnant, quitting smoking can help to reduce your chances of having a baby of low birth weight. How do I get ready to quit? When you decide to quit smoking, create a plan to make sure that you are successful. Before you quit:  Pick a date to quit. Set a date within the next two weeks to give you time to prepare.  Write down the reasons why you are quitting. Keep this list in places where you will see it often, such as on your bathroom mirror or in your car or wallet.  Identify the people, places, things, and activities that make you want to smoke (triggers) and avoid them. Make sure to take these actions: ? Throw away all cigarettes at home, at work, and in your car. ? Throw away smoking accessories, such as Scientist, research (medical). ? Clean your car and make sure to empty the ashtray. ? Clean your home, including curtains and carpets.  Tell your family, friends, and coworkers that you are quitting. Support from your loved ones can make quitting easier.  Talk with your health care provider about your options for quitting smoking.  Find out what treatment options are covered by your health insurance. What strategies can I use to quit smoking? Talk with your healthcare provider about different strategies to quit smoking. Some strategies include:  Quitting smoking altogether instead of gradually lessening how much you smoke over a period of time. Research shows that quitting "cold [place]" is more successful than gradually quitting.  Attending in-person counseling to help you build problem-solving skills. You are more likely to have success in quitting if you attend several counseling sessions. Even short sessions of 10 minutes can be effective.  Finding resources and support systems that can help you to quit smoking and remain smoke-free after you quit. These resources are most helpful when you use them often. They  can include: ? Online chats with a Social worker. ? Telephone quitlines. ? Careers information officer. ? Support groups or group counseling. ? Text messaging programs. ? Mobile phone applications.  Taking medicines to help you quit smoking. (If you are pregnant or breastfeeding, talk with your health care provider first.) Some medicines contain nicotine and some do not. Both types of medicines help with cravings, but the medicines that include nicotine help to relieve withdrawal symptoms. Your health care provider may recommend: ? Nicotine patches, gum, or lozenges. ? Nicotine inhalers or sprays. ? Non-nicotine medicine that is taken by mouth. Talk with your health care provider about combining strategies, such as taking medicines while you are also receiving in-person counseling. Using these two strategies together makes you more likely to succeed in quitting than if you used either strategy on its own. If you are pregnant or breastfeeding, talk with your health care provider about finding counseling or other support strategies to quit smoking. Do not take medicine to help you  quit smoking unless told to do so by your health care provider. What things can I do to make it easier to quit? Quitting smoking might feel overwhelming at first, but there is a lot that you can do to make it easier. Take these important actions:  Reach out to your family and friends and ask that they support and encourage you during this time. Call telephone quitlines, reach out to support groups, or work with a counselor for support.  Ask people who smoke to avoid smoking around you.  Avoid places that trigger you to smoke, such as bars, parties, or smoke-break areas at work.  Spend time around people who do not smoke.  Lessen stress in your life, because stress can be a smoking trigger for some people. To lessen stress, try: ? Exercising regularly. ? Deep-breathing exercises. ? Yoga. ? Meditating. ? Performing a  body scan. This involves closing your eyes, scanning your body from head to toe, and noticing which parts of your body are particularly tense. Purposefully relax the muscles in those areas.  Download or purchase mobile phone or tablet apps (applications) that can help you stick to your quit plan by providing reminders, tips, and encouragement. There are many free apps, such as QuitGuide from the [employer] Office manager for Disease Control and Prevention). You can find other support for quitting smoking (smoking cessation) through smokefree.gov and other websites. How will I feel when I quit smoking? Within the first 24 hours of quitting smoking, you may start to feel some withdrawal symptoms. These symptoms are usually most noticeable 2-3 days after quitting, but they usually do not last beyond 2-3 weeks. Changes or symptoms that you might experience include:  Mood swings.  Restlessness, anxiety, or irritation.  Difficulty concentrating.  Dizziness.  Strong cravings for sugary foods in addition to nicotine.  Mild weight gain.  Constipation.  Nausea.  Coughing or a sore throat.  Changes in how your medicines work in your body.  A depressed mood.  Difficulty sleeping (insomnia). After the first 2-3 weeks of quitting, you may start to notice more positive results, such as:  Improved sense of smell and taste.  Decreased coughing and sore throat.  Slower heart rate.  Lower blood pressure.  Clearer skin.  The ability to breathe more easily.  Fewer sick days. Quitting smoking is very challenging for most people. Do not get discouraged if you are not successful the first time. Some people need to make many attempts to quit before they achieve long-term success. Do your best to stick to your quit plan, and talk with your health care provider if you have any questions or concerns. This information is not intended to replace advice given to you by your health care provider. Make sure you  discuss any questions you have with your health care provider. Document Released: 03/06/2001 Document Revised: 10/16/2016 Document Reviewed: 07/27/2014 Elsevier Interactive Patient Education  2019 [employer].

## 2018-05-01 LAB — CBC WITH DIFFERENTIAL/PLATELET
Absolute Monocytes: 440 cells/uL (ref 200–950)
BASOS PCT: 0.8 %
Basophils Absolute: 50 cells/uL (ref 0–200)
Eosinophils Absolute: 167 cells/uL (ref 15–500)
Eosinophils Relative: 2.7 %
HCT: 43.9 % (ref 38.5–50.0)
Hemoglobin: 14.7 g/dL (ref 13.2–17.1)
Lymphs Abs: 1947 cells/uL (ref 850–3900)
MCH: 31.3 pg (ref 27.0–33.0)
MCHC: 33.5 g/dL (ref 32.0–36.0)
MCV: 93.4 fL (ref 80.0–100.0)
MONOS PCT: 7.1 %
MPV: 11.3 fL (ref 7.5–12.5)
Neutro Abs: 3596 cells/uL (ref 1500–7800)
Neutrophils Relative %: 58 %
PLATELETS: 198 10*3/uL (ref 140–400)
RBC: 4.7 10*6/uL (ref 4.20–5.80)
RDW: 12.6 % (ref 11.0–15.0)
TOTAL LYMPHOCYTE: 31.4 %
WBC: 6.2 10*3/uL (ref 3.8–10.8)

## 2018-05-01 LAB — COMPLETE METABOLIC PANEL WITH GFR
AG Ratio: 1.8 (calc) (ref 1.0–2.5)
ALT: 47 U/L — AB (ref 9–46)
AST: 35 U/L (ref 10–35)
Albumin: 4.7 g/dL (ref 3.6–5.1)
Alkaline phosphatase (APISO): 84 U/L (ref 35–144)
BILIRUBIN TOTAL: 0.4 mg/dL (ref 0.2–1.2)
BUN: 17 mg/dL (ref 7–25)
CALCIUM: 9.8 mg/dL (ref 8.6–10.3)
CHLORIDE: 106 mmol/L (ref 98–110)
CO2: 27 mmol/L (ref 20–32)
Creat: 0.97 mg/dL (ref 0.70–1.33)
GFR, EST AFRICAN AMERICAN: 101 mL/min/{1.73_m2} (ref >=60)
GFR, Est Non African American: 87 mL/min/{1.73_m2} (ref >=60)
GLUCOSE: 86 mg/dL (ref 65–99)
Globulin: 2.6 g/dL (calc) (ref 1.9–3.7)
Potassium: 4.2 mmol/L (ref 3.5–5.3)
Sodium: 141 mmol/L (ref 135–146)
TOTAL PROTEIN: 7.3 g/dL (ref 6.1–8.1)

## 2018-05-01 LAB — LIPID PANEL
CHOLESTEROL: 158 mg/dL (ref <200)
HDL: 87 mg/dL (ref >=40)
LDL Cholesterol (Calc): 52 mg/dL (calc)
NON-HDL CHOLESTEROL (CALC): 71 mg/dL (ref <130)
TRIGLYCERIDES: 102 mg/dL (ref <150)
Total CHOL/HDL Ratio: 1.8 (calc) (ref <5.0)

## 2018-05-01 LAB — MAGNESIUM: Magnesium: 2 mg/dL (ref 1.5–2.5)

## 2018-05-01 LAB — TSH: TSH: 0.6 mIU/L (ref 0.40–4.50)

## 2018-06-14 ENCOUNTER — Other Ambulatory Visit: Payer: Self-pay | Admitting: Internal Medicine

## 2018-06-26 ENCOUNTER — Encounter: Payer: Self-pay | Admitting: Internal Medicine

## 2018-07-10 ENCOUNTER — Other Ambulatory Visit: Payer: Self-pay

## 2018-07-10 DIAGNOSIS — E782 Mixed hyperlipidemia: Secondary | ICD-10-CM

## 2018-07-10 MED ORDER — ATORVASTATIN CALCIUM 40 MG PO TABS: ORAL_TABLET | ORAL | 0 refills | Status: DC

## 2018-08-05 ENCOUNTER — Encounter: Payer: Self-pay | Admitting: Internal Medicine

## 2018-08-23 ENCOUNTER — Other Ambulatory Visit: Payer: Self-pay | Admitting: Internal Medicine

## 2018-08-23 DIAGNOSIS — K219 Gastro-esophageal reflux disease without esophagitis: Secondary | ICD-10-CM

## 2018-08-26 ENCOUNTER — Encounter: Payer: Self-pay | Admitting: Internal Medicine

## 2018-08-26 DIAGNOSIS — N401 Enlarged prostate with lower urinary tract symptoms: Secondary | ICD-10-CM | POA: Insufficient documentation

## 2018-08-26 DIAGNOSIS — R7309 Other abnormal glucose: Secondary | ICD-10-CM | POA: Insufficient documentation

## 2018-08-26 DIAGNOSIS — N138 Other obstructive and reflux uropathy: Secondary | ICD-10-CM | POA: Insufficient documentation

## 2018-08-26 NOTE — Progress Notes (Signed)
[facility]  Unk Pinto, M.D.        Uvaldo Bristle. Silverio Lay, P.A.-C         [name], [place]                [address] [place], [place] [address] Telephone [phone] Telefax [phone] Annual  Screening/Preventative Visit  & Comprehensive Evaluation & Examination     This very nice 58 y.o.  DWM  presents for a Screening /Preventative Visit & comprehensive evaluation and management of multiple medical co-morbidities.  Patient has been followed for HTN, ASCAD,  HLD, Prediabetes and Vitamin D Deficiency. Patient has GERD controlled with his Pantoprazole.      Due to his her long history of smoking  >over1&1/2 PPD for  38+ pk years,  I discussed lung cancer screening with him her. He was agreeable to undergo a screening low dose CT scan of the chest.  We discussed smoking cessation techniques/options. I will refer him her for a LDCT lung scan & lung cancer screening program     HTN predates since 2017 when he presented with an ACS undergoing PCA & DES by Dr Marlou Porch.  Patient's BP have since been controlled and today's BP is elevated at 170/92.  In May 2017, he presented with ACS and Had PCA with a DES planted by Dr Marlou Porch  for a 90% mid LAD. Patient denies any cardiac symptoms as chest pain, palpitations, shortness of breath, dizziness or ankle swelling.     Patient's hyperlipidemia is controlled with diet and Atorvastatin. Patient denies myalgias or other medication SE's. Last lipids were at goal: Lab Results  Component Value Date   CHOL 158 04/30/2018   HDL 87 04/30/2018   LDLCALC 52 04/30/2018   TRIG 102 04/30/2018   CHOLHDL 1.8 04/30/2018      Patient is followed expectantly for glucose intolerance  and patient denies reactive hypoglycemic symptoms, visual blurring, diabetic polys or paresthesias. Last A1c was Normal & at goal: Lab Results  Component Value Date   HGBA1C 5.3 01/23/2018        Finally, patient has history of Vitamin D Deficiency ("36"/ 2018)  and last vitamin D was at goal: Lab Results  Component Value Date   VD25OH 55 01/23/2018   Current Outpatient Medications on File Prior to Visit  Medication Sig  . aspirin EC 81 MG tablet Take 1 tablet (81 mg total) by mouth daily.  Marland Kitchen atenolol (TENORMIN) 25 MG tablet TAKE 1 TABLET BY MOUTH EVERY DAY  . atorvastatin (LIPITOR) 40 MG tablet TAKE 1 TABLET BY MOUTH DAILY FOR CHOLESTEROL  . Cholecalciferol 5000 units CHEW Chew 10,000 Units by mouth daily.  Marland Kitchen losartan (COZAAR) 100 MG tablet TAKE 1/2 TABLET (50MG ) BY MOUTH EVERY DAY  . nitroGLYCERIN (NITROSTAT) 0.4 MG SL tablet Place 1 tablet (0.4 mg total) under the tongue every 5 (five) minutes as needed for chest pain.  . pantoprazole (PROTONIX) 40 MG tablet Take 1 tablet Daily for Acid Indigestion & Reflux   No current facility-administered medications on file prior to visit.    No Known Allergies   Past Medical History:  Diagnosis Date  . Coronary artery disease    a. Cath ~ 25 yrs ago, dx w/ pleurisy;  b. 07/2015 Cath/PCI: LM nl, LAD 84m (2.75x24 Synergy DES), LCX nl, RCA nl.  Marland Kitchen GERD (gastroesophageal reflux  disease)   . Hypertensive heart disease   . Tobacco abuse    Health Maintenance  Topic Date Due  . COLONOSCOPY  06/14/2011  . Hepatitis C Screening  10/22/2018 (Originally [birth year])  . HIV Screening  10/22/2018 (Originally 06/13/1975)  . INFLUENZA VACCINE  10/25/2018  . TETANUS/TDAP  01/24/2026   Immunization History  Administered Date(s) Administered  . PPD Test 05/28/2017  . Tdap 01/25/2016   Last Colon - declined by patient in the past  Past Surgical History:  Procedure Laterality Date  . BACK SURGERY    . CARDIAC CATHETERIZATION N/A 08/11/2015   Procedure: Left Heart Cath and Coronary Angiography;  Surgeon: Jerline Pain, MD;  Location: [facility];  Service: Cardiovascular;  Laterality: N/A;  . CARDIAC CATHETERIZATION N/A 08/11/2015    Procedure: Coronary Stent Intervention;  Surgeon: Jerline Pain, MD;  Location: [facility];  Service: Cardiovascular;  Laterality: N/A;  . CARDIAC CATHETERIZATION N/A 08/11/2015   Procedure: Coronary Stent Intervention;  Surgeon: Jettie Booze, MD;  Location: [facility];  Service: Cardiovascular;  Laterality: N/A;  . LUMBAR DISC SURGERY     Family History  Problem Relation Age of Onset  . Stroke Father    Social History   Socioeconomic History  . Marital status: Duvorced  . Number of children: 2 daughters & 2 GC  Occupational History  . Not on file  Tobacco Use  . Smoking status: Current Every Day Smoker    Packs/day: 1.50    Years: 41.00    Pack years: 61.50    Types: Cigarettes    Start date: 63  . Smokeless tobacco: Never Used  Substance and Sexual Activity  . Alcohol use: Yes    Alcohol/week: 40.0 standard drinks    Types: 40 Shots of liquor per week    Comment: 08/11/2015 drinks 3-4 fifths of liquor a week  . Drug use: No  . Sexual activity: Not on file    ROS Constitutional: Denies fever, chills, weight loss/gain, headaches, insomnia,  night sweats or change in appetite. Does c/o fatigue. Eyes: Denies redness, blurred vision, diplopia, discharge, itchy or watery eyes.  ENT: Denies discharge, congestion, post nasal drip, epistaxis, sore throat, earache, hearing loss, dental pain, Tinnitus, Vertigo, Sinus pain or snoring.  Cardio: Denies chest pain, palpitations, irregular heartbeat, syncope, dyspnea, diaphoresis, orthopnea, PND, claudication or edema Respiratory: denies cough, dyspnea, DOE, pleurisy, hoarseness, laryngitis or wheezing.  Gastrointestinal: Denies dysphagia, heartburn, reflux, water brash, pain, cramps, nausea, vomiting, bloating, diarrhea, constipation, hematemesis, melena, hematochezia, jaundice or hemorrhoids Genitourinary: Denies dysuria, frequency, urgency, nocturia, hesitancy, discharge, hematuria or flank pain Musculoskeletal:  Denies arthralgia, myalgia, stiffness, Jt. Swelling, pain, limp or strain/sprain. Denies Falls. Skin: Denies puritis, rash, hives, warts, acne, eczema or change in skin lesion Neuro: No weakness, tremor, incoordination, spasms, paresthesia or pain Psychiatric: Denies confusion, memory loss or sensory loss. Denies Depression. Endocrine: Denies change in weight, skin, hair change, nocturia, and paresthesia, diabetic polys, visual blurring or hyper / hypo glycemic episodes.  Heme/Lymph: No excessive bleeding, bruising or enlarged lymph nodes.  Physical Exam  BP (!) 170/92   Pulse (!) 56   Temp (!) 97.2 F (36.2 C)   Resp 16   Ht 6\' 3"  (1.905 m)   Wt 195 lb 9.6 oz (88.7 kg)   BMI 24.45 kg/m   General Appearance: Well nourished and well groomed and in no apparent distress.  Eyes: PERRLA, EOMs, conjunctiva no swelling or erythema, normal fundi and  vessels. Sinuses: No frontal/maxillary tenderness ENT/Mouth: EACs patent / TMs  nl. Nares clear without erythema, swelling, mucoid exudates. Oral hygiene is good. No erythema, swelling, or exudate. Tongue normal, non-obstructing. Tonsils not swollen or erythematous. Hearing normal.  Neck: Supple, thyroid not palpable. No bruits, nodes or JVD. Respiratory: Respiratory effort normal.  BS equal and clear bilateral without rales, rhonci, wheezing or stridor. Cardio: Heart sounds are normal with regular rate and rhythm and no murmurs, rubs or gallops. Peripheral pulses are normal and equal bilaterally without edema. No aortic or femoral bruits. Chest: symmetric with normal excursions and percussion.  Abdomen: Soft, with Nl bowel sounds. Nontender, no guarding, rebound, hernias, masses, or organomegaly.  Lymphatics: Non tender without lymphadenopathy.  Musculoskeletal: Full ROM all peripheral extremities, joint stability, 5/5 strength, and normal gait. Skin: Warm and dry without rashes, lesions, cyanosis, clubbing or  ecchymosis.  Neuro: Cranial nerves  intact, reflexes equal bilaterally. Normal muscle tone, no cerebellar symptoms. Sensation intact.  Pysch: Alert and oriented X 3 with normal affect, insight and judgment appropriate.   Assessment and Plan  1. Annual Preventative/Screening Exam   2. Essential hypertension  - Advised to increase Losartan 100 mg to 1 whole tab daily and  also monitoring 2 x /day to call if remains elevated  - EKG 12-Lead - [place], RETROPERITNL ABD,  LTD - Urinalysis, Routine w reflex microscopic - Microalbumin / creatinine urine ratio - CBC with Differential/Platelet - COMPLETE METABOLIC PANEL WITH GFR - Magnesium - TSH  3. Hyperlipidemia, mixed  - EKG 12-Lead - [place], RETROPERITNL ABD,  LTD - Lipid panel - TSH  4. Abnormal glucose  - EKG 12-Lead - [place], RETROPERITNL ABD,  LTD - Hemoglobin A1c - Insulin, random  5. Vitamin D deficiency  - VITAMIN D 25 Hydroxyl  6. Coronary artery disease involving native coronary artery of native heart without angina pectoris  - Lipid panel  7. Gastroesophageal reflux disease  - CBC with Differential/Platelet  8. BPH with obstruction/lower urinary tract symptoms  - PSA  9. Screening for prostate cancer  - PSA  10. Screening for colorectal cancer  - POC Hemoccult Bld/Stl  11. Screening examination for pulmonary tuberculosis  - TB Skin Test  12. Encounter for screening for malignant neoplasm of respiratory organs  - CT CHEST LUNG CA SCREEN LOW DOSE W/O CM; Future  13. Screening for ischemic heart disease  - EKG 12-Lead  14. FH: cardiovascular disease  - EKG 12-Lead - [place], RETROPERITNL ABD,  LTD  15. History of smoking greater than 50 pack years  - EKG 12-Lead - [place], RETROPERITNL ABD,  LTD - CT CHEST LUNG CA SCREEN LOW DOSE W/O CM; Future  16. Screening for AAA (aortic abdominal aneurysm)  - [place], RETROPERITNL ABD,  LTD  17. Fatigue  - Iron,Total/Total Iron Binding Cap - Vitamin B12 - Testosterone - CBC with  Differential/Platelet - TSH  18. Medication management  - Urinalysis, Routine w reflex microscopic - Microalbumin / creatinine urine ratio - CBC with Differential/Platelet - COMPLETE METABOLIC PANEL WITH GFR - Magnesium - Lipid panel - TSH - Hemoglobin A1c - Insulin, random - VITAMIN D 25 Hydroxyl       Patient was counseled in prudent diet, weight control to achieve/maintain BMI less than 25, BP monitoring, regular exercise and medications as discussed.  Discussed med effects and SE's. Routine screening labs and tests as requested with regular follow-up as recommended. Over 40 minutes of exam, counseling, chart review and high complex critical decision making was  performed

## 2018-08-26 NOTE — Patient Instructions (Signed)
- Vit D  And Vit C 1,000 mg  are recommended to help protect  against the Covid_19 and other Corona viruses.   - Also it's recommended to take Zinc 50 mg to help  protect against the Covid_19  And best place to get  is also on [employer] and don't pay more than 6-8 cents /pill !   =============================== Coronavirus (COVID-19) Are you at risk?  Are you at risk for the Coronavirus (COVID-19)?  To be considered HIGH RISK for Coronavirus (COVID-19), you have to meet the following criteria:  . Traveled to [place], [place], [place], [place] or [place]; or in the [place] to [place], [place], [place]  . or [place]; and have fever, cough, and shortness of breath within the last 2 weeks of travel OR . Been in close contact with a person diagnosed with COVID-19 within the last 2 weeks and have  . fever, cough,and shortness of breath .  . IF YOU DO NOT MEET THESE CRITERIA, YOU ARE CONSIDERED LOW RISK FOR COVID-19.  What to do if you are HIGH RISK for COVID-19?  . If you are having a medical emergency, call 911. . Seek medical care right away. Before you go to a doctor's office, urgent care or emergency department, .  call ahead and tell them about your recent travel, contact with someone diagnosed with COVID-19  .  and your symptoms.  . You should receive instructions from your physician's office regarding next steps of care.  . When you arrive at healthcare provider, tell the healthcare staff immediately you have returned from  . visiting [place], [place], [place], [place] or [place]; or traveled in the [place] to [place], [place],  . [place] or [place] in the last two weeks or you have been in close contact with a person diagnosed with  . COVID-19 in the last 2 weeks.   . Tell the health care staff about your symptoms: fever, cough and shortness of breath. . After you have been seen by a medical provider, you will be either: o Tested for (COVID-19) and  discharged home on quarantine except to seek medical care if  o symptoms worsen, and asked to  - Stay home and avoid contact with others until you get your results (4-5 days)  - Avoid travel on public transportation if possible (such as bus, train, or airplane) or o Sent to the Emergency Department by EMS for evaluation, COVID-19 testing  and  o possible admission depending on your condition and test results.  What to do if you are LOW RISK for COVID-19?  Reduce your risk of any infection by using the same precautions used for avoiding the common cold or flu:  . Wash your hands often with soap and warm water for at least 20 seconds.  If soap and water are not readily available,  . use an alcohol-based hand sanitizer with at least 60% alcohol.  . If coughing or sneezing, cover your mouth and nose by coughing or sneezing into the elbow areas of your shirt or coat, .  into a tissue or into your sleeve (not your hands). . Avoid shaking hands with others and consider head nods or verbal greetings only. . Avoid touching your eyes, nose, or mouth with unwashed hands.  . Avoid close contact with people who are sick. . Avoid places or events with large numbers of people in one location, like concerts or sporting events. . Carefully consider travel plans   you have or are making. . If you are planning any travel outside or inside the [place], visit the CDC's Travelers' Health webpage for the latest health notices. . If you have some symptoms but not all symptoms, continue to monitor at home and seek medical attention  . if your symptoms worsen. . If you are having a medical emergency, call 911. >>>>>>>>>>>>>>>>>>>>>>>>>>>> Preventive Care for Adults  A healthy lifestyle and preventive care can promote health and wellness. Preventive health guidelines for men include the following key practices:  A routine yearly physical is a good way to check with your health care provider about your health and  preventative screening. It is a chance to share any concerns and updates on your health and to receive a thorough exam.  Visit your dentist for a routine exam and preventative care every 6 months. Brush your teeth twice a day and floss once a day. Good oral hygiene prevents tooth decay and gum disease.  The frequency of eye exams is based on your age, health, family medical history, use of contact lenses, and other factors. Follow your health care provider's recommendations for frequency of eye exams.  Eat a healthy diet. Foods such as vegetables, fruits, whole grains, low-fat dairy products, and lean protein foods contain the nutrients you need without too many calories. Decrease your intake of foods high in solid fats, added sugars, and salt. Eat the right amount of calories for you. Get information about a proper diet from your health care provider, if necessary.  Regular physical exercise is one of the most important things you can do for your health. Most adults should get at least 150 minutes of moderate-intensity exercise (any activity that increases your heart rate and causes you to sweat) each week. In addition, most adults need muscle-strengthening exercises on 2 or more days a week.  Maintain a healthy weight. The body mass index (BMI) is a screening tool to identify possible weight problems. It provides an estimate of body fat based on height and weight. Your health care provider can find your BMI and can help you achieve or maintain a healthy weight. For adults 20 years and older:  A BMI below 18.5 is considered underweight.  A BMI of 18.5 to 24.9 is normal.  A BMI of 25 to 29.9 is considered overweight.  A BMI of 30 and above is considered obese.  Maintain normal blood lipids and cholesterol levels by exercising and minimizing your intake of saturated fat. Eat a balanced diet with plenty of fruit and vegetables. Blood tests for lipids and cholesterol should begin at age 20 and be  repeated every 5 years. If your lipid or cholesterol levels are high, you are over 50, or you are at high risk for heart disease, you may need your cholesterol levels checked more frequently. Ongoing high lipid and cholesterol levels should be treated with medicines if diet and exercise are not working.  If you smoke, find out from your health care provider how to quit. If you do not use tobacco, do not start.  Lung cancer screening is recommended for adults aged 55-80 years who are at high risk for developing lung cancer because of a history of smoking. A yearly low-dose CT scan of the lungs is recommended for people who have at least a 30-pack-year history of smoking and are a current smoker or have quit within the past 15 years. A pack year of smoking is smoking an average of 1 pack of cigarettes a   day for 1 year (for example: 1 pack a day for 30 years or 2 packs a day for 15 years). Yearly screening should continue until the smoker has stopped smoking for at least 15 years. Yearly screening should be stopped for people who develop a health problem that would prevent them from having lung cancer treatment.  If you choose to drink alcohol, do not have more than 2 drinks per day. One drink is considered to be 12 ounces (355 mL) of beer, 5 ounces (148 mL) of wine, or 1.5 ounces (44 mL) of liquor.  Avoid use of street drugs. Do not share needles with anyone. Ask for help if you need support or instructions about stopping the use of drugs.  High blood pressure causes heart disease and increases the risk of stroke. Your blood pressure should be checked at least every 1-2 years. Ongoing high blood pressure should be treated with medicines, if weight loss and exercise are not effective.  If you are 45-79 years old, ask your health care provider if you should take aspirin to prevent heart disease.  Diabetes screening involves taking a blood sample to check your fasting blood sugar level. This should be done  once every 3 years, after age 45, if you are within normal weight and without risk factors for diabetes. Testing should be considered at a younger age or be carried out more frequently if you are overweight and have at least 1 risk factor for diabetes.  Colorectal cancer can be detected and often prevented. Most routine colorectal cancer screening begins at the age of 50 and continues through age 75. However, your health care provider may recommend screening at an earlier age if you have risk factors for colon cancer. On a yearly basis, your health care provider may provide home test kits to check for hidden blood in the stool. Use of a small camera at the end of a tube to directly examine the colon (sigmoidoscopy or colonoscopy) can detect the earliest forms of colorectal cancer. Talk to your health care provider about this at age 50, when routine screening begins. Direct exam of the colon should be repeated every 5-10 years through age 75, unless early forms of precancerous polyps or small growths are found.   Talk with your health care provider about prostate cancer screening.  Testicular cancer screening isrecommended for adult males. Screening includes self-exam, a health care provider exam, and other screening tests. Consult with your health care provider about any symptoms you have or any concerns you have about testicular cancer.  Use sunscreen. Apply sunscreen liberally and repeatedly throughout the day. You should seek shade when your shadow is shorter than you. Protect yourself by wearing long sleeves, pants, a wide-brimmed hat, and sunglasses year round, whenever you are outdoors.  Once a month, do a whole-body skin exam, using a mirror to look at the skin on your back. Tell your health care provider about new moles, moles that have irregular borders, moles that are larger than a pencil eraser, or moles that have changed in shape or color.  Stay current with required vaccines  (immunizations).  Influenza vaccine. All adults should be immunized every year.  Tetanus, diphtheria, and acellular pertussis (Td, Tdap) vaccine. An adult who has not previously received Tdap or who does not know his vaccine status should receive 1 dose of Tdap. This initial dose should be followed by tetanus and diphtheria toxoids (Td) booster doses every 10 years. Adults with an unknown or incomplete history   of completing a 3-dose immunization series with Td-containing vaccines should begin or complete a primary immunization series including a Tdap dose. Adults should receive a Td booster every 10 years.  Varicella vaccine. An adult without evidence of immunity to varicella should receive 2 doses or a second dose if he has previously received 1 dose.  Human papillomavirus (HPV) vaccine. Males aged 13-21 years who have not received the vaccine previously should receive the 3-dose series. Males aged 22-26 years may be immunized. Immunization is recommended through the age of 26 years for any male who has sex with males and did not get any or all doses earlier. Immunization is recommended for any person with an immunocompromised condition through the age of 26 years if he did not get any or all doses earlier. During the 3-dose series, the second dose should be obtained 4-8 weeks after the first dose. The third dose should be obtained 24 weeks after the first dose and 16 weeks after the second dose.  Zoster vaccine. One dose is recommended for adults aged 60 years or older unless certain conditions are present.    PREVNAR  - Pneumococcal 13-valent conjugate (PCV13) vaccine. When indicated, a person who is uncertain of his immunization history and has no record of immunization should receive the PCV13 vaccine. An adult aged 19 years or older who has certain medical conditions and has not been previously immunized should receive 1 dose of PCV13 vaccine. This PCV13 should be followed with a dose of  pneumococcal polysaccharide (PPSV23) vaccine. The PPSV23 vaccine dose should be obtained at least 1 r more year(s) after the dose of PCV13 vaccine. An adult aged 19 years or older who has certain medical conditions and previously received 1 or more doses of PPSV23 vaccine should receive 1 dose of PCV13. The PCV13 vaccine dose should be obtained 1 or more years after the last PPSV23 vaccine dose.    PNEUMOVAX - Pneumococcal polysaccharide (PPSV23) vaccine. When PCV13 is also indicated, PCV13 should be obtained first. All adults aged 65 years and older should be immunized. An adult younger than age 65 years who has certain medical conditions should be immunized. Any person who resides in a nursing home or long-term care facility should be immunized. An adult smoker should be immunized. People with an immunocompromised condition and certain other conditions should receive both PCV13 and PPSV23 vaccines. People with human immunodeficiency virus (HIV) infection should be immunized as soon as possible after diagnosis. Immunization during chemotherapy or radiation therapy should be avoided. Routine use of PPSV23 vaccine is not recommended for American Indians, [place] Natives, or people younger than 65 years unless there are medical conditions that require PPSV23 vaccine. When indicated, people who have unknown immunization and have no record of immunization should receive PPSV23 vaccine. One-time revaccination 5 years after the first dose of PPSV23 is recommended for people aged 19-64 years who have chronic kidney failure, nephrotic syndrome, asplenia, or immunocompromised conditions. People who received 1-2 doses of PPSV23 before age 65 years should receive another dose of PPSV23 vaccine at age 65 years or later if at least 5 years have passed since the previous dose. Doses of PPSV23 are not needed for people immunized with PPSV23 at or after age 65 years.    Hepatitis A vaccine. Adults who wish to be protected  from this disease, have certain high-risk conditions, work with hepatitis A-infected animals, work in hepatitis A research labs, or travel to or work in countries with a high rate of   hepatitis A should be immunized. Adults who were previously unvaccinated and who anticipate close contact with an international adoptee during the first 60 days after arrival in the [place] from a country with a high rate of hepatitis A should be immunized.    Hepatitis B vaccine. Adults should be immunized if they wish to be protected from this disease, have certain high-risk conditions, may be exposed to blood or other infectious body fluids, are household contacts or sex partners of hepatitis B positive people, are clients or workers in certain care facilities, or travel to or work in countries with a high rate of hepatitis B.   Preventive Service / Frequency   Ages 40 to 64  Blood pressure check.  Lipid and cholesterol check  Lung cancer screening. / Every year if you are aged 55-80 years and have a 30-pack-year history of smoking and currently smoke or have quit within the past 15 years. Yearly screening is stopped once you have quit smoking for at least 15 years or develop a health problem that would prevent you from having lung cancer treatment.  Fecal occult blood test (FOBT) of stool. / Every year beginning at age 50 and continuing until age 75. You may not have to do this test if you get a colonoscopy every 10 years.  Flexible sigmoidoscopy** or colonoscopy.** / Every 5 years for a flexible sigmoidoscopy or every 10 years for a colonoscopy beginning at age 50 and continuing until age 75. Screening for abdominal aortic aneurysm (AAA)  by ultrasound is recommended for people who have history of high blood pressure or who are current or former smokers. +++++++++++ Recommend Adult Low Dose Aspirin or  coated  Aspirin 81 mg daily  To reduce risk of Colon Cancer 40 %,  Skin Cancer 26 % ,  Malignant  Melanoma 46%  and  Pancreatic cancer 60% ++++++++++++++++++++ Vitamin D goal  is between 70-100.  Please make sure that you are taking your Vitamin D as directed.  It is very important as a natural anti-inflammatory  helping hair, skin, and nails, as well as reducing stroke and heart attack risk.  It helps your bones and helps with mood. It also decreases numerous cancer risks so please take it as directed.  Low Vit D is associated with a 200-300% higher risk for CANCER  and 200-300% higher risk for HEART   ATTACK  &  STROKE.   ...................................... It is also associated with higher death rate at younger ages,  autoimmune diseases like Rheumatoid arthritis, Lupus, Multiple Sclerosis.    Also many other serious conditions, like depression, Alzheimer's Dementia, infertility, muscle aches, fatigue, fibromyalgia - just to name a few. +++++++++++++++++++++ Recommend the book "The END of DIETING" by Dr Joel Fuhrman  & the book "The END of DIABETES " by Dr Joel Fuhrman At [employer] - get book & Audio CD's    Being diabetic has a  300% increased risk for heart attack, stroke, cancer, and alzheimer- type vascular dementia. It is very important that you work harder with diet by avoiding all foods that are white. Avoid white rice (brown & wild rice is OK), white potatoes (sweetpotatoes in moderation is OK), White bread or wheat bread or anything made out of white flour like bagels, donuts, rolls, buns, biscuits, cakes, pastries, cookies, pizza crust, and pasta (made from white flour & egg whites) - vegetarian pasta or spinach or wheat pasta is OK. Multigrain breads like Arnold's or [employer], or multigrain sandwich thins   or flatbreads.  Diet, exercise and weight loss can reverse and cure diabetes in the early stages.  Diet, exercise and weight loss is very important in the control and prevention of complications of diabetes which affects every system in your body, ie. Brain -  dementia/stroke, eyes - glaucoma/blindness, heart - heart attack/heart failure, kidneys - dialysis, stomach - gastric paralysis, intestines - malabsorption, nerves - severe painful neuritis, circulation - gangrene & loss of a leg(s), and finally cancer and Alzheimers.    I recommend avoid fried & greasy foods,  sweets/candy, white rice (brown or wild rice or Quinoa is OK), white potatoes (sweet potatoes are OK) - anything made from white flour - bagels, doughnuts, rolls, buns, biscuits,white and wheat breads, pizza crust and traditional pasta made of white flour & egg white(vegetarian pasta or spinach or wheat pasta is OK).  Multi-grain bread is OK - like multi-grain flat bread or sandwich thins. Avoid alcohol in excess. Exercise is also important.    Eat all the vegetables you want - avoid meat, especially red meat and dairy - especially cheese.  Cheese is the most concentrated form of trans-fats which is the worst thing to clog up our arteries. Veggie cheese is OK which can be found in the fresh produce section at [employer] or [employer] or [employer]  ++++++++++++++++++++++ DASH Eating Plan  DASH stands for "Dietary Approaches to Stop Hypertension."   The DASH eating plan is a healthy eating plan that has been shown to reduce high blood pressure (hypertension). Additional health benefits may include reducing the risk of type 2 diabetes mellitus, heart disease, and stroke. The DASH eating plan may also help with weight loss. WHAT DO I NEED TO KNOW ABOUT THE DASH EATING PLAN? For the DASH eating plan, you will follow these general guidelines:  Choose foods with a percent daily value for sodium of less than 5% (as listed on the food label).  Use salt-free seasonings or herbs instead of table salt or sea salt.  Check with your health care provider or pharmacist before using salt substitutes.  Eat lower-sodium products, often labeled as "lower sodium" or "no salt added."  Eat fresh  foods.  Eat more vegetables, fruits, and low-fat dairy products.  Choose whole grains. Look for the word "whole" as the first word in the ingredient list.  Choose fish   Limit sweets, desserts, sugars, and sugary drinks.  Choose heart-healthy fats.  Eat veggie cheese   Eat more home-cooked food and less restaurant, buffet, and fast food.  Limit fried foods.  Cook foods using methods other than frying.  Limit canned vegetables. If you do use them, rinse them well to decrease the sodium.  When eating at a restaurant, ask that your food be prepared with less salt, or no salt if possible.                      WHAT FOODS CAN I EAT? Read Dr Joel Fuhrman's books on The End of Dieting & The End of Diabetes  Grains Whole grain or whole wheat bread. Brown rice. Whole grain or whole wheat pasta. Quinoa, bulgur, and whole grain cereals. Low-sodium cereals. Corn or whole wheat flour tortillas. Whole grain cornbread. Whole grain crackers. Low-sodium crackers.  Vegetables Fresh or frozen vegetables (raw, steamed, roasted, or grilled). Low-sodium or reduced-sodium tomato and vegetable juices. Low-sodium or reduced-sodium tomato sauce and paste. Low-sodium or reduced-sodium canned vegetables.   Fruits All fresh, canned (in natural juice), or   frozen fruits.  Protein Products  All fish and seafood.  Dried beans, peas, or lentils. Unsalted nuts and seeds. Unsalted canned beans.  Dairy Low-fat dairy products, such as skim or 1% milk, 2% or reduced-fat cheeses, low-fat ricotta or cottage cheese, or plain low-fat yogurt. Low-sodium or reduced-sodium cheeses.  Fats and Oils Tub margarines without trans fats. Light or reduced-fat mayonnaise and salad dressings (reduced sodium). Avocado. Safflower, olive, or canola oils. Natural peanut or almond butter.  Other Unsalted popcorn and pretzels. The items listed above may not be a complete list of recommended foods or beverages. Contact your  dietitian for more options.  +++++++++++++++++++  WHAT FOODS ARE NOT RECOMMENDED? Grains/ White flour or wheat flour White bread. White pasta. White rice. Refined cornbread. Bagels and croissants. Crackers that contain trans fat.  Vegetables  Creamed or fried vegetables. Vegetables in a . Regular canned vegetables. Regular canned tomato sauce and paste. Regular tomato and vegetable juices.  Fruits Dried fruits. Canned fruit in light or heavy syrup. Fruit juice.  Meat and Other Protein Products Meat in general - RED meat & White meat.  Fatty cuts of meat. Ribs, chicken wings, all processed meats as bacon, sausage, bologna, salami, fatback, hot dogs, bratwurst and packaged luncheon meats.  Dairy Whole or 2% milk, cream, half-and-half, and cream cheese. Whole-fat or sweetened yogurt. Full-fat cheeses or blue cheese. Non-dairy creamers and whipped toppings. Processed cheese, cheese spreads, or cheese curds.  Condiments Onion and garlic salt, seasoned salt, table salt, and sea salt. Canned and packaged gravies. Worcestershire sauce. Tartar sauce. Barbecue sauce. Teriyaki sauce. Soy sauce, including reduced sodium. Steak sauce. Fish sauce. Oyster sauce. Cocktail sauce. Horseradish. Ketchup and mustard. Meat flavorings and tenderizers. Bouillon cubes. Hot sauce. Tabasco sauce. Marinades. Taco seasonings. Relishes.  Fats and Oils Butter, stick margarine, lard, shortening and bacon fat. Coconut, palm kernel, or palm oils. Regular salad dressings.  Pickles and olives. Salted popcorn and pretzels.  The items listed above may not be a complete list of foods and beverages to avoid.    

## 2018-08-27 ENCOUNTER — Ambulatory Visit: Payer: 59 | Admitting: Internal Medicine

## 2018-08-27 ENCOUNTER — Other Ambulatory Visit: Payer: Self-pay

## 2018-08-27 VITALS — BP 170/92 | HR 56 | Temp 97.2°F | Resp 16 | Ht 75.0 in | Wt 195.6 lb

## 2018-08-27 DIAGNOSIS — I10 Essential (primary) hypertension: Secondary | ICD-10-CM | POA: Diagnosis not present

## 2018-08-27 DIAGNOSIS — E559 Vitamin D deficiency, unspecified: Secondary | ICD-10-CM

## 2018-08-27 DIAGNOSIS — R7309 Other abnormal glucose: Secondary | ICD-10-CM

## 2018-08-27 DIAGNOSIS — I2510 Atherosclerotic heart disease of native coronary artery without angina pectoris: Secondary | ICD-10-CM

## 2018-08-27 DIAGNOSIS — Z111 Encounter for screening for respiratory tuberculosis: Secondary | ICD-10-CM | POA: Diagnosis not present

## 2018-08-27 DIAGNOSIS — Z0001 Encounter for general adult medical examination with abnormal findings: Secondary | ICD-10-CM

## 2018-08-27 DIAGNOSIS — N401 Enlarged prostate with lower urinary tract symptoms: Secondary | ICD-10-CM

## 2018-08-27 DIAGNOSIS — Z122 Encounter for screening for malignant neoplasm of respiratory organs: Secondary | ICD-10-CM

## 2018-08-27 DIAGNOSIS — Z125 Encounter for screening for malignant neoplasm of prostate: Secondary | ICD-10-CM

## 2018-08-27 DIAGNOSIS — Z79899 Other long term (current) drug therapy: Secondary | ICD-10-CM

## 2018-08-27 DIAGNOSIS — Z1211 Encounter for screening for malignant neoplasm of colon: Secondary | ICD-10-CM

## 2018-08-27 DIAGNOSIS — R5383 Other fatigue: Secondary | ICD-10-CM

## 2018-08-27 DIAGNOSIS — Z87891 Personal history of nicotine dependence: Secondary | ICD-10-CM

## 2018-08-27 DIAGNOSIS — Z136 Encounter for screening for cardiovascular disorders: Secondary | ICD-10-CM | POA: Diagnosis not present

## 2018-08-27 DIAGNOSIS — Z0000 Encounter for general adult medical examination without abnormal findings: Secondary | ICD-10-CM

## 2018-08-27 DIAGNOSIS — K219 Gastro-esophageal reflux disease without esophagitis: Secondary | ICD-10-CM

## 2018-08-27 DIAGNOSIS — Z8249 Family history of ischemic heart disease and other diseases of the circulatory system: Secondary | ICD-10-CM

## 2018-08-27 DIAGNOSIS — N138 Other obstructive and reflux uropathy: Secondary | ICD-10-CM

## 2018-08-27 DIAGNOSIS — E782 Mixed hyperlipidemia: Secondary | ICD-10-CM

## 2018-08-28 LAB — COMPLETE METABOLIC PANEL WITH GFR
AG Ratio: 1.9 (calc) (ref 1.0–2.5)
ALT: 43 U/L (ref 9–46)
AST: 32 U/L (ref 10–35)
Albumin: 4.9 g/dL (ref 3.6–5.1)
Alkaline phosphatase (APISO): 73 U/L (ref 35–144)
BUN: 17 mg/dL (ref 7–25)
CO2: 26 mmol/L (ref 20–32)
Calcium: 10 mg/dL (ref 8.6–10.3)
Chloride: 104 mmol/L (ref 98–110)
Creat: 0.99 mg/dL (ref 0.70–1.33)
GFR, Est African American: 98 mL/min/{1.73_m2} (ref >=60)
GFR, Est Non African American: 84 mL/min/{1.73_m2} (ref >=60)
Globulin: 2.6 g/dL (calc) (ref 1.9–3.7)
Glucose, Bld: 100 mg/dL — ABNORMAL HIGH (ref 65–99)
Potassium: 4.3 mmol/L (ref 3.5–5.3)
Sodium: 140 mmol/L (ref 135–146)
Total Bilirubin: 1 mg/dL (ref 0.2–1.2)
Total Protein: 7.5 g/dL (ref 6.1–8.1)

## 2018-08-28 LAB — CBC WITH DIFFERENTIAL/PLATELET
Absolute Monocytes: 525 cells/uL (ref 200–950)
Basophils Absolute: 60 cells/uL (ref 0–200)
Basophils Relative: 0.8 %
Eosinophils Absolute: 203 cells/uL (ref 15–500)
Eosinophils Relative: 2.7 %
HCT: 45.7 % (ref 38.5–50.0)
Hemoglobin: 15.1 g/dL (ref 13.2–17.1)
Lymphs Abs: 2288 cells/uL (ref 850–3900)
MCH: 31.1 pg (ref 27.0–33.0)
MCHC: 33 g/dL (ref 32.0–36.0)
MCV: 94.2 fL (ref 80.0–100.0)
MPV: 11 fL (ref 7.5–12.5)
Monocytes Relative: 7 %
Neutro Abs: 4425 cells/uL (ref 1500–7800)
Neutrophils Relative %: 59 %
Platelets: 204 10*3/uL (ref 140–400)
RBC: 4.85 10*6/uL (ref 4.20–5.80)
RDW: 12.8 % (ref 11.0–15.0)
Total Lymphocyte: 30.5 %
WBC: 7.5 10*3/uL (ref 3.8–10.8)

## 2018-08-28 LAB — MICROALBUMIN / CREATININE URINE RATIO
Creatinine, Urine: 206 mg/dL (ref 20–320)
Microalb Creat Ratio: 4 mcg/mg creat (ref <30)
Microalb, Ur: 0.8 mg/dL

## 2018-08-28 LAB — URINALYSIS, ROUTINE W REFLEX MICROSCOPIC
Bilirubin Urine: NEGATIVE
Glucose, UA: NEGATIVE
Hgb urine dipstick: NEGATIVE
Ketones, ur: NEGATIVE
Leukocytes,Ua: NEGATIVE
Nitrite: NEGATIVE
Protein, ur: NEGATIVE
Specific Gravity, Urine: 1.028 (ref 1.001–1.03)
pH: <=5 (ref 5.0–8.0)

## 2018-08-28 LAB — LIPID PANEL
Cholesterol: 168 mg/dL (ref <200)
HDL: 84 mg/dL (ref >=40)
LDL Cholesterol (Calc): 60 mg/dL (calc)
Non-HDL Cholesterol (Calc): 84 mg/dL (calc) (ref <130)
Total CHOL/HDL Ratio: 2 (calc) (ref <5.0)
Triglycerides: 161 mg/dL — ABNORMAL HIGH (ref <150)

## 2018-08-28 LAB — PSA: PSA: 0.6 ng/mL (ref <=4.0)

## 2018-08-28 LAB — TSH: TSH: 0.99 mIU/L (ref 0.40–4.50)

## 2018-08-28 LAB — IRON, TOTAL/TOTAL IRON BINDING CAP
%SAT: 35 % (calc) (ref 20–48)
Iron: 118 ug/dL (ref 50–180)
TIBC: 338 mcg/dL (calc) (ref 250–425)

## 2018-08-28 LAB — INSULIN, RANDOM: Insulin: 2.3 u[IU]/mL

## 2018-08-28 LAB — VITAMIN D 25 HYDROXY (VIT D DEFICIENCY, FRACTURES): Vit D, 25-Hydroxy: 66 ng/mL (ref 30–100)

## 2018-08-28 LAB — TESTOSTERONE: Testosterone: 253 ng/dL (ref 250–827)

## 2018-08-28 LAB — VITAMIN B12: Vitamin B-12: 304 pg/mL (ref 200–1100)

## 2018-08-28 LAB — HEMOGLOBIN A1C
Hgb A1c MFr Bld: 5.3 % of total Hgb (ref <5.7)
Mean Plasma Glucose: 105 (calc)
eAG (mmol/L): 5.8 (calc)

## 2018-08-28 LAB — MAGNESIUM: Magnesium: 2.1 mg/dL (ref 1.5–2.5)

## 2018-09-01 LAB — TB SKIN TEST
Induration: 0 mm
TB Skin Test: NEGATIVE

## 2018-09-04 ENCOUNTER — Other Ambulatory Visit: Payer: Self-pay | Admitting: Internal Medicine

## 2018-09-04 DIAGNOSIS — I10 Essential (primary) hypertension: Secondary | ICD-10-CM

## 2018-09-04 MED ORDER — MINOXIDIL 10 MG PO TABS: ORAL_TABLET | ORAL | 1 refills | Status: DC

## 2018-09-07 ENCOUNTER — Other Ambulatory Visit: Payer: Self-pay | Admitting: Internal Medicine

## 2018-09-17 ENCOUNTER — Other Ambulatory Visit: Payer: Self-pay

## 2018-09-17 ENCOUNTER — Other Ambulatory Visit: Payer: Self-pay | Admitting: Internal Medicine

## 2018-09-17 ENCOUNTER — Ambulatory Visit: Payer: 59 | Admitting: Internal Medicine

## 2018-09-17 VITALS — BP 138/72 | HR 72 | Temp 97.6°F | Resp 16 | Ht 75.0 in | Wt 199.4 lb

## 2018-09-17 DIAGNOSIS — I10 Essential (primary) hypertension: Secondary | ICD-10-CM | POA: Diagnosis not present

## 2018-09-17 NOTE — Progress Notes (Signed)
   Subjective:    Patient ID: Corey Collier, male    DOB: 09/20/1960, 58 y.o.   MRN: [id number]  HPI    This very nice 58 y.o.  DWM  with HTN, ASCAD,  HLD, Prediabetes,  GERD and Vitamin D Deficiency returns for f/u after increasing his Losartan 100 mg to 1 whole tablet. Patient reports his BP's have been normal & he's feeling better w/o c/o HA's , postural dizziness, CP, palpitations or dependent edema.   Medication Sig  . aspirin EC 81 MG tablet Take 1 tablet (81 mg total) by mouth daily.  Marland Kitchen atenolol (TENORMIN) 25 MG tablet TAKE 1 TABLET BY MOUTH EVERY DAY  . atorvastatin (LIPITOR) 40 MG tablet TAKE 1 TABLET BY MOUTH DAILY FOR CHOLESTEROL  . Cholecalciferol 5000 units CHEW Chew 10,000 Units by mouth daily.  Marland Kitchen losartan (COZAAR) 100 MG tablet Take 1 whole tablet Daily for BP  . minoxidil (LONITEN) 10 MG tablet Take 1/2 to 1 tablet Daily for BP  . nitroGLYCERIN (NITROSTAT) 0.4 MG SL tablet Place 1 tablet (0.4 mg total) under the tongue every 5 (five) minutes as needed for chest pain.  . pantoprazole (PROTONIX) 40 MG tablet Take 1 tablet Daily for Acid Indigestion & Reflux   No Known Allergies   Past Medical History:  Diagnosis Date  . Coronary artery disease    a. Cath ~ 25 yrs ago, dx w/ pleurisy;  b. 07/2015 Cath/PCI: LM nl, LAD 62m (2.75x24 Synergy DES), LCX nl, RCA nl.  Marland Kitchen GERD (gastroesophageal reflux disease)   . Hypertensive heart disease   . Tobacco abuse    Review of Systems   10 point systems review negative except as above.    Objective:   Physical Exam  BP 138/72   Pulse 72   Temp 97.6 F (36.4 C)   Resp 16   Ht 6\' 3"  (1.905 m)   Wt 199 lb 6.4 oz (90.4 kg)   BMI 24.92 kg/m   HEENT - WNL. Neck - supple.  Chest - Clear equal BS. Cor - Nl HS. RRR w/o sig MGR. PP 1(+). No edema. MS- FROM w/o deformities.  Gait Nl. Neuro -  Nl w/o focal abnormalities.    Assessment & Plan:   1. Essential hypertension  - discussed meds & SE's and periodic BP monitoring.  Between 15-20 minutes of exam, counseling, chart review, and decision making was performed

## 2018-09-19 ENCOUNTER — Encounter: Payer: Self-pay | Admitting: Internal Medicine

## 2018-10-02 ENCOUNTER — Other Ambulatory Visit: Payer: Self-pay | Admitting: Physician Assistant

## 2018-10-02 DIAGNOSIS — E782 Mixed hyperlipidemia: Secondary | ICD-10-CM

## 2018-10-29 ENCOUNTER — Other Ambulatory Visit: Payer: Self-pay | Admitting: Adult Health

## 2018-10-31 ENCOUNTER — Other Ambulatory Visit: Payer: Self-pay | Admitting: Internal Medicine

## 2018-11-03 ENCOUNTER — Ambulatory Visit: Payer: 59 | Admitting: Internal Medicine

## 2018-11-03 ENCOUNTER — Encounter: Payer: Self-pay | Admitting: Internal Medicine

## 2018-11-03 ENCOUNTER — Other Ambulatory Visit: Payer: Self-pay

## 2018-11-03 VITALS — BP 130/84 | HR 60 | Temp 97.7°F | Resp 16 | Ht 75.0 in | Wt 197.8 lb

## 2018-11-03 DIAGNOSIS — R079 Chest pain, unspecified: Secondary | ICD-10-CM | POA: Diagnosis not present

## 2018-11-03 DIAGNOSIS — R12 Heartburn: Secondary | ICD-10-CM

## 2018-11-03 DIAGNOSIS — K219 Gastro-esophageal reflux disease without esophagitis: Secondary | ICD-10-CM

## 2018-11-03 DIAGNOSIS — R0609 Other forms of dyspnea: Secondary | ICD-10-CM

## 2018-11-03 DIAGNOSIS — Z136 Encounter for screening for cardiovascular disorders: Secondary | ICD-10-CM

## 2018-11-03 DIAGNOSIS — R0600 Dyspnea, unspecified: Secondary | ICD-10-CM

## 2018-11-03 DIAGNOSIS — I2510 Atherosclerotic heart disease of native coronary artery without angina pectoris: Secondary | ICD-10-CM

## 2018-11-03 DIAGNOSIS — E349 Endocrine disorder, unspecified: Secondary | ICD-10-CM

## 2018-11-03 MED ORDER — TESTOSTERONE CYPIONATE 200 MG/ML IM SOLN: INTRAMUSCULAR | 2 refills | Status: DC

## 2018-11-03 NOTE — Progress Notes (Signed)
Subjective:    Patient ID: Corey Collier, male    DOB: Jul 18, 1960, 58 y.o.   MRN: [id number]  HPI      Patient is a  very nice 58 y.o. DWM  with HTN, ASCAD, HLD, Pre-Diabetes and Vitamin D Deficiency who presents today  as an emergency work-in today after calling and c/o Chest Pains. In May 2017, he underwent PCA/DES by Dr Marlou Porch. Patient had called several times over the weekend with sx's of dizziness & BP's were normal. He had been on Minoxidil short term , but stopped it after reading in package insert that it could cause chest pains. He doesn't describe CP as much as he reports DOE with hills & stairs. He does not feel that he could walk a standard treadmill stress test. He keeps mentioning that he just feels weak. He admits having more heartburn & reflux at night. He also expresses concerns re; hid low testosterone level of 253 at last check 2 months ago & desires to try replacement therapy to see it improves his energy levels.  Medication Sig  . aspirin EC 81 MG tablet Take 1 tablet (81 mg total) by mouth daily.  Marland Kitchen atenolol (TENORMIN) 25 MG tablet Take 1 tablet Daily for BP  . atorvastatin (LIPITOR) 40 MG tablet Take 1 tablet Daily for Cholesterol  . Cholecalciferol 5000 units CHEW Chew 10,000 Units by mouth daily.  Marland Kitchen losartan (COZAAR) 100 MG tablet Take 1 whole tablet Daily for BP  . minoxidil (LONITEN) 10 MG tablet Take 1/2 to 1 tablet Daily for BP  . NITROSTAT 0.4 MG SL tablet as needed for chest pain.  . pantoprazole (PROTONIX) 40 MG tablet Take 1 tablet Daily for Acid Indigestion & Reflux    No Known Allergies  Past Surgical History:  Procedure Laterality Date  . BACK SURGERY    . CARDIAC CATHETERIZATION N/A 08/11/2015   Procedure: Left Heart Cath and Coronary Angiography;  Surgeon: Jerline Pain, MD;  Location: [place] [facility];  Service: Cardiovascular;  Laterality: N/A;  . CARDIAC CATHETERIZATION N/A 08/11/2015   Procedure: Coronary Stent Intervention;  Surgeon: Jerline Pain, MD;  Location: [place] [facility];  Service: Cardiovascular;  Laterality: N/A;  . CARDIAC CATHETERIZATION N/A 08/11/2015   Procedure: Coronary Stent Intervention;  Surgeon: Jettie Booze, MD;  Location: [place] [facility];  Service: Cardiovascular;  Laterality: N/A;  . LUMBAR DISC SURGERY     10 point systems review negative except as above.    Objective:   Physical Exam  BP 130/84   Pulse 60   Temp 97.7 F (36.5 C)   Resp 16   Ht 6\' 3"  (1.905 m)   Wt 197 lb 12.8 oz (89.7 kg)   BMI 24.72 kg/m   HEENT - WNL. Neck - supple.  Chest - Clear equal BS. Cor - Nl HS. RRR w/o sig MGR. PP 1(+). No edema. Abd - Soft, sl EG tenderness w/o G or RB & BS normal.  MS- FROM w/o deformities.  Gait Nl. Neuro -  Nl w/o focal abnormalities.    Assessment & Plan   1. Chest pain, unspecified type  - Ambulatory referral to Cardiology - EKG 12-Lead - NSR - No acute changes  2. Exertional dyspnea  - Ambulatory referral to Cardiology to consider stress Cardiolite  - EKG 12-Lead  3. Pyrosis  4. Gastroesophageal reflux disease- advised to change timing of his Protonix to take at Suppertime to help with nighttime pyrosis  5. Testosterone deficiency  - testosterone cypionate (DEPO-TESTOSTERONE) 200 MG/ML injection; Inject 1 (ONE) ml into muscle every  week or as directed  Dispense: 10 mL; Refill: 2

## 2018-11-11 ENCOUNTER — Encounter: Payer: Self-pay | Admitting: Cardiology

## 2018-11-11 ENCOUNTER — Ambulatory Visit: Payer: 59 | Admitting: Cardiology

## 2018-11-11 ENCOUNTER — Telehealth (HOSPITAL_COMMUNITY): Payer: Self-pay | Admitting: *Deleted

## 2018-11-11 ENCOUNTER — Other Ambulatory Visit: Payer: Self-pay

## 2018-11-11 ENCOUNTER — Encounter: Payer: Self-pay | Admitting: *Deleted

## 2018-11-11 VITALS — BP 140/80 | HR 64 | Ht 75.0 in | Wt 193.0 lb

## 2018-11-11 DIAGNOSIS — R0600 Dyspnea, unspecified: Secondary | ICD-10-CM

## 2018-11-11 DIAGNOSIS — I2510 Atherosclerotic heart disease of native coronary artery without angina pectoris: Secondary | ICD-10-CM | POA: Diagnosis not present

## 2018-11-11 DIAGNOSIS — I2583 Coronary atherosclerosis due to lipid rich plaque: Secondary | ICD-10-CM

## 2018-11-11 DIAGNOSIS — Z720 Tobacco use: Secondary | ICD-10-CM

## 2018-11-11 DIAGNOSIS — I209 Angina pectoris, unspecified: Secondary | ICD-10-CM | POA: Diagnosis not present

## 2018-11-11 DIAGNOSIS — R0609 Other forms of dyspnea: Secondary | ICD-10-CM | POA: Diagnosis not present

## 2018-11-11 NOTE — Progress Notes (Signed)
Cardiology Office Note:    Date:  11/11/2018   ID:  Corey Collier, DOB 1961-01-05, MRN [id number]  PCP:  Corey Pinto, MD  Cardiologist:  No primary care provider on file.  Electrophysiologist:  None   Referring MD: Corey Pinto, MD   Here for the evaluation of chest pain at the request of Corey Collier  History of Present Illness:    Corey Collier is a 58 y.o. male with a hx of coronary artery disease post mid LAD 90% lesion stented in May 2017 with excellent LDL 29.  Mother has a positive history of stroke.  He was a smoker.  Previously he was noting mid chest discomfort associated with shortness of breath when mowing the lawn.  Had to stop and rest.  No diaphoresis.  He previously had to cut back on atenolol because his heart rate was 50 and he felt tired.  He went to go see Corey Collier 11/03/2018 and was experiencing some chest pain and exertional dyspnea.  Felt dizzy 2 weeks ago at work 200/125. Went home. Saw Corey Collier. Started Minoxidil to lower BP. Worked, but made him feel bad, nausea. Chest was hurting. Called back and got off it. BP went back down. Felt better. Testoserone was a little low, got replacement tx.   CP is a bit similar to prior stent. But a week now no CP. More tired.   Past Medical History:  Diagnosis Date  . Coronary artery disease    a. Cath ~ 25 yrs ago, dx w/ pleurisy;  b. 07/2015 Cath/PCI: LM nl, LAD 12m (2.75x24 Synergy DES), LCX nl, RCA nl.  Marland Kitchen GERD (gastroesophageal reflux disease)   . Hypertensive heart disease   . Tobacco abuse     Past Surgical History:  Procedure Laterality Date  . BACK SURGERY    . CARDIAC CATHETERIZATION N/A 08/11/2015   Procedure: Left Heart Cath and Coronary Angiography;  Surgeon: Jerline Pain, MD;  Location: [place] [facility];  Service: Cardiovascular;  Laterality: N/A;  . CARDIAC CATHETERIZATION N/A 08/11/2015   Procedure: Coronary Stent Intervention;  Surgeon: Jerline Pain, MD;  Location: [place]  [facility];  Service: Cardiovascular;  Laterality: N/A;  . CARDIAC CATHETERIZATION N/A 08/11/2015   Procedure: Coronary Stent Intervention;  Surgeon: Jettie Booze, MD;  Location: [place] [facility];  Service: Cardiovascular;  Laterality: N/A;  . LUMBAR DISC SURGERY      Current Medications: Current Meds  Medication Sig  . aspirin EC 81 MG tablet Take 1 tablet (81 mg total) by mouth daily.  Marland Kitchen atenolol (TENORMIN) 25 MG tablet Take 1 tablet Daily for BP  . atorvastatin (LIPITOR) 40 MG tablet Take 1 tablet Daily for Cholesterol  . Cholecalciferol 5000 units CHEW Chew 10,000 Units by mouth daily.  Marland Kitchen losartan (COZAAR) 100 MG tablet Take 1 whole tablet Daily for BP  . nitroGLYCERIN (NITROSTAT) 0.4 MG SL tablet Place 1 tablet (0.4 mg total) under the tongue every 5 (five) minutes as needed for chest pain.  . pantoprazole (PROTONIX) 40 MG tablet Take 1 tablet Daily for Acid Indigestion & Reflux  . testosterone cypionate (DEPO-TESTOSTERONE) 200 MG/ML injection Inject 1 (ONE) ml into muscle every  week or as directed     Allergies:   Patient has no known allergies.   Social History   Socioeconomic History  . Marital status: Legally Separated    Spouse name: Not on file  . Number of children: Not on file  . Years of education:  Not on file  . Highest education level: Not on file  Occupational History  . Not on file  Social Needs  . Financial resource strain: Not on file  . Food insecurity    Worry: Not on file    Inability: Not on file  . Transportation needs    Medical: Not on file    Non-medical: Not on file  Tobacco Use  . Smoking status: Current Every Day Smoker    Packs/day: 1.50    Years: 41.00    Pack years: 61.50    Types: Cigarettes    Start date: 52  . Smokeless tobacco: Never Used  Substance and Sexual Activity  . Alcohol use: Yes    Alcohol/week: 40.0 standard drinks    Types: 40 Shots of liquor per week    Comment: 08/11/2015 drinks 3-4 fifths of liquor a week   . Drug use: No  . Sexual activity: Not on file  Lifestyle  . Physical activity    Days per week: Not on file    Minutes per session: Not on file  . Stress: Not on file  Relationships  . Social Herbalist on phone: Not on file    Gets together: Not on file    Attends religious service: Not on file    Active member of club or organization: Not on file    Attends meetings of clubs or organizations: Not on file    Relationship status: Not on file  Other Topics Concern  . Not on file  Social History Narrative  . Not on file     Family History: The patient's family history includes Stroke in his father.  ROS:   Please see the history of present illness.    Denies any fevers chills nausea vomiting syncope all other systems reviewed and are negative.  EKGs/Labs/Other Studies Reviewed:    The following studies were reviewed today: Prior cardiac catheterization reviewed, LAD stent  EKG: 11/03/2018-sinus bradycardia 50 with nonspecific ST-T wave changes-12/06/2016 showed sinus rhythm 65 with no other abnormalities.  Recent Labs: 08/27/2018: ALT 43; BUN 17; Creat 0.99; Hemoglobin 15.1; Magnesium 2.1; Platelets 204; Potassium 4.3; Sodium 140; TSH 0.99  Recent Lipid Panel    Component Value Date/Time   CHOL 168 08/27/2018 1536   TRIG 161 (H) 08/27/2018 1536   HDL 84 08/27/2018 1536   CHOLHDL 2.0 08/27/2018 1536   VLDL 22 05/10/2016 1008   LDLCALC 60 08/27/2018 1536    Physical Exam:    VS:  BP 140/80   Pulse 64   Ht 6\' 3"  (1.905 m)   Wt 193 lb (87.5 kg)   SpO2 99%   BMI 24.12 kg/m     Wt Readings from Last 3 Encounters:  11/11/18 193 lb (87.5 kg)  11/03/18 197 lb 12.8 oz (89.7 kg)  09/17/18 199 lb 6.4 oz (90.4 kg)     GEN:  Well nourished, well developed in no acute distress HEENT: Normal NECK: No JVD; No carotid bruits LYMPHATICS: No lymphadenopathy CARDIAC: RRR, no murmurs, rubs, gallops RESPIRATORY:  Clear to auscultation without rales, wheezing or  rhonchi  ABDOMEN: Soft, non-tender, non-distended MUSCULOSKELETAL:  No edema; No deformity  SKIN: Warm and dry NEUROLOGIC:  Alert and oriented x 3 PSYCHIATRIC:  Normal affect   ASSESSMENT:    1. Coronary artery disease due to lipid rich plaque   2. Dyspnea on exertion    PLAN:    In order of problems listed above:  Anginal symptoms,  coronary artery disease -Had a mid LAD lesion 90% stenosed with a 2.75 Synergy drug-eluting stent 24 mm postdilated to 3.5.  Aspirin.  Had some bruising with Plavix minimal. -He has not had chest pain over the past week.  He wonders if it was associated perhaps with the minoxidil that was transient for his elevated blood pressure.  He is unsure.  Nonetheless, I agree with Corey Collier that we should proceed with a stress test, nuclear to make sure that there is no high risk ischemia.  If his LAD stent were to restenosed, I would be sure to see some high risk anteroseptal wall activity.  If this comes back normal, reassurance. -Obviously if chest discomfort were to become more worrisome, we could always proceed with diagnostic cardiac catheterization.  We discussed this today and he does not wish to proceed in the invasive direction unless he has to.  Tobacco use - Continue to encourage cessation.  Reduces risk for future heart attack  GERD -PPI, minimize alcohol.  Prior bradycardia -Improved with decreasing atenolol.  Hyperlipidemia - Atorvastatin 40.  Plaque stabilization.  Extremely low LDL.  Must keep him on a high intensity statin.  I will touch base with him in 6 months.  APP.  Medication Adjustments/Labs and Tests Ordered: Current medicines are reviewed at length with the patient today.  Concerns regarding medicines are outlined above.  Orders Placed This Encounter  Procedures  . MYOCARDIAL PERFUSION IMAGING   No orders of the defined types were placed in this encounter.   Patient Instructions  Medication Instructions:  The current  medical regimen is effective;  continue present plan and medications.  If you need a refill on your cardiac medications before your next appointment, please call your pharmacy.   Testing/Procedures: Your physician has requested that you have a lexiscan myoview. For further information please visit [email or web address]. Please follow instruction sheet, as given.  Follow-Up: At [facility], you and your health needs are our priority.  As part of our continuing mission to provide you with exceptional heart care, we have created designated Provider Care Teams.  These Care Teams include your primary Cardiologist (physician) and Advanced Practice Providers (APPs -  Physician Assistants and Nurse Practitioners) who all work together to provide you with the care you need, when you need it. You will need a follow up appointment in 6 months.  Please call our office 2 months in advance to schedule this appointment.  You may see Dr Candee Furbish or one of the following Advanced Practice Providers on your designated Care Team:   Truitt Merle, NP Cecilie Kicks, NP . Kathyrn Drown, NP  Thank you for choosing [facility]!!         Signed, Candee Furbish, MD  11/11/2018 10:13 AM    [place]

## 2018-11-11 NOTE — Patient Instructions (Signed)
Medication Instructions:  The current medical regimen is effective;  continue present plan and medications.  If you need a refill on your cardiac medications before your next appointment, please call your pharmacy.   Testing/Procedures: Your physician has requested that you have a lexiscan myoview. For further information please visit [email or web address]. Please follow instruction sheet, as given.  Follow-Up: At [facility], you and your health needs are our priority.  As part of our continuing mission to provide you with exceptional heart care, we have created designated Provider Care Teams.  These Care Teams include your primary Cardiologist (physician) and Advanced Practice Providers (APPs -  Physician Assistants and Nurse Practitioners) who all work together to provide you with the care you need, when you need it. You will need a follow up appointment in 6 months.  Please call our office 2 months in advance to schedule this appointment.  You may see Dr Candee Furbish or one of the following Advanced Practice Providers on your designated Care Team:   Truitt Merle, NP Cecilie Kicks, NP . Kathyrn Drown, NP  Thank you for choosing [facility]!!

## 2018-11-11 NOTE — Telephone Encounter (Signed)
Patient given detailed instructions per Myocardial Perfusion Study Information Sheet for the test on 11/18/2018 at 0800. Patient notified to arrive 15 minutes early and that it is imperative to arrive on time for appointment to keep from having the test rescheduled.  If you need to cancel or reschedule your appointment, please call the office within 24 hours of your appointment. . Patient verbalized understanding.Corey Collier, [name] Patient does not have my chart

## 2018-11-18 ENCOUNTER — Ambulatory Visit (HOSPITAL_COMMUNITY): Payer: 59 | Attending: Cardiovascular Disease

## 2018-11-18 ENCOUNTER — Other Ambulatory Visit: Payer: Self-pay

## 2018-11-18 DIAGNOSIS — I2510 Atherosclerotic heart disease of native coronary artery without angina pectoris: Secondary | ICD-10-CM | POA: Diagnosis present

## 2018-11-18 DIAGNOSIS — I2583 Coronary atherosclerosis due to lipid rich plaque: Secondary | ICD-10-CM | POA: Diagnosis present

## 2018-11-18 DIAGNOSIS — R0609 Other forms of dyspnea: Secondary | ICD-10-CM | POA: Diagnosis not present

## 2018-11-18 DIAGNOSIS — R0600 Dyspnea, unspecified: Secondary | ICD-10-CM

## 2018-11-18 LAB — MYOCARDIAL PERFUSION IMAGING
LV dias vol: 96 mL (ref 62–150)
LV sys vol: 26 mL
Peak HR: 79 {beats}/min
Rest HR: 49 {beats}/min
SDS: 0
SRS: 0
SSS: 0
TID: 0.99

## 2018-11-18 MED ORDER — REGADENOSON 0.4 MG/5ML IV SOLN
0.4000 mg | Freq: Once | INTRAVENOUS | Status: AC
  Administered 2018-11-18: 0.4 mg via INTRAVENOUS

## 2018-11-18 MED ORDER — TECHNETIUM TC 99M TETROFOSMIN IV KIT
10.7000 | PACK | Freq: Once | INTRAVENOUS | Status: AC | PRN
  Administered 2018-11-18: 10.7 via INTRAVENOUS
  Filled 2018-11-18: qty 11

## 2018-11-18 MED ORDER — TECHNETIUM TC 99M TETROFOSMIN IV KIT
31.6000 | PACK | Freq: Once | INTRAVENOUS | Status: AC | PRN
  Administered 2018-11-18: 31.6 via INTRAVENOUS
  Filled 2018-11-18: qty 32

## 2018-12-03 NOTE — Progress Notes (Deleted)
FOLLOW UP  Assessment and Plan:   CAD Control blood pressure, cholesterol, glucose, increase exercise low risk stress myoview in 10/2018 Stop smoking Followed by cardiology  Hypertension Continue medications; at goal  Monitor blood pressure at home; patient to call if consistently greater than 130/80 Continue DASH diet.   Reminder to go to the ER if any CP, SOB, nausea, dizziness, severe HA, changes vision/speech, left arm numbness and tingling and jaw pain.  Cholesterol At goal; continue medication Continue low cholesterol diet and exercise.  Check lipid panel.   Tobacco use Discussed risks associated with tobacco use and advised to reduce or quit Patient is not ready to do so, but advised to consider strongly Will follow up at the next visit Monitor by imaging annually at CPE, Low dose CT discussed today, info provided, patient is interested and will consider ***  GERD Symptoms well managed without breakthrough Failed trial to taper off of PPI Cut back on ETOH  Vitamin D Def He has increased supplement and taking 10000 IU daily; near goal at last check Defer vit D  Hypogonadism continue replacement therapy, check testosterone levels as needed.   BPH with LUTS ***  Continue diet and meds as discussed. Further disposition pending results of labs. Discussed med's effects and SE's.   Over 30 minutes of exam, counseling, chart review, and critical decision making was performed.   Future Appointments  Date Time Provider [place]  12/04/2018  8:45 AM [name], NP [facility] None  03/09/2019  9:30 AM Unk Pinto, MD [facility] None  09/09/2019 10:00 AM Unk Pinto, MD [facility] None  09/14/2019  3:00 PM Unk Pinto, MD [facility] None    ----------------------------------------------------------------------------------------------------------------------  HPI 58 y.o. male  presents for 3 month follow up on hypertension, continued tobacco  use and cholesterol;   he currently continues to smoke 1.5 pack a day, for 41 years ; discussed risks associated with smoking, patient is not ready to quit.  No recent imaging *** Low dose CT scan discussed, information provided to review last time ***  He admits to drinking 5-8 drinks "shots of hard liquor" - has cut back, not ready to decrease further at this time. Risks discussed and encouraged to consider strongly.   he has a diagnosis of GERD which is currently managed by protonix 40 mg daily, failed attempt to taper with H2i (high dose ranitidine)  he reports symptoms is currently well controlled, and denies breakthrough reflux, burning in chest, hoarseness or cough.    BMI is There is no height or weight on file to calculate BMI., he has not been working on diet and exercise. Wt Readings from Last 3 Encounters:  11/18/18 193 lb (87.5 kg)  11/11/18 193 lb (87.5 kg)  11/03/18 197 lb 12.8 oz (89.7 kg)   He is s/p mid LAD 90% lesion stented in mid May 2017, followed by Dr. Marlou Porch.  Recently reported episodes of angina and underwent stress myoview which showed low risk study His blood pressure has not been controlled at home, today their BP is    He does workout. He denies chest pain, shortness of breath, dizziness.   He is on cholesterol medication (atorvastatin 20 mg daily) and denies myalgias. His cholesterol is at goal. The cholesterol last visit was:   Lab Results  Component Value Date   CHOL 168 08/27/2018   HDL 84 08/27/2018   LDLCALC 60 08/27/2018   TRIG 161 (H) 08/27/2018   CHOLHDL 2.0 08/27/2018   Last A1C in  the office was:  Lab Results  Component Value Date   HGBA1C 5.3 08/27/2018   Patient is on Vitamin D supplement, now taking 10000 IU daily  Lab Results  Component Value Date   VD25OH 66 08/27/2018     He has a history of testosterone deficiency and is on testosterone replacement. *** He states that the testosterone helps with his energy, libido, muscle  mass. Lab Results  Component Value Date   TESTOSTERONE 253 08/27/2018   He has dx of BPH with LUTS *** no meds?  Lab Results  Component Value Date   PSA 0.6 08/27/2018   PSA 0.5 05/28/2017   PSA 1.6 01/25/2016     Current Medications:  Current Outpatient Medications on File Prior to Visit  Medication Sig  . aspirin EC 81 MG tablet Take 1 tablet (81 mg total) by mouth daily.  Marland Kitchen atenolol (TENORMIN) 25 MG tablet Take 1 tablet Daily for BP  . atorvastatin (LIPITOR) 40 MG tablet Take 1 tablet Daily for Cholesterol  . Cholecalciferol 5000 units CHEW Chew 10,000 Units by mouth daily.  Marland Kitchen losartan (COZAAR) 100 MG tablet Take 1 whole tablet Daily for BP  . nitroGLYCERIN (NITROSTAT) 0.4 MG SL tablet Place 1 tablet (0.4 mg total) under the tongue every 5 (five) minutes as needed for chest pain.  . pantoprazole (PROTONIX) 40 MG tablet Take 1 tablet Daily for Acid Indigestion & Reflux  . testosterone cypionate (DEPO-TESTOSTERONE) 200 MG/ML injection Inject 1 (ONE) ml into muscle every  week or as directed   No current facility-administered medications on file prior to visit.      Allergies: No Known Allergies   Medical History:  Past Medical History:  Diagnosis Date  . Coronary artery disease    a. Cath ~ 25 yrs ago, dx w/ pleurisy;  b. 07/2015 Cath/PCI: LM nl, LAD 48m (2.75x24 Synergy DES), LCX nl, RCA nl.  Marland Kitchen GERD (gastroesophageal reflux disease)   . Hypertensive heart disease   . Tobacco abuse    Family history- Reviewed and unchanged Social history- Reviewed and unchanged   Review of Systems:  Review of Systems  Constitutional: Negative for malaise/fatigue and weight loss.  HENT: Negative for hearing loss and tinnitus.   Eyes: Negative for blurred vision and double vision.  Respiratory: Negative for cough, shortness of breath and wheezing.   Cardiovascular: Negative for chest pain, palpitations, orthopnea, claudication and leg swelling.  Gastrointestinal: Negative for  abdominal pain, blood in stool, constipation, diarrhea, heartburn, melena, nausea and vomiting.  Genitourinary: Negative.   Musculoskeletal: Negative for joint pain and myalgias.  Skin: Negative for rash.  Neurological: Negative for dizziness, tingling, sensory change, weakness and headaches.  Endo/Heme/Allergies: Negative for polydipsia.  Psychiatric/Behavioral: Negative.   All other systems reviewed and are negative.   Physical Exam: There were no vitals taken for this visit. Wt Readings from Last 3 Encounters:  11/18/18 193 lb (87.5 kg)  11/11/18 193 lb (87.5 kg)  11/03/18 197 lb 12.8 oz (89.7 kg)   General Appearance: Well nourished, in no apparent distress. Eyes: PERRLA, EOMs, conjunctiva no swelling or erythema Sinuses: No Frontal/maxillary tenderness ENT/Mouth: Ext aud canals clear, TMs without erythema, bulging. No erythema, swelling, or exudate on post pharynx.  Tonsils not swollen or erythematous. Hearing normal.  Neck: Supple, thyroid normal.  Respiratory: Respiratory effort normal, BS equal bilaterally without rales, rhonchi, wheezing or stridor.  Cardio: RRR with no MRGs. Brisk peripheral pulses without edema.  Abdomen: Soft, + BS.  Non tender, no  guarding, rebound, hernias, masses. Lymphatics: Non tender without lymphadenopathy.  Musculoskeletal: Full ROM, 5/5 strength, Normal gait Skin: Warm, dry without rashes, lesions, ecchymosis.  Neuro: Cranial nerves intact. No cerebellar symptoms.  Psych: Awake and oriented X 3, normal affect, Insight and Judgment appropriate.    Izora Ribas, NP 8:37 AM [facility]

## 2018-12-04 ENCOUNTER — Ambulatory Visit: Payer: 59 | Admitting: Adult Health

## 2018-12-04 DIAGNOSIS — I10 Essential (primary) hypertension: Secondary | ICD-10-CM

## 2018-12-16 NOTE — Progress Notes (Signed)
FOLLOW UP  Assessment and Plan:   CAD Control blood pressure, cholesterol, glucose, increase exercise low risk stress myoview in 10/2018 Stop smoking Followed by cardiology  Hypertension Continue medications; recheck today fairly controlled Advised to avoid smoking right prior to OV Monitor blood pressure at home; patient to call if consistently greater than 130/80 Continue DASH diet.   Reminder to go to the ER if any CP, SOB, nausea, dizziness, severe HA, changes vision/speech, left arm numbness and tingling and jaw pain.  Cholesterol At goal; continue medication Continue low cholesterol diet and exercise.  Check lipid panel.   Tobacco use Discussed risks associated with tobacco use and advised to reduce or quit Patient is ready to do so and plans to quit 01/25/2019 Prescription for chantix with information provided Will follow up at the next visit Monitor by imaging annually at CPE, Low dose CT discussed today, info provided, patient is interested and will consider,   GERD Symptoms well managed without breakthrough Failed trial to taper off of PPI Cut back on ETOH  Vitamin D Def He has increased supplement and taking 10000 IU daily; near goal at last check Defer vit D  Hypogonadism After discussion today he declines to initiate testosterone injections due to needle size He would prefer to work on lifestyle; fatigue has improved some Suggested initiate zinc 50 mg daily, HIIT exercises  Continue diet and meds as discussed. Further disposition pending results of labs. Discussed med's effects and SE's.   Over 30 minutes of exam, counseling, chart review, and critical decision making was performed.   Future Appointments  Date Time Provider [place]  03/09/2019  9:30 AM Unk Pinto, MD [facility] None  09/09/2019 10:00 AM Unk Pinto, MD [facility] None  09/14/2019  3:00 PM Unk Pinto, MD [facility] None     ----------------------------------------------------------------------------------------------------------------------  HPI 58 y.o. male  presents for 3 month follow up on hypertension, continued tobacco use and cholesterol;   he currently continues to smoke 1.5 pack a day, for 40+ years ; discussed risks associated with smoking, patient is not ready to quit.  No recent imaging, Low dose CT scan discussed, information provided to review last time, declines any at this time.   He admits to drinking 5-8 drinks "shots of hard liquor" - has cut back, not ready to decrease further at this time. Risks discussed and encouraged to consider strongly.   he has a diagnosis of GERD which is currently managed by protonix 40 mg daily, failed attempt to taper with H2i (high dose ranitidine)  he reports symptoms is currently well controlled, and denies breakthrough reflux, burning in chest, hoarseness or cough.    BMI is Body mass index is 24.62 kg/m., he has not been working on diet and exercise. Wt Readings from Last 3 Encounters:  12/17/18 197 lb (89.4 kg)  11/18/18 193 lb (87.5 kg)  11/11/18 193 lb (87.5 kg)   He is s/p mid LAD 90% lesion stented in mid May 2017, followed by Dr. Marlou Porch.  Recently reported episodes of angina, and underwent stress myoview which showed low risk study; patient reports he has not had episodes since  His blood pressure has not been controlled at home, today their BP is BP: 138/84  He does workout. He denies chest pain, shortness of breath, dizziness.   He is on cholesterol medication (atorvastatin 20 mg daily) and denies myalgias. His cholesterol is at goal. The cholesterol last visit was:   Lab Results  Component Value Date   CHOL  168 08/27/2018   HDL 84 08/27/2018   LDLCALC 60 08/27/2018   TRIG 161 (H) 08/27/2018   CHOLHDL 2.0 08/27/2018   Last A1C in the office was:  Lab Results  Component Value Date   HGBA1C 5.3 08/27/2018   Patient is on Vitamin D  supplement, now taking 10000 IU daily  Lab Results  Component Value Date   VD25OH 66 08/27/2018     He has a history of testosterone deficiency, does endorse some low energy. Exercising less than he used to. Discussed teststerone injections and was supposed to start today but now states he no longer wants to do this. He has not tried zinc supplementation. He states that the testosterone helps with his energy, libido, muscle mass. Lab Results  Component Value Date   TESTOSTERONE 253 08/27/2018    Current Medications:  Current Outpatient Medications on File Prior to Visit  Medication Sig  . aspirin EC 81 MG tablet Take 1 tablet (81 mg total) by mouth daily.  Marland Kitchen atenolol (TENORMIN) 25 MG tablet Take 1 tablet Daily for BP  . atorvastatin (LIPITOR) 40 MG tablet Take 1 tablet Daily for Cholesterol  . Cholecalciferol 5000 units CHEW Chew 10,000 Units by mouth daily.  Marland Kitchen losartan (COZAAR) 100 MG tablet Take 1 whole tablet Daily for BP  . nitroGLYCERIN (NITROSTAT) 0.4 MG SL tablet Place 1 tablet (0.4 mg total) under the tongue every 5 (five) minutes as needed for chest pain.  . pantoprazole (PROTONIX) 40 MG tablet Take 1 tablet Daily for Acid Indigestion & Reflux  . testosterone cypionate (DEPO-TESTOSTERONE) 200 MG/ML injection Inject 1 (ONE) ml into muscle every  week or as directed (Patient not taking: Reported on 12/17/2018)   No current facility-administered medications on file prior to visit.      Allergies: No Known Allergies   Medical History:  Past Medical History:  Diagnosis Date  . Coronary artery disease    a. Cath ~ 25 yrs ago, dx w/ pleurisy;  b. 07/2015 Cath/PCI: LM nl, LAD 31m (2.75x24 Synergy DES), LCX nl, RCA nl.  Marland Kitchen GERD (gastroesophageal reflux disease)   . Hypertensive heart disease   . Tobacco abuse    Family history- Reviewed and unchanged Social history- Reviewed and unchanged   Review of Systems:  Review of Systems  Constitutional: Negative for malaise/fatigue  and weight loss.  HENT: Negative for hearing loss and tinnitus.   Eyes: Negative for blurred vision and double vision.  Respiratory: Negative for cough, shortness of breath and wheezing.   Cardiovascular: Negative for chest pain, palpitations, orthopnea, claudication and leg swelling.  Gastrointestinal: Negative for abdominal pain, blood in stool, constipation, diarrhea, heartburn, melena, nausea and vomiting.  Genitourinary: Negative.   Musculoskeletal: Negative for joint pain and myalgias.  Skin: Negative for rash.  Neurological: Negative for dizziness, tingling, sensory change, weakness and headaches.  Endo/Heme/Allergies: Negative for polydipsia.  Psychiatric/Behavioral: Negative.   All other systems reviewed and are negative.   Physical Exam: BP 138/84   Pulse (!) 53   Temp (!) 97.3 F (36.3 C)   Ht 6\' 3"  (1.905 m)   Wt 197 lb (89.4 kg)   SpO2 98%   BMI 24.62 kg/m  Wt Readings from Last 3 Encounters:  12/17/18 197 lb (89.4 kg)  11/18/18 193 lb (87.5 kg)  11/11/18 193 lb (87.5 kg)   General Appearance: Well nourished, in no apparent distress. Eyes: PERRLA, EOMs, conjunctiva no swelling or erythema Sinuses: No Frontal/maxillary tenderness ENT/Mouth: Ext aud canals clear, TMs  without erythema, bulging. No erythema, swelling, or exudate on post pharynx.  Tonsils not swollen or erythematous. Hearing normal.  Neck: Supple, thyroid normal.  Respiratory: Respiratory effort normal, BS equal bilaterally without rales, rhonchi, wheezing or stridor.  Cardio: RRR with no MRGs. Brisk peripheral pulses without edema.  Abdomen: Soft, + BS.  Non tender, no guarding, rebound, hernias, masses. Lymphatics: Non tender without lymphadenopathy.  Musculoskeletal: Full ROM, 5/5 strength, Normal gait Skin: Warm, dry without rashes, lesions, ecchymosis.  Neuro: Cranial nerves intact. No cerebellar symptoms.  Psych: Awake and oriented X 3, normal affect, Insight and Judgment appropriate.     Izora Ribas, NP 12:00 PM [facility]

## 2018-12-17 ENCOUNTER — Encounter: Payer: Self-pay | Admitting: Adult Health

## 2018-12-17 ENCOUNTER — Other Ambulatory Visit: Payer: Self-pay

## 2018-12-17 ENCOUNTER — Ambulatory Visit: Payer: 59 | Admitting: Adult Health

## 2018-12-17 VITALS — BP 138/84 | HR 53 | Temp 97.3°F | Ht 75.0 in | Wt 197.0 lb

## 2018-12-17 DIAGNOSIS — E782 Mixed hyperlipidemia: Secondary | ICD-10-CM

## 2018-12-17 DIAGNOSIS — I119 Hypertensive heart disease without heart failure: Secondary | ICD-10-CM

## 2018-12-17 DIAGNOSIS — Z720 Tobacco use: Secondary | ICD-10-CM | POA: Diagnosis not present

## 2018-12-17 DIAGNOSIS — I10 Essential (primary) hypertension: Secondary | ICD-10-CM

## 2018-12-17 DIAGNOSIS — R7309 Other abnormal glucose: Secondary | ICD-10-CM

## 2018-12-17 DIAGNOSIS — Z87891 Personal history of nicotine dependence: Secondary | ICD-10-CM

## 2018-12-17 DIAGNOSIS — Z716 Tobacco abuse counseling: Secondary | ICD-10-CM | POA: Diagnosis not present

## 2018-12-17 DIAGNOSIS — K219 Gastro-esophageal reflux disease without esophagitis: Secondary | ICD-10-CM | POA: Diagnosis not present

## 2018-12-17 DIAGNOSIS — I2510 Atherosclerotic heart disease of native coronary artery without angina pectoris: Secondary | ICD-10-CM | POA: Diagnosis not present

## 2018-12-17 DIAGNOSIS — E559 Vitamin D deficiency, unspecified: Secondary | ICD-10-CM

## 2018-12-17 MED ORDER — VARENICLINE TARTRATE 1 MG PO TABS: 1.0000 mg | ORAL_TABLET | Freq: Two times a day (BID) | ORAL | 2 refills | Status: DC

## 2018-12-17 NOTE — Patient Instructions (Addendum)
Goals    . Blood Pressure < 130/80    . Quit Smoking     Quit date 01/25/2019 - start taking chantix 1/2 tab once daily 1 week prior (01/18/2019)    . Reduce alcohol intake     Gradually cut back to <2 drinks per day       Get on zinc 50 mg daily - chelated absorbs best    [place]  American cancer society  GE:610463 for more information or for a free program for smoking cessation help.   You can call QUIT SMART 1-800-QUIT-NOW for free nicotine patches or replacement therapy- if they are out- keep calling  [facility] Can call for smoking cessation classes, [phone]  If you have a smart phone, please look up Smoke Free app, this will help you stay on track and give you information about money you have saved, life that you have gained back and a ton of more information.   We are giving you chantix for smoking cessation. You can do it! And we are here to help! You may have heard some scary side effects about chantix, the three most common I hear about are nausea, crazy dreams and depression.  However, I like for my patients to try to stay on 1/2 a tablet twice a day rather than one tablet twice a day as normally prescribed. This helps decrease the chances of side effects and helps save money by making a one month prescription last two months  Please start the prescription this way:  Start 1/2 tablet by mouth once daily after food with a full glass of water for 3 days Then do 1/2 tablet by mouth twice daily for 4 days. During this first week you can smoke, but try to stop after this week.  At this point we have several options: 1) continue on 1/2 tablet twice a day- which I encourage you to do. You can stay on this dose the rest of the time on the medication or if you still feel the need to smoke you can do one of the two options below. 2) do one tablet in the morning and 1/2 in the evening which helps decrease dreams. 3) do one tablet  twice a day.   What if I miss a dose? If you miss a dose, take it as soon as you can. If it is almost time for your next dose, take only that dose. Do not take double or extra doses.  What should I watch for while using this medicine? Visit your doctor or health care professional for regular check ups. Ask for ongoing advice and encouragement from your doctor or healthcare professional, friends, and family to help you quit. If you smoke while on this medication, quit again  Your mouth may get dry. Chewing sugarless gum or hard candy, and drinking plenty of water may help. Contact your doctor if the problem does not go away or is severe.  You may get drowsy or dizzy. Do not drive, use machinery, or do anything that needs mental alertness until you know how this medicine affects you. Do not stand or sit up quickly, especially if you are an older patient.   The use of this medicine may increase the chance of suicidal thoughts or actions. Pay special attention to how you are responding while on this medicine. Any worsening of mood, or thoughts of suicide or dying should be reported to your health care professional right away.  ADVANTAGES OF QUITTING SMOKING  Within 20 minutes, blood pressure decreases. Your pulse is at normal level.  After 8 hours, carbon monoxide levels in the blood return to normal. Your oxygen level increases.  After 24 hours, the chance of having a heart attack starts to decrease. Your breath, hair, and body stop smelling like smoke.  After 48 hours, damaged nerve endings begin to recover. Your sense of taste and smell improve.  After 72 hours, the body is virtually free of nicotine. Your bronchial tubes relax and breathing becomes easier.  After 2 to 12 weeks, lungs can hold more air. Exercise becomes easier and circulation improves.  After 1 year, the risk of coronary heart disease is cut in half.  After 5 years, the risk of stroke falls to the same as a  nonsmoker.  After 10 years, the risk of lung cancer is cut in half and the risk of other cancers decreases significantly.  After 15 years, the risk of coronary heart disease drops, usually to the level of a nonsmoker.  You will have extra money to spend on things other than cigarettes.    Can do zinc 40-50 mg a day Can try shake that is whey protein, almond milk, avocado oil, and spinach and strawberries in the morning  9 Ways to Naturally Increase Testosterone Levels  1.   Lose Weight If you're overweight, shedding the excess pounds may increase your testosterone levels, according to research presented at the Endocrine Society's 2012 meeting. Overweight men are more likely to have low testosterone levels to begin with, so this is an important trick to increase your body's testosterone production when you need it most.  2.   High-Intensity Exercise like Peak Fitness  Short intense exercise has a proven positive effect on increasing testosterone levels and preventing its decline. That's unlike aerobics or prolonged moderate exercise, which have shown to have negative or no effect on testosterone levels. Having a whey protein meal after exercise can further enhance the satiety/testosterone-boosting impact (hunger hormones cause the opposite effect on your testosterone and libido). Here's a summary of what a typical high-intensity Peak Fitness routine might look like: " Warm up for three minutes  " Exercise as hard and fast as you can for 30 seconds. You should feel like you couldn't possibly go on another few seconds  " Recover at a slow to moderate pace for 90 seconds  " Repeat the high intensity exercise and recovery 7 more times .  3.   Consume Plenty of Zinc The mineral zinc is important for testosterone production, and supplementing your diet for as little as six weeks has been shown to cause a marked improvement in testosterone among men with low levels.1 Likewise, research has shown  that restricting dietary sources of zinc leads to a significant decrease in testosterone, while zinc supplementation increases it2 -- and even protects men from exercised-induced reductions in testosterone levels.3 It's estimated that up to 19 percent of adults over the age of 60 may have lower than recommended zinc intakes; even when dietary supplements were added in, an estimated 20-25 percent of older adults still had inadequate zinc intakes, according to a [employer] and Nutrition Examination Survey.4 Your diet is the best source of zinc; along with protein-rich foods like meats and fish, other good dietary sources of zinc include raw milk, raw cheese, beans, and yogurt or kefir made from raw milk. It can be difficult to obtain enough dietary zinc if you're a vegetarian, and also  for meat-eaters as well, largely because of conventional farming methods that rely heavily on chemical fertilizers and pesticides. These chemicals deplete the soil of nutrients ... nutrients like zinc that must be absorbed by plants in order to be passed on to you. In many cases, you may further deplete the nutrients in your food by the way you prepare it. For most food, cooking it will drastically reduce its levels of nutrients like zinc ... particularly over-cooking, which many people do. If you decide to use a zinc supplement, stick to a dosage of less than 40 mg a day, as this is the recommended adult upper limit. Taking too much zinc can interfere with your body's ability to absorb other minerals, especially copper, and may cause nausea as a side effect.  4.   Strength Training In addition to Peak Fitness, strength training is also known to boost testosterone levels, provided you are doing so intensely enough. When strength training to boost testosterone, you'll want to increase the weight and lower your number of reps, and then focus on exercises that work a large number of muscles, such as dead lifts or squats.  You  can "turbo-charge" your weight training by going slower. By slowing down your movement, you're actually turning it into a high-intensity exercise. Super Slow movement allows your muscle, at the microscopic level, to access the maximum number of cross-bridges between the protein filaments that produce movement in the muscle.   5.   Optimize Your Vitamin D Levels Vitamin D, a steroid hormone, is essential for the healthy development of the nucleus of the sperm cell, and helps maintain semen quality and sperm count. Vitamin D also increases levels of testosterone, which may boost libido. In one study, overweight men who were given vitamin D supplements had a significant increase in testosterone levels after one year.5   6.   Reduce Stress When you're under a lot of stress, your body releases high levels of the stress hormone cortisol. This hormone actually blocks the effects of testosterone,6 presumably because, from a biological standpoint, testosterone-associated behaviors (mating, competing, aggression) may have lowered your chances of survival in an emergency (hence, the "fight or flight" response is dominant, courtesy of cortisol).  7.   Limit or Eliminate Sugar from Your Diet Testosterone levels decrease after you eat sugar, which is likely because the sugar leads to a high insulin level, another factor leading to low testosterone.7 Based on USDA estimates, the average American consumes 12 teaspoons of sugar a day, which equates to about TWO TONS of sugar during a lifetime.  8.   Eat Healthy Fats By healthy, this means not only mon- and polyunsaturated fats, like that found in avocadoes and nuts, but also saturated, as these are essential for building testosterone. Research shows that a diet with less than 40 percent of energy as fat (and that mainly from animal sources, i.e. saturated) lead to a decrease in testosterone levels.8 My personal diet is about 60-70 percent healthy fat, and other experts  agree that the ideal diet includes somewhere between 50-70 percent fat.  It's important to understand that your body requires saturated fats from animal and vegetable sources (such as meat, dairy, certain oils, and tropical plants like coconut) for optimal functioning, and if you neglect this important food group in favor of sugar, grains and other starchy carbs, your health and weight are almost guaranteed to suffer. Examples of healthy fats you can eat more of to give your testosterone levels a boost include:  Olives and Olive oil  Coconuts and coconut oil Butter made from raw grass-fed organic milk Raw nuts, such as, almonds or pecans Organic pastured egg yolks Avocados Grass-fed meats Palm oil Unheated organic nut oils   9.   Boost Your Intake of Branch Chain Amino Acids (BCAA) from Foods Like [place] suggests that BCAAs result in higher testosterone levels, particularly when taken along with resistance training.9 While BCAAs are available in supplement form, you'll find the highest concentrations of BCAAs like leucine in dairy products - especially quality cheeses and whey protein. Even when getting leucine from your natural food supply, it's often wasted or used as a building block instead of an anabolic agent. So to create the correct anabolic environment, you need to boost leucine consumption way beyond mere maintenance levels. That said, keep in mind that using leucine as a free form amino acid can be highly counterproductive as when free form amino acids are artificially administrated, they rapidly enter your circulation while disrupting insulin function, and impairing your body's glycemic control. Food-based leucine is really the ideal form that can benefit your muscles without side effects.

## 2018-12-18 LAB — COMPLETE METABOLIC PANEL WITH GFR
AG Ratio: 1.9 (calc) (ref 1.0–2.5)
ALT: 43 U/L (ref 9–46)
AST: 29 U/L (ref 10–35)
Albumin: 4.6 g/dL (ref 3.6–5.1)
Alkaline phosphatase (APISO): 74 U/L (ref 35–144)
BUN: 16 mg/dL (ref 7–25)
CO2: 30 mmol/L (ref 20–32)
Calcium: 9.5 mg/dL (ref 8.6–10.3)
Chloride: 103 mmol/L (ref 98–110)
Creat: 0.97 mg/dL (ref 0.70–1.33)
GFR, Est African American: 100 mL/min/{1.73_m2} (ref >=60)
GFR, Est Non African American: 86 mL/min/{1.73_m2} (ref >=60)
Globulin: 2.4 g/dL (calc) (ref 1.9–3.7)
Glucose, Bld: 93 mg/dL (ref 65–99)
Potassium: 4.3 mmol/L (ref 3.5–5.3)
Sodium: 140 mmol/L (ref 135–146)
Total Bilirubin: 0.5 mg/dL (ref 0.2–1.2)
Total Protein: 7 g/dL (ref 6.1–8.1)

## 2018-12-18 LAB — CBC WITH DIFFERENTIAL/PLATELET
Absolute Monocytes: 525 cells/uL (ref 200–950)
Basophils Absolute: 67 cells/uL (ref 0–200)
Basophils Relative: 0.9 %
Eosinophils Absolute: 192 cells/uL (ref 15–500)
Eosinophils Relative: 2.6 %
HCT: 44.4 % (ref 38.5–50.0)
Hemoglobin: 14.8 g/dL (ref 13.2–17.1)
Lymphs Abs: 2013 cells/uL (ref 850–3900)
MCH: 31.6 pg (ref 27.0–33.0)
MCHC: 33.3 g/dL (ref 32.0–36.0)
MCV: 94.7 fL (ref 80.0–100.0)
MPV: 11.6 fL (ref 7.5–12.5)
Monocytes Relative: 7.1 %
Neutro Abs: 4603 cells/uL (ref 1500–7800)
Neutrophils Relative %: 62.2 %
Platelets: 213 10*3/uL (ref 140–400)
RBC: 4.69 10*6/uL (ref 4.20–5.80)
RDW: 13.1 % (ref 11.0–15.0)
Total Lymphocyte: 27.2 %
WBC: 7.4 10*3/uL (ref 3.8–10.8)

## 2018-12-18 LAB — LIPID PANEL
Cholesterol: 137 mg/dL (ref <200)
HDL: 70 mg/dL (ref >=40)
LDL Cholesterol (Calc): 45 mg/dL (calc)
Non-HDL Cholesterol (Calc): 67 mg/dL (calc) (ref <130)
Total CHOL/HDL Ratio: 2 (calc) (ref <5.0)
Triglycerides: 141 mg/dL (ref <150)

## 2018-12-18 LAB — MAGNESIUM: Magnesium: 2.2 mg/dL (ref 1.5–2.5)

## 2018-12-18 LAB — TSH: TSH: 0.49 mIU/L (ref 0.40–4.50)

## 2019-03-08 ENCOUNTER — Encounter: Payer: Self-pay | Admitting: Internal Medicine

## 2019-03-08 NOTE — Progress Notes (Signed)
This note is not being shared with the patient for the following reason: To respect privacy (The patient or proxy has requested that the information not be shared).         History of Present Illness:      This very nice 58 y.o. DWM presents for 6 month follow up with HTN, HLD, Pre-Diabetes and Vitamin D Deficiency.  Patient's GERD is controlled on his meds.      Patient is treated for HTN (2017)  & BP has been controlled at home. Today's BP: 126/82. Patient has ASCAD and had DES of a mid 90% LAD in 2017 by Dr Marlou Porch. Stress Lexiscan in Aug was low risk.   Patient has had no complaints of any cardiac type chest pain, palpitations, dyspnea / orthopnea / PND, dizziness, claudication, or dependent edema. He continues to smoke 1.5 PPD.       Hyperlipidemia is controlled with diet & Atorvastatin. Patient denies myalgias or other med SE's. Last Lipids were at goal:  Lab Results  Component Value Date   CHOL 137 12/17/2018   HDL 70 12/17/2018   LDLCALC 45 12/17/2018   TRIG 141 12/17/2018   CHOLHDL 2.0 12/17/2018        Also, the patient is followed  Expectantly for glucose intolerance and has had no symptoms of reactive hypoglycemia, diabetic polys, paresthesias or visual blurring.  Last A1c was Normal & at goal:  Lab Results  Component Value Date   HGBA1C 5.3 08/27/2018        Further, the patient also has history of Vitamin D Deficiency ("36" / 2018)  and supplements vitamin D without any suspected side-effects. Last vitamin D was at goal:  Lab Results  Component Value Date   VD25OH 66 08/27/2018    Current Outpatient Medications on File Prior to Visit  Medication Sig  . aspirin EC 81 MG tablet Take 1 tablet (81 mg total) by mouth daily.  Marland Kitchen atenolol (TENORMIN) 25 MG tablet Take 1 tablet Daily for BP  . atorvastatin (LIPITOR) 40 MG tablet Take 1 tablet Daily for Cholesterol  . Cholecalciferol 5000 units CHEW Chew 10,000 Units by mouth daily.  Marland Kitchen losartan (COZAAR) 100 MG tablet  Take 1 whole tablet Daily for BP  . nitroGLYCERIN (NITROSTAT) 0.4 MG SL tablet Place 1 tablet (0.4 mg total) under the tongue every 5 (five) minutes as needed for chest pain.  . pantoprazole (PROTONIX) 40 MG tablet Take 1 tablet Daily for Acid Indigestion & Reflux   No current facility-administered medications on file prior to visit.    PMHx:   Past Medical History:  Diagnosis Date  . Coronary artery disease    a. Cath ~ 25 yrs ago, dx w/ pleurisy;  b. 07/2015 Cath/PCI: LM nl, LAD 39m (2.75x24 Synergy DES), LCX nl, RCA nl.  Marland Kitchen GERD (gastroesophageal reflux disease)   . Hypertensive heart disease   . Tobacco abuse    Immunization History  Administered Date(s) Administered  . PPD Test 05/28/2017, 08/27/2018  . Tdap 01/25/2016   Past Surgical History:  Procedure Laterality Date  . BACK SURGERY    . CARDIAC CATHETERIZATION N/A 08/11/2015   Procedure: Left Heart Cath and Coronary Angiography;  Surgeon: Jerline Pain, MD;  Location: [facility];  Service: Cardiovascular;  Laterality: N/A;  . CARDIAC CATHETERIZATION N/A 08/11/2015   Procedure: Coronary Stent Intervention;  Surgeon: Jerline Pain, MD;  Location: [facility];  Service: Cardiovascular;  Laterality: N/A;  . CARDIAC  CATHETERIZATION N/A 08/11/2015   Procedure: Coronary Stent Intervention;  Surgeon: Jettie Booze, MD;  Location: [facility];  Service: Cardiovascular;  Laterality: N/A;  . LUMBAR DISC SURGERY      FHx:    Reviewed / unchanged  SHx:    Reviewed / unchanged   Systems Review:  Constitutional: Denies fever, chills, wt changes, headaches, insomnia, fatigue, night sweats, change in appetite. Eyes: Denies redness, blurred vision, diplopia, discharge, itchy, watery eyes.  ENT: Denies discharge, congestion, post nasal drip, epistaxis, sore throat, earache, hearing loss, dental pain, tinnitus, vertigo, sinus pain, snoring.  CV: Denies chest pain, palpitations, irregular heartbeat, syncope,  dyspnea, diaphoresis, orthopnea, PND, claudication or edema. Respiratory: denies cough, dyspnea, DOE, pleurisy, hoarseness, laryngitis, wheezing.  Gastrointestinal: Denies dysphagia, odynophagia, heartburn, reflux, water brash, abdominal pain or cramps, nausea, vomiting, bloating, diarrhea, constipation, hematemesis, melena, hematochezia  or hemorrhoids. Genitourinary: Denies dysuria, frequency, urgency, nocturia, hesitancy, discharge, hematuria or flank pain. Musculoskeletal: Denies arthralgias, myalgias, stiffness, jt. swelling, pain, limping or strain/sprain.  Skin: Denies pruritus, rash, hives, warts, acne, eczema or change in skin lesion(s). Neuro: No weakness, tremor, incoordination, spasms, paresthesia or pain. Psychiatric: Denies confusion, memory loss or sensory loss. Endo: Denies change in weight, skin or hair change.  Heme/Lymph: No excessive bleeding, bruising or enlarged lymph nodes.  Physical Exam  BP 126/82   Pulse 60   Temp (!) 97.2 F (36.2 C)   Resp 16   Ht 6\' 3"  (1.905 m)   Wt 201 lb 3.2 oz (91.3 kg)   BMI 25.15 kg/m   Appears  well nourished, well groomed  and in no distress.  Eyes: PERRLA, EOMs, conjunctiva no swelling or erythema. Sinuses: No frontal/maxillary tenderness ENT/Mouth: EAC's clear, TM's nl w/o erythema, bulging. Nares clear w/o erythema, swelling, exudates. Oropharynx clear without erythema or exudates. Oral hygiene is good. Tongue normal, non obstructing. Hearing intact.  Neck: Supple. Thyroid not palpable. Car 2+/2+ without bruits, nodes or JVD. Chest: Respirations nl with BS clear & equal w/o rales, rhonchi, wheezing or stridor.  Cor: Heart sounds normal w/ regular rate and rhythm without sig. murmurs, gallops, clicks or rubs. Peripheral pulses normal and equal  without edema.  Abdomen: Soft & bowel sounds normal. Non-tender w/o guarding, rebound, hernias, masses or organomegaly.  Lymphatics: Unremarkable.  Musculoskeletal: Full ROM all  peripheral extremities, joint stability, 5/5 strength and normal gait.  Skin: Warm, dry without exposed rashes, lesions or ecchymosis apparent.  Neuro: Cranial nerves intact, reflexes equal bilaterally. Sensory-motor testing grossly intact. Tendon reflexes grossly intact.  Pysch: Alert & oriented x 3.  Insight and judgement nl & appropriate. No ideations.  Assessment and Plan:  1. Essential hypertension  - Continue medication, monitor blood pressure at home.  - Continue DASH diet.  Reminder to go to the ER if any CP,  SOB, nausea, dizziness, severe HA, changes vision/speech.  - CBC with Diff - COMPLETE METABOLIC PANEL WITH GFR - Magnesium - TSH  2.  - Continue diet/meds, exercise,& lifestyle modifications.  - Continue monitor periodic cholesterol/liver & renal functions  Hyperlipidemia, mixed  - Lipid Profile - TSH  3. Abnormal glucose  - Continue diet, exercise  - Lifestyle modifications.  - Monitor appropriate labs.  - Hemoglobin A1c ([facility]) - Insulin, random  4. Vitamin D deficiency  - Continue supplementation.  - Vitamin D (25 hydroxy)  5. Coronary artery disease involving native coronary artery  of native heart without angina pectoris  - Lipid Profile  6. Medication management  -  CBC with Diff - COMPLETE METABOLIC PANEL WITH GFR - Magnesium - Lipid Profile - TSH - Hemoglobin A1c ([facility]) - Insulin, random - Vitamin D (25 hydroxy)         Discussed  regular exercise, BP monitoring, weight control to achieve/maintain BMI less than 25 and discussed med and SE's. Recommended labs to assess and monitor clinical status with further disposition pending results of labs.  I discussed the assessment and treatment plan with the patient. The patient was provided an opportunity to ask questions and all were answered. The patient agreed with the plan and demonstrated an understanding of the instructions.  I provided over 30 minutes of exam, counseling, chart review  and  complex critical decision making.  Kirtland Bouchard, MD

## 2019-03-08 NOTE — Patient Instructions (Signed)

## 2019-03-09 ENCOUNTER — Other Ambulatory Visit: Payer: Self-pay

## 2019-03-09 ENCOUNTER — Ambulatory Visit: Payer: 59 | Admitting: Internal Medicine

## 2019-03-09 VITALS — BP 126/82 | HR 60 | Temp 97.2°F | Resp 16 | Ht 75.0 in | Wt 201.2 lb

## 2019-03-09 DIAGNOSIS — I10 Essential (primary) hypertension: Secondary | ICD-10-CM

## 2019-03-09 DIAGNOSIS — E782 Mixed hyperlipidemia: Secondary | ICD-10-CM | POA: Diagnosis not present

## 2019-03-09 DIAGNOSIS — R7309 Other abnormal glucose: Secondary | ICD-10-CM

## 2019-03-09 DIAGNOSIS — Z79899 Other long term (current) drug therapy: Secondary | ICD-10-CM

## 2019-03-09 DIAGNOSIS — I2510 Atherosclerotic heart disease of native coronary artery without angina pectoris: Secondary | ICD-10-CM

## 2019-03-09 DIAGNOSIS — E559 Vitamin D deficiency, unspecified: Secondary | ICD-10-CM

## 2019-03-10 ENCOUNTER — Other Ambulatory Visit: Payer: Self-pay | Admitting: Internal Medicine

## 2019-03-10 LAB — HEMOGLOBIN A1C
Hgb A1c MFr Bld: 5.4 % of total Hgb (ref <5.7)
Mean Plasma Glucose: 108 (calc)
eAG (mmol/L): 6 (calc)

## 2019-03-10 LAB — COMPLETE METABOLIC PANEL WITH GFR
AG Ratio: 1.7 (calc) (ref 1.0–2.5)
ALT: 29 U/L (ref 9–46)
AST: 20 U/L (ref 10–35)
Albumin: 4.2 g/dL (ref 3.6–5.1)
Alkaline phosphatase (APISO): 75 U/L (ref 35–144)
BUN: 15 mg/dL (ref 7–25)
CO2: 29 mmol/L (ref 20–32)
Calcium: 9.6 mg/dL (ref 8.6–10.3)
Chloride: 104 mmol/L (ref 98–110)
Creat: 1.03 mg/dL (ref 0.70–1.33)
GFR, Est African American: 93 mL/min/{1.73_m2} (ref >=60)
GFR, Est Non African American: 80 mL/min/{1.73_m2} (ref >=60)
Globulin: 2.5 g/dL (calc) (ref 1.9–3.7)
Glucose, Bld: 130 mg/dL — ABNORMAL HIGH (ref 65–99)
Potassium: 4.5 mmol/L (ref 3.5–5.3)
Sodium: 141 mmol/L (ref 135–146)
Total Bilirubin: 0.7 mg/dL (ref 0.2–1.2)
Total Protein: 6.7 g/dL (ref 6.1–8.1)

## 2019-03-10 LAB — CBC WITH DIFFERENTIAL/PLATELET
Absolute Monocytes: 409 cells/uL (ref 200–950)
Basophils Absolute: 62 cells/uL (ref 0–200)
Basophils Relative: 1 %
Eosinophils Absolute: 161 cells/uL (ref 15–500)
Eosinophils Relative: 2.6 %
HCT: 43.1 % (ref 38.5–50.0)
Hemoglobin: 14.7 g/dL (ref 13.2–17.1)
Lymphs Abs: 1792 cells/uL (ref 850–3900)
MCH: 32.5 pg (ref 27.0–33.0)
MCHC: 34.1 g/dL (ref 32.0–36.0)
MCV: 95.1 fL (ref 80.0–100.0)
MPV: 11.3 fL (ref 7.5–12.5)
Monocytes Relative: 6.6 %
Neutro Abs: 3776 cells/uL (ref 1500–7800)
Neutrophils Relative %: 60.9 %
Platelets: 186 10*3/uL (ref 140–400)
RBC: 4.53 10*6/uL (ref 4.20–5.80)
RDW: 12.7 % (ref 11.0–15.0)
Total Lymphocyte: 28.9 %
WBC: 6.2 10*3/uL (ref 3.8–10.8)

## 2019-03-10 LAB — LIPID PANEL
Cholesterol: 150 mg/dL (ref <200)
HDL: 81 mg/dL (ref >=40)
LDL Cholesterol (Calc): 48 mg/dL (calc)
Non-HDL Cholesterol (Calc): 69 mg/dL (calc) (ref <130)
Total CHOL/HDL Ratio: 1.9 (calc) (ref <5.0)
Triglycerides: 126 mg/dL (ref <150)

## 2019-03-10 LAB — INSULIN, RANDOM: Insulin: 30.5 u[IU]/mL — ABNORMAL HIGH

## 2019-03-10 LAB — VITAMIN D 25 HYDROXY (VIT D DEFICIENCY, FRACTURES): Vit D, 25-Hydroxy: 42 ng/mL (ref 30–100)

## 2019-03-10 LAB — TSH: TSH: 0.7 mIU/L (ref 0.40–4.50)

## 2019-03-10 LAB — MAGNESIUM: Magnesium: 2.1 mg/dL (ref 1.5–2.5)

## 2019-06-09 ENCOUNTER — Ambulatory Visit: Payer: 59 | Admitting: Adult Health Nurse Practitioner

## 2019-08-21 ENCOUNTER — Other Ambulatory Visit: Payer: Self-pay | Admitting: Internal Medicine

## 2019-08-21 DIAGNOSIS — K219 Gastro-esophageal reflux disease without esophagitis: Secondary | ICD-10-CM

## 2019-09-08 ENCOUNTER — Encounter: Payer: Self-pay | Admitting: Internal Medicine

## 2019-09-08 NOTE — Patient Instructions (Signed)

## 2019-09-08 NOTE — Progress Notes (Signed)
Annual  Screening/Preventative Visit  & Comprehensive Evaluation & Examination     This very nice 59 y.o.male presents for a Screening /Preventative Visit & comprehensive evaluation and management of multiple medical co-morbidities.  Patient has been followed for HTN, ASCAD,  HLD, Prediabetes and Vitamin D Deficiency. Patient's GERD is controlled on his meds.     Last June 2020, patient indicated willingness to have a LD screening Chest CT scan due to his 36 + pack year smoking hx, but never followed through on scheduling. Today he indicates a willingness to do the LD Screening Chest CT scan & also do a Cologard     HTN predates circa 2017 when he presented with an ACS and underwent PCA & DES by Dr Marlou Porch  Patient's BP has been controlled at home.  Today's BP is at goal - 126/70. Patient denies any cardiac symptoms as chest pain, palpitations, shortness of breath, dizziness or ankle swelling.     Patient's hyperlipidemia is controlled with diet and Atorvastatin. Patient denies myalgias or other medication SE's. Last lipids were at goal:  Lab Results  Component Value Date   CHOL 150 03/09/2019   HDL 81 03/09/2019   LDLCALC 48 03/09/2019   TRIG 126 03/09/2019   CHOLHDL 1.9 03/09/2019       Patient is monitored expectantly fort glucose intolerance  and patient denies reactive hypoglycemic symptoms, visual blurring, diabetic polys or paresthesias. Last A1c was Normal & at goal:  Lab Results  Component Value Date   HGBA1C 5.4 03/09/2019        Finally, patient has history of Vitamin D Deficiency  ("36" / 2018)  and last vitamin D was low (goal 70-100):  Lab Results  Component Value Date   VD25OH 42 03/09/2019    Current Outpatient Medications on File Prior to Visit  Medication Sig  . aspirin EC 81 MG tablet Take 1 tablet (81 mg total) by mouth daily.  Marland Kitchen atenolol (TENORMIN) 25 MG tablet Take 1 tablet Daily for BP  . atorvastatin (LIPITOR) 40 MG tablet Take 1 tablet Daily for  Cholesterol  . Cholecalciferol 5000 units CHEW Chew 10,000 Units by mouth daily.  Marland Kitchen losartan (COZAAR) 100 MG tablet Take 1  tablet Daily for BP  . nitroGLYCERIN (NITROSTAT) 0.4 MG SL tablet Place 1 tablet (0.4 mg total) under the tongue every 5 (five) minutes as needed for chest pain.  . pantoprazole (PROTONIX) 40 MG tablet TAKE 1 TABLET DAILY FOR ACID INDIGESTION & REFLUX   No current facility-administered medications on file prior to visit.    Past Medical History:  Diagnosis Date  . Coronary artery disease    a. Cath ~ 25 yrs ago, dx w/ pleurisy;  b. 07/2015 Cath/PCI: LM nl, LAD 68m (2.75x24 Synergy DES), LCX nl, RCA nl.  Marland Kitchen GERD (gastroesophageal reflux disease)   . Hypertensive heart disease   . Tobacco abuse    Health Maintenance  Topic Date Due  . Hepatitis C Screening  Never done  . COVID-19 Vaccine (1) Never done  . HIV Screening  Never done  . COLONOSCOPY  Never done  . INFLUENZA VACCINE  10/25/2019  . TETANUS/TDAP  01/24/2026   Immunization History  Administered Date(s) Administered  . PPD Test 05/28/2017, 08/27/2018  . Tdap 01/25/2016   Last Colon - declined by patient in the past - Today he expresses willingness to do a Cologard  Past Surgical History:  Procedure Laterality Date  . BACK SURGERY    . CARDIAC  CATHETERIZATION N/A 08/11/2015   Procedure: Left Heart Cath and Coronary Angiography;  Surgeon: Jerline Pain, MD;  Location: [facility];  Service: Cardiovascular;  Laterality: N/A;  . CARDIAC CATHETERIZATION N/A 08/11/2015   Procedure: Coronary Stent Intervention;  Surgeon: Jerline Pain, MD;  Location: [facility];  Service: Cardiovascular;  Laterality: N/A;  . CARDIAC CATHETERIZATION N/A 08/11/2015   Procedure: Coronary Stent Intervention;  Surgeon: Jettie Booze, MD;  Location: [facility];  Service: Cardiovascular;  Laterality: N/A;  . LUMBAR DISC SURGERY     Family History  Problem Relation Age of Onset  . Stroke Father     Social History   Socioeconomic History  . Marital status: Legally Separated    Spouse name: Not on file  . Number of children: 2 daughters & 2 GC  Occupational History  . Owner of a metal fabrication shop  Tobacco Use  . Smoking status: Current Every Day Smoker    Packs/day: 1.50    Years: 41.00    Pack years: 61.50    Types: Cigarettes    Start date: 68  . Smokeless tobacco: Never Used  Substance and Sexual Activity  . Alcohol use: Yes    Alcohol/week: 40.0 standard drinks    Types: 40 Shots of liquor per week    Comment: 08/11/2015 drinks 3-4 fifths of liquor a week  . Drug use: No  . Sexual activity: Not on file     ROS Constitutional: Denies fever, chills, weight loss/gain, headaches, insomnia,  night sweats or change in appetite. Does c/o fatigue. Eyes: Denies redness, blurred vision, diplopia, discharge, itchy or watery eyes.  ENT: Denies discharge, congestion, post nasal drip, epistaxis, sore throat, earache, hearing loss, dental pain, Tinnitus, Vertigo, Sinus pain or snoring.  Cardio: Denies chest pain, palpitations, irregular heartbeat, syncope, dyspnea, diaphoresis, orthopnea, PND, claudication or edema Respiratory: denies cough, dyspnea, DOE, pleurisy, hoarseness, laryngitis or wheezing.  Gastrointestinal: Denies dysphagia, heartburn, reflux, water brash, pain, cramps, nausea, vomiting, bloating, diarrhea, constipation, hematemesis, melena, hematochezia, jaundice or hemorrhoids Genitourinary: Denies dysuria, frequency, urgency, nocturia, hesitancy, discharge, hematuria or flank pain Musculoskeletal: Denies arthralgia, myalgia, stiffness, Jt. Swelling, pain, limp or strain/sprain. Denies Falls. Skin: Denies puritis, rash, hives, warts, acne, eczema or change in skin lesion Neuro: No weakness, tremor, incoordination, spasms, paresthesia or pain Psychiatric: Denies confusion, memory loss or sensory loss. Denies Depression. Endocrine: Denies change in weight, skin,  hair change, nocturia, and paresthesia, diabetic polys, visual blurring or hyper / hypo glycemic episodes.  Heme/Lymph: No excessive bleeding, bruising or enlarged lymph nodes.  Physical Exam  BP 126/70   Pulse 64   Temp (!) 97 F (36.1 C)   Resp 16   Ht 6' 3.5" (1.918 m)   Wt 196 lb 12.8 oz (89.3 kg)   BMI 24.27 kg/m   General Appearance: Well nourished and well groomed and in no apparent distress.  Eyes: PERRLA, EOMs, conjunctiva no swelling or erythema, normal fundi and vessels. Sinuses: No frontal/maxillary tenderness ENT/Mouth: EACs patent / TMs  nl. Nares clear without erythema, swelling, mucoid exudates. Oral hygiene is good. No erythema, swelling, or exudate. Tongue normal, non-obstructing. Tonsils not swollen or erythematous. Hearing normal.  Neck: Supple, thyroid not palpable. No bruits, nodes or JVD. Respiratory: Respiratory effort normal.  BS equal and clear bilateral without rales, rhonci, wheezing or stridor. Cardio: Heart sounds are normal with regular rate and rhythm and no murmurs, rubs or gallops. Peripheral pulses are normal and equal bilaterally  without edema. No aortic or femoral bruits. Chest: symmetric with normal excursions and percussion.  Abdomen: Soft, with Nl bowel sounds. Nontender, no guarding, rebound, hernias, masses, or organomegaly.  Lymphatics: Non tender without lymphadenopathy.  Musculoskeletal: Full ROM all peripheral extremities, joint stability, 5/5 strength, and normal gait. Skin: Warm and dry without rashes, lesions, cyanosis, clubbing or  ecchymosis.  Neuro: Cranial nerves intact, reflexes equal bilaterally. Normal muscle tone, no cerebellar symptoms. Sensation intact.  Pysch: Alert and oriented X 3 with normal affect, insight and judgment appropriate.   Assessment and Plan  1. Annual Preventative/Screening Exam    2. Essential hypertension  - EKG 12-Lead - [place], RETROPERITNL ABD,  LTD - Urinalysis, Routine w reflex microscopic -  Microalbumin / creatinine urine ratio - CBC with Differential/Platelet - COMPLETE METABOLIC PANEL WITH GFR - Magnesium - TSH  3. Hyperlipidemia, mixed  - EKG 12-Lead - [place], RETROPERITNL ABD,  LTD - Lipid panel - TSH  4. Abnormal glucose  - EKG 12-Lead - [place], RETROPERITNL ABD,  LTD - Hemoglobin A1c - Insulin, random  5. Vitamin D deficiency  - VITAMIN D 25 Hydroxy   6. Coronary artery disease involving native coronary artery of native heart without angina pectoris  - EKG 12-Lead - Lipid panel  7. Testosterone deficiency  - Testosterone  8. Screening for colorectal cancer  - [place], RETROPERITNL ABD,  LTD  9. Screening for prostate cancer  - PSA  10. Screening for ischemic heart disease  - EKG 12-Lead  11. FH: cardiovascular disease  - EKG 12-Lead - [place], RETROPERITNL ABD,  LTD  12. Smoker  - EKG 12-Lead - [place], RETROPERITNL ABD,  LTD  13. Screening for AAA (aortic abdominal aneurysm)  - EKG 12-Lead - [place], RETROPERITNL ABD,  LTD  14. Fatigue, unspecified type  - Iron,Total/Total Iron Binding Cap - Vitamin B12 - Testosterone - CBC with Differential/Platelet - TSH  15. Medication management  - Microalbumin / creatinine urine ratio - CBC with Differential/Platelet - COMPLETE METABOLIC PANEL WITH GFR - Magnesium - Lipid panel - TSH - Hemoglobin A1c - Insulin, random - VITAMIN D 25 Hydroxy  16. Screening-pulmonary TB  - TB Skin Test         Patient was counseled in prudent diet, weight control to achieve/maintain BMI less than 25, BP monitoring, regular exercise and medications as discussed.  Discussed med effects and SE's. Routine screening labs and tests as requested with regular follow-up as recommended. Over 40 minutes of exam, counseling, chart review and high complex critical decision making was performed   Kirtland Bouchard, MD

## 2019-09-09 ENCOUNTER — Ambulatory Visit: Payer: 59 | Admitting: Internal Medicine

## 2019-09-09 ENCOUNTER — Other Ambulatory Visit: Payer: Self-pay

## 2019-09-09 VITALS — BP 126/70 | HR 64 | Temp 97.0°F | Resp 16 | Ht 75.5 in | Wt 196.8 lb

## 2019-09-09 DIAGNOSIS — Z87891 Personal history of nicotine dependence: Secondary | ICD-10-CM | POA: Diagnosis not present

## 2019-09-09 DIAGNOSIS — Z136 Encounter for screening for cardiovascular disorders: Secondary | ICD-10-CM

## 2019-09-09 DIAGNOSIS — R7309 Other abnormal glucose: Secondary | ICD-10-CM

## 2019-09-09 DIAGNOSIS — I10 Essential (primary) hypertension: Secondary | ICD-10-CM

## 2019-09-09 DIAGNOSIS — Z8249 Family history of ischemic heart disease and other diseases of the circulatory system: Secondary | ICD-10-CM

## 2019-09-09 DIAGNOSIS — Z79899 Other long term (current) drug therapy: Secondary | ICD-10-CM

## 2019-09-09 DIAGNOSIS — F17200 Nicotine dependence, unspecified, uncomplicated: Secondary | ICD-10-CM

## 2019-09-09 DIAGNOSIS — R5383 Other fatigue: Secondary | ICD-10-CM

## 2019-09-09 DIAGNOSIS — Z111 Encounter for screening for respiratory tuberculosis: Secondary | ICD-10-CM

## 2019-09-09 DIAGNOSIS — E559 Vitamin D deficiency, unspecified: Secondary | ICD-10-CM

## 2019-09-09 DIAGNOSIS — Z0000 Encounter for general adult medical examination without abnormal findings: Secondary | ICD-10-CM | POA: Diagnosis not present

## 2019-09-09 DIAGNOSIS — I2510 Atherosclerotic heart disease of native coronary artery without angina pectoris: Secondary | ICD-10-CM | POA: Diagnosis not present

## 2019-09-09 DIAGNOSIS — Z125 Encounter for screening for malignant neoplasm of prostate: Secondary | ICD-10-CM

## 2019-09-09 DIAGNOSIS — Z1211 Encounter for screening for malignant neoplasm of colon: Secondary | ICD-10-CM

## 2019-09-09 DIAGNOSIS — E782 Mixed hyperlipidemia: Secondary | ICD-10-CM

## 2019-09-09 DIAGNOSIS — Z0001 Encounter for general adult medical examination with abnormal findings: Secondary | ICD-10-CM

## 2019-09-09 DIAGNOSIS — E349 Endocrine disorder, unspecified: Secondary | ICD-10-CM

## 2019-09-10 LAB — CBC WITH DIFFERENTIAL/PLATELET
Absolute Monocytes: 428 cells/uL (ref 200–950)
Basophils Absolute: 60 cells/uL (ref 0–200)
Basophils Relative: 1.3 %
Eosinophils Absolute: 170 cells/uL (ref 15–500)
Eosinophils Relative: 3.7 %
HCT: 45.7 % (ref 38.5–50.0)
Hemoglobin: 15.2 g/dL (ref 13.2–17.1)
Lymphs Abs: 1964 cells/uL (ref 850–3900)
MCH: 32 pg (ref 27.0–33.0)
MCHC: 33.3 g/dL (ref 32.0–36.0)
MCV: 96.2 fL (ref 80.0–100.0)
MPV: 11.4 fL (ref 7.5–12.5)
Monocytes Relative: 9.3 %
Neutro Abs: 1978 cells/uL (ref 1500–7800)
Neutrophils Relative %: 43 %
Platelets: 203 10*3/uL (ref 140–400)
RBC: 4.75 10*6/uL (ref 4.20–5.80)
RDW: 12.7 % (ref 11.0–15.0)
Total Lymphocyte: 42.7 %
WBC: 4.6 10*3/uL (ref 3.8–10.8)

## 2019-09-10 LAB — URINALYSIS, ROUTINE W REFLEX MICROSCOPIC
Bilirubin Urine: NEGATIVE
Glucose, UA: NEGATIVE
Hgb urine dipstick: NEGATIVE
Ketones, ur: NEGATIVE
Leukocytes,Ua: NEGATIVE
Nitrite: NEGATIVE
Protein, ur: NEGATIVE
Specific Gravity, Urine: 1.021 (ref 1.001–1.03)
pH: <=5 (ref 5.0–8.0)

## 2019-09-10 LAB — LIPID PANEL
Cholesterol: 146 mg/dL (ref <200)
HDL: 65 mg/dL (ref >=40)
LDL Cholesterol (Calc): 51 mg/dL (calc)
Non-HDL Cholesterol (Calc): 81 mg/dL (calc) (ref <130)
Total CHOL/HDL Ratio: 2.2 (calc) (ref <5.0)
Triglycerides: 253 mg/dL — ABNORMAL HIGH (ref <150)

## 2019-09-10 LAB — MICROALBUMIN / CREATININE URINE RATIO
Creatinine, Urine: 187 mg/dL (ref 20–320)
Microalb, Ur: <0.2 mg/dL

## 2019-09-10 LAB — COMPLETE METABOLIC PANEL WITH GFR
AG Ratio: 1.6 (calc) (ref 1.0–2.5)
ALT: 37 U/L (ref 9–46)
AST: 25 U/L (ref 10–35)
Albumin: 4.4 g/dL (ref 3.6–5.1)
Alkaline phosphatase (APISO): 72 U/L (ref 35–144)
BUN: 14 mg/dL (ref 7–25)
CO2: 28 mmol/L (ref 20–32)
Calcium: 9.4 mg/dL (ref 8.6–10.3)
Chloride: 104 mmol/L (ref 98–110)
Creat: 1.05 mg/dL (ref 0.70–1.33)
GFR, Est African American: 90 mL/min/{1.73_m2} (ref >=60)
GFR, Est Non African American: 78 mL/min/{1.73_m2} (ref >=60)
Globulin: 2.8 g/dL (calc) (ref 1.9–3.7)
Glucose, Bld: 103 mg/dL — ABNORMAL HIGH (ref 65–99)
Potassium: 4.2 mmol/L (ref 3.5–5.3)
Sodium: 141 mmol/L (ref 135–146)
Total Bilirubin: 0.6 mg/dL (ref 0.2–1.2)
Total Protein: 7.2 g/dL (ref 6.1–8.1)

## 2019-09-10 LAB — MAGNESIUM: Magnesium: 2.3 mg/dL (ref 1.5–2.5)

## 2019-09-10 LAB — IRON, TOTAL/TOTAL IRON BINDING CAP
%SAT: 37 % (calc) (ref 20–48)
Iron: 117 ug/dL (ref 50–180)
TIBC: 314 mcg/dL (calc) (ref 250–425)

## 2019-09-10 LAB — TSH: TSH: 0.38 mIU/L — ABNORMAL LOW (ref 0.40–4.50)

## 2019-09-10 LAB — VITAMIN B12: Vitamin B-12: 520 pg/mL (ref 200–1100)

## 2019-09-10 LAB — HEMOGLOBIN A1C
Hgb A1c MFr Bld: 5.2 % of total Hgb (ref <5.7)
Mean Plasma Glucose: 103 (calc)
eAG (mmol/L): 5.7 (calc)

## 2019-09-10 LAB — VITAMIN D 25 HYDROXY (VIT D DEFICIENCY, FRACTURES): Vit D, 25-Hydroxy: 94 ng/mL (ref 30–100)

## 2019-09-10 LAB — PSA: PSA: 0.3 ng/mL (ref <=4.0)

## 2019-09-10 LAB — TESTOSTERONE: Testosterone: 280 ng/dL (ref 250–827)

## 2019-09-10 LAB — INSULIN, RANDOM: Insulin: 21.1 u[IU]/mL — ABNORMAL HIGH

## 2019-09-10 NOTE — Progress Notes (Signed)
==========================================================  -   Kidney Functions are low Stage 3b - Almost in Stage 4 Chronic Kidney Disease   - Very important to be sure drinking adequate amounts of liquids / fluids -    - Recommend 6 bottles (16 oz) of fluids / day ==========================================================  - Total Chol = 178 and LDL Chol = 76   - Both Excellent - Very low risk for Heart Attack / Stroke ==========================================================  - A1c - Normal - Great - No Diabetes ==========================================================  - Vitamin D = 88 - Excellent   ==========================================================  - All Else - CBC - Electrolytes - Liver - Magnesium & Thyroid - all Normal / OK ==========================================================

## 2019-09-10 NOTE — Progress Notes (Signed)
Comments Below are incorrect - and were posted to wrong chart ! =============================++++========================== - Your Kidney Functions are Perfectly Normal ! ==========================================================  - Your PSA is very Low - Great  ===========================================================  -  Testosterone Level is Normal & OK  ===========================================================  -  Your Chol = 146 and LDL Chol = 51 - Both Excellent   -  Very low risk for Heart Attack  / Stroke ===========================================================  -  Thyroid is slightly out of range & will recheck at next Office Visit ===========================================================  -  A1c = 5.2% - excellent - No Diabetes ===========================================================  -  Please call office if you have an  questions about labs ===========================================================

## 2019-09-11 ENCOUNTER — Telehealth: Payer: Self-pay | Admitting: *Deleted

## 2019-09-11 LAB — TB SKIN TEST
Induration: 0 mm
TB Skin Test: NEGATIVE

## 2019-09-11 NOTE — Telephone Encounter (Signed)
Cologuard order faxed to [facility]. 

## 2019-09-14 ENCOUNTER — Encounter: Payer: 59 | Admitting: Internal Medicine

## 2019-09-14 NOTE — Progress Notes (Signed)
   History of Present Illness:  This very nice 59 yo sepWM with HTN present with concerns re: a "lump" of his Rt knee - prepatellar area that seemingly has recently been increasing in size and has been tender to touch.   Medications  .  atenolol (TENORMIN) 25 MG tablet, Take 1 tablet Daily for BP .  atorvastatin (LIPITOR) 40 MG tablet, Take 1 tablet Daily for Cholesterol .  losartan (COZAAR) 100 MG tablet, Take 1  tablet Daily for BP .  nitroGLYCERIN (NITROSTAT) 0.4 MG SL tablet, Place 1 tablet (0.4 mg total) under the tongue every 5 (five) minutes as needed for chest pain. Marland Kitchen  aspirin EC 81 MG tablet, Take 1 tablet (81 mg total) by mouth daily. .  Cholecalciferol 5000 units CHEW, Chew 10,000 Units by mouth daily. .  pantoprazole (PROTONIX) 40 MG tablet, TAKE 1 TABLET DAILY FOR ACID INDIGESTION & REFLUX  Problem list He has Unstable angina ([place]); Hypertensive heart disease; Tobacco abuse; GERD (gastroesophageal reflux disease); Coronary artery disease; HTN (hypertension); Hyperlipidemia, mixed; Vitamin D deficiency; History of smoking greater than 50 pack years; Abnormal glucose; and BPH with obstruction/lower urinary tract symptoms on their problem list.   Observations/Objective:   BP 140/82   Pulse 64   Temp (!) 97.5 F (36.4 C)   Resp 16   Ht 6' 3.5" (1.918 m)   Wt 196 lb (88.9 kg)   BMI 24.17 kg/m   HEENT - WNL. Neck - supple.  Chest - Clear equal BS. Cor - Nl HS. RRR w/o sig MGR. PP 1(+). No edema. MS- FROM w/o deformities.  Gait Nl. Neuro -  Nl w/o focal abnormalities. Skin - There is a 2 .0 x 3.5 cm soft circumscribed mobile mass in the Rt prepatellar area.   Procedure (CPT 11402)    After informed consent and aseptic prep to the Rt prepatellar area, the area over the lesion was anesthetized with 3 ml of Marcaine 0.5% sub-cy=ut. Then with a #10 scalpel, a longitudinal 4 cm incision was made. Then the sub-cut mass was sharply dissected free with the #10 scalpel and  dissecting scissors to reveal a smooth soft apparent lipoma,. The wound cavity was quenched with hydrogen peroxide and the the wound edges were aligned and approximated with # 6 vertical mattress sutures of  Nylon 3-0. The the wound edges  were closed with a running lock stitch of # 6 sutures of Nylon 3-0. Wound was dressed with sterile dressing . Patient was instructed in wound care & ROV ~ 12 days for partial suture removal.   Assessment and Plan:  1. Essential hypertension  2. Lipoma of Rt lower extremity  Follow Up Instructions:  ROV - 12 days       I discussed the assessment and treatment plan with the patient. The patient was provided an opportunity to ask questions and all were answered. The patient agreed with the plan and demonstrated an understanding of the instructions.  Kirtland Bouchard, MD

## 2019-09-15 ENCOUNTER — Other Ambulatory Visit: Payer: Self-pay

## 2019-09-15 ENCOUNTER — Encounter: Payer: Self-pay | Admitting: Internal Medicine

## 2019-09-15 ENCOUNTER — Ambulatory Visit: Payer: 59 | Admitting: Internal Medicine

## 2019-09-15 VITALS — BP 140/82 | HR 64 | Temp 97.5°F | Resp 16 | Ht 75.5 in | Wt 196.0 lb

## 2019-09-15 DIAGNOSIS — I10 Essential (primary) hypertension: Secondary | ICD-10-CM

## 2019-09-15 DIAGNOSIS — D1720 Benign lipomatous neoplasm of skin and subcutaneous tissue of unspecified limb: Secondary | ICD-10-CM

## 2019-09-25 ENCOUNTER — Ambulatory Visit (INDEPENDENT_AMBULATORY_CARE_PROVIDER_SITE_OTHER): Payer: 59 | Admitting: Internal Medicine

## 2019-09-25 ENCOUNTER — Encounter: Payer: Self-pay | Admitting: Internal Medicine

## 2019-09-25 ENCOUNTER — Other Ambulatory Visit: Payer: Self-pay

## 2019-09-25 VITALS — BP 120/84 | HR 68 | Temp 97.4°F | Resp 16 | Ht 75.5 in | Wt 198.6 lb

## 2019-09-25 DIAGNOSIS — D1720 Benign lipomatous neoplasm of skin and subcutaneous tissue of unspecified limb: Secondary | ICD-10-CM

## 2019-09-25 DIAGNOSIS — I209 Angina pectoris, unspecified: Secondary | ICD-10-CM

## 2019-09-25 NOTE — Progress Notes (Signed)
  Wound over Rt patella appears well healed , clean  And w/o signs of infection  - All sutures removed w/o difficulty and antibiotic ung & sterile dsg applied.

## 2019-09-29 ENCOUNTER — Telehealth: Payer: Self-pay | Admitting: *Deleted

## 2019-09-29 ENCOUNTER — Other Ambulatory Visit: Payer: Self-pay | Admitting: Internal Medicine

## 2019-09-29 DIAGNOSIS — E782 Mixed hyperlipidemia: Secondary | ICD-10-CM

## 2019-09-29 LAB — EXTERNAL GENERIC LAB PROCEDURE: COLOGUARD: NEGATIVE

## 2019-09-29 LAB — COLOGUARD
COLOGUARD: NEGATIVE
Cologuard: NEGATIVE

## 2019-09-29 MED ORDER — ATORVASTATIN CALCIUM 40 MG PO TABS: ORAL_TABLET | ORAL | 3 refills | Status: DC

## 2019-09-29 NOTE — Telephone Encounter (Signed)
Patient is aware of negative Cologuard results.

## 2019-10-01 ENCOUNTER — Other Ambulatory Visit: Payer: Self-pay | Admitting: *Deleted

## 2019-10-01 DIAGNOSIS — I2510 Atherosclerotic heart disease of native coronary artery without angina pectoris: Secondary | ICD-10-CM

## 2019-10-01 MED ORDER — NITROGLYCERIN 0.4 MG SL SUBL: 0.4000 mg | SUBLINGUAL_TABLET | SUBLINGUAL | 6 refills | Status: DC | PRN

## 2019-11-19 ENCOUNTER — Other Ambulatory Visit: Payer: Self-pay | Admitting: Internal Medicine

## 2019-12-10 ENCOUNTER — Ambulatory Visit: Payer: 59 | Admitting: Adult Health

## 2019-12-14 NOTE — Progress Notes (Deleted)
FOLLOW UP  Assessment and Plan:   CAD Control blood pressure, cholesterol, glucose, increase exercise low risk stress myoview in 10/2018 Stop smoking Followed by cardiology  Hypertension Continue medications; recheck today fairly controlled Advised to avoid smoking right prior to OV Monitor blood pressure at home; patient to call if consistently greater than 130/80 Continue DASH diet.   Reminder to go to the ER if any CP, SOB, nausea, dizziness, severe HA, changes vision/speech, left arm numbness and tingling and jaw pain.  Cholesterol At goal; continue medication Continue low cholesterol diet and exercise.  Check lipid panel.   Tobacco use Discussed risks associated with tobacco use and advised to reduce or quit Patient is ready to do so and plans to quit 01/25/2019 Prescription for chantix with information provided Will follow up at the next visit Monitor by imaging annually at CPE, Low dose CT discussed today, info provided, patient is interested and will consider,   GERD Symptoms well managed without breakthrough Failed trial to taper off of PPI Cut back on ETOH  Vitamin D Def He has increased supplement and taking 10000 IU daily; near goal at last check Defer vit D  Hypogonadism After discussion today he declines to initiate testosterone injections due to needle size He would prefer to work on lifestyle; fatigue has improved some Suggested initiate zinc 50 mg daily, HIIT exercises  Continue diet and meds as discussed. Further disposition pending results of labs. Discussed med's effects and SE's.   Over 30 minutes of exam, counseling, chart review, and critical decision making was performed.   Future Appointments  Date Time Provider [place]  12/16/2019  3:30 PM [name], NP [facility] None  03/10/2020 11:00 AM Unk Pinto, MD [facility] None  09/08/2020 11:00 AM Unk Pinto, MD [facility] None     ----------------------------------------------------------------------------------------------------------------------  HPI 59 y.o. male  presents for 3 month follow up on hypertension, continued tobacco use and cholesterol;   he currently continues to smoke 1.5 pack a day, for 40+ years ; discussed risks associated with smoking, patient is not ready to quit.  No recent imaging, Low dose CT scan discussed, information provided to review last time, declines any at this time. ***  He admits to drinking 5-8 drinks "shots of hard liquor" *** per day? *** add dx *** - has cut back, not ready to decrease further at this time. Risks discussed and encouraged to consider strongly.   he has a diagnosis of GERD which is currently managed by protonix 40 mg daily, failed attempt to taper with H2i (high dose ranitidine)  he reports symptoms is currently well controlled, and denies breakthrough reflux, burning in chest, hoarseness or cough.    BMI is There is no height or weight on file to calculate BMI., he has not been working on diet and exercise. Wt Readings from Last 3 Encounters:  09/25/19 198 lb 9.6 oz (90.1 kg)  09/15/19 196 lb (88.9 kg)  09/09/19 196 lb 12.8 oz (89.3 kg)   He is s/p mid LAD 90% lesion stented in mid May 2017, followed by Dr. Marlou Porch.  Underwent stress myoview in 10/2018 which showed low risk study; patient denies episodes of angina  His blood pressure has not been controlled at home, today their BP is    He does workout. He denies chest pain, shortness of breath, dizziness.   He is on cholesterol medication (atorvastatin 20 mg daily) and denies myalgias. His cholesterol is at goal. The cholesterol last visit was:   Lab  Results  Component Value Date   CHOL 146 09/09/2019   HDL 65 09/09/2019   LDLCALC 51 09/09/2019   TRIG 253 (H) 09/09/2019   CHOLHDL 2.2 09/09/2019   Last A1C in the office was:  Lab Results  Component Value Date   HGBA1C 5.2 09/09/2019   Patient is  on Vitamin D supplement, now taking 10000 IU daily  Lab Results  Component Value Date   VD25OH 94 09/09/2019     He has a history of testosterone deficiency, does endorse some low energy. Exercising less than he used to.  States he no longer wants to do testosterone supplement. He has not tried zinc supplementation. *** Lab Results  Component Value Date   TESTOSTERONE 280 09/09/2019    Current Medications:  Current Outpatient Medications on File Prior to Visit  Medication Sig  . aspirin EC 81 MG tablet Take 1 tablet (81 mg total) by mouth daily.  Marland Kitchen atenolol (TENORMIN) 25 MG tablet TAKE 1 TABLET DAILY FOR BLOOD PRESSURE  . atorvastatin (LIPITOR) 40 MG tablet Take 1 tablet Daily for Cholesterol  . Cholecalciferol 5000 units CHEW Chew 10,000 Units by mouth daily.  Marland Kitchen losartan (COZAAR) 100 MG tablet Take 1  tablet Daily for BP  . nitroGLYCERIN (NITROSTAT) 0.4 MG SL tablet Place 1 tablet (0.4 mg total) under the tongue every 5 (five) minutes as needed for chest pain.  . pantoprazole (PROTONIX) 40 MG tablet TAKE 1 TABLET DAILY FOR ACID INDIGESTION & REFLUX   No current facility-administered medications on file prior to visit.     Allergies: Not on File   Medical History:  Past Medical History:  Diagnosis Date  . Coronary artery disease    a. Cath ~ 25 yrs ago, dx w/ pleurisy;  b. 07/2015 Cath/PCI: LM nl, LAD 67m (2.75x24 Synergy DES), LCX nl, RCA nl.  Marland Kitchen GERD (gastroesophageal reflux disease)   . Hypertensive heart disease   . Tobacco abuse    Family history- Reviewed and unchanged Social history- Reviewed and unchanged   Review of Systems:  Review of Systems  Constitutional: Negative for malaise/fatigue and weight loss.  HENT: Negative for hearing loss and tinnitus.   Eyes: Negative for blurred vision and double vision.  Respiratory: Negative for cough, shortness of breath and wheezing.   Cardiovascular: Negative for chest pain, palpitations, orthopnea, claudication and leg  swelling.  Gastrointestinal: Negative for abdominal pain, blood in stool, constipation, diarrhea, heartburn, melena, nausea and vomiting.  Genitourinary: Negative.   Musculoskeletal: Negative for joint pain and myalgias.  Skin: Negative for rash.  Neurological: Negative for dizziness, tingling, sensory change, weakness and headaches.  Endo/Heme/Allergies: Negative for polydipsia.  Psychiatric/Behavioral: Negative.   All other systems reviewed and are negative.   Physical Exam: There were no vitals taken for this visit. Wt Readings from Last 3 Encounters:  09/25/19 198 lb 9.6 oz (90.1 kg)  09/15/19 196 lb (88.9 kg)  09/09/19 196 lb 12.8 oz (89.3 kg)   General Appearance: Well nourished, in no apparent distress. Eyes: PERRLA, EOMs, conjunctiva no swelling or erythema Sinuses: No Frontal/maxillary tenderness ENT/Mouth: Ext aud canals clear, TMs without erythema, bulging. No erythema, swelling, or exudate on post pharynx.  Tonsils not swollen or erythematous. Hearing normal.  Neck: Supple, thyroid normal.  Respiratory: Respiratory effort normal, BS equal bilaterally without rales, rhonchi, wheezing or stridor.  Cardio: RRR with no MRGs. Brisk peripheral pulses without edema.  Abdomen: Soft, + BS.  Non tender, no guarding, rebound, hernias, masses. Lymphatics: Non tender  without lymphadenopathy.  Musculoskeletal: Full ROM, 5/5 strength, Normal gait Skin: Warm, dry without rashes, lesions, ecchymosis.  Neuro: Cranial nerves intact. No cerebellar symptoms.  Psych: Awake and oriented X 3, normal affect, Insight and Judgment appropriate.    Izora Ribas, NP 3:50 PM [facility]

## 2019-12-16 ENCOUNTER — Ambulatory Visit: Payer: 59 | Admitting: Adult Health

## 2019-12-21 NOTE — Progress Notes (Signed)
FOLLOW UP  Assessment and Plan:   CAD Control blood pressure, cholesterol, glucose, increase exercise low risk stress myoview in 10/2018, denies angina Stop smoking Followed by cardiology  Hypertension Continue medications; fairly controlled Advised to avoid smoking right prior to OV Monitor blood pressure at home; patient to call if consistently greater than 130/80 Continue DASH diet.   Reminder to go to the ER if any CP, SOB, nausea, dizziness, severe HA, changes vision/speech, left arm numbness and tingling and jaw pain.  Cholesterol At goal; continue medication Continue low cholesterol diet and exercise.  Check lipid panel.   Tobacco use Discussed risks associated with tobacco use and advised to reduce or quit Patient is ready to do so and plans start tapering Prescription for chantix with information provided Will follow up at the next visit Monitor by imaging annually at CPE, Low dose CT discussed today, info provided, patient is interested and agreeable to schedule if insurance will cover  GERD Symptoms well managed without breakthrough Failed trial to taper off of PPI Cut back on ETOH  Vitamin D Def He has increased supplement and taking 10000 IU daily; near goal at last check Defer vit D  Hypogonadism He declines to initiate testosterone injections  He would prefer to work on lifestyle; fatigue has improved some Suggested initiate zinc 50 mg daily,   Continue diet and meds as discussed. Further disposition pending results of labs. Discussed med's effects and SE's.   Over 30 minutes of exam, counseling, chart review, and critical decision making was performed.   Future Appointments  Date Time Provider [place]  03/10/2020 11:00 AM Unk Pinto, MD [facility] None  09/08/2020 11:00 AM Unk Pinto, MD [facility] None     ----------------------------------------------------------------------------------------------------------------------  HPI 59 y.o. male  presents for 3 month follow up on hypertension, continued tobacco use and cholesterol;   he currently continues to smoke 1.5 pack a day, for 40+ years ; discussed risks associated with smoking, patient is ready to quit. Interested in chantix if can afford. No recent imaging, Low dose CT scan discussed, information provided to review last time, agreeable to schedule.   He admits to drinking 5-6 drinks "shots of hard liquor", has reduced from 8 per day since last visit, drinks once he gets home from work, no urge to drink while working, hands don't shake. Will continue to slow taper.   he has a diagnosis of GERD which is currently managed by protonix 40 mg daily, failed attempt to taper with H2i (high dose ranitidine)  he reports symptoms is currently well controlled, and denies breakthrough reflux, burning in chest, hoarseness or cough.    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  Wt Readings from Last 3 Encounters:  12/22/19 195 lb 12.8 oz (88.8 kg)  09/25/19 198 lb 9.6 oz (90.1 kg)  09/15/19 196 lb (88.9 kg)   He is s/p mid LAD 90% lesion stented in mid May 2017, followed by Dr. Marlou Porch.  Underwent stress myoview in 10/2018 which showed low risk study; patient denies episodes of angina  His blood pressure has not been controlled at home, today their BP is BP: 124/88  He does workout. He denies chest pain, shortness of breath, dizziness.   He is on cholesterol medication (atorvastatin 20 mg daily) and denies myalgias. His cholesterol is at goal. The cholesterol last visit was:   Lab Results  Component  Value Date   CHOL 146 09/09/2019   HDL 65 09/09/2019   LDLCALC 51 09/09/2019   TRIG 253 (H) 09/09/2019   CHOLHDL 2.2 09/09/2019   Last A1C in the office  was:  Lab Results  Component Value Date   HGBA1C 5.2 09/09/2019   Patient is on Vitamin D supplement, now taking 10000 IU daily  Lab Results  Component Value Date   VD25OH 94 09/09/2019     He has a history of testosterone deficiency, does endorse some low energy. Exercising less than he used to. States he no longer wants to do testosterone supplement. He has not tried zinc supplementation, agreeable to try.  Lab Results  Component Value Date   TESTOSTERONE 280 09/09/2019    Current Medications:  Current Outpatient Medications on File Prior to Visit  Medication Sig  . aspirin EC 81 MG tablet Take 1 tablet (81 mg total) by mouth daily.  Marland Kitchen atenolol (TENORMIN) 25 MG tablet TAKE 1 TABLET DAILY FOR BLOOD PRESSURE  . atorvastatin (LIPITOR) 40 MG tablet Take 1 tablet Daily for Cholesterol  . Cholecalciferol 5000 units CHEW Chew 10,000 Units by mouth daily.  Marland Kitchen losartan (COZAAR) 100 MG tablet Take 1  tablet Daily for BP  . nitroGLYCERIN (NITROSTAT) 0.4 MG SL tablet Place 1 tablet (0.4 mg total) under the tongue every 5 (five) minutes as needed for chest pain.  . pantoprazole (PROTONIX) 40 MG tablet TAKE 1 TABLET DAILY FOR ACID INDIGESTION & REFLUX   No current facility-administered medications on file prior to visit.     Allergies: Not on File   Medical History:  Past Medical History:  Diagnosis Date  . Coronary artery disease    a. Cath ~ 25 yrs ago, dx w/ pleurisy;  b. 07/2015 Cath/PCI: LM nl, LAD 39m (2.75x24 Synergy DES), LCX nl, RCA nl.  Marland Kitchen GERD (gastroesophageal reflux disease)   . Hypertensive heart disease   . Tobacco abuse    Family history- Reviewed and unchanged Social history- Reviewed and unchanged   Review of Systems:  Review of Systems  Constitutional: Negative for malaise/fatigue and weight loss.  HENT: Negative for hearing loss and tinnitus.   Eyes: Negative for blurred vision and double vision.  Respiratory: Negative for cough, shortness of breath and  wheezing.   Cardiovascular: Negative for chest pain, palpitations, orthopnea, claudication and leg swelling.  Gastrointestinal: Negative for abdominal pain, blood in stool, constipation, diarrhea, heartburn, melena, nausea and vomiting.  Genitourinary: Negative.   Musculoskeletal: Negative for joint pain and myalgias.  Skin: Negative for rash.  Neurological: Negative for dizziness, tingling, sensory change, weakness and headaches.  Endo/Heme/Allergies: Negative for polydipsia.  Psychiatric/Behavioral: Negative.   All other systems reviewed and are negative.   Physical Exam: BP 124/88   Pulse (!) 58   Temp (!) 97.5 F (36.4 C)   Wt 195 lb 12.8 oz (88.8 kg)   SpO2 98%   BMI 24.15 kg/m  Wt Readings from Last 3 Encounters:  12/22/19 195 lb 12.8 oz (88.8 kg)  09/25/19 198 lb 9.6 oz (90.1 kg)  09/15/19 196 lb (88.9 kg)   General Appearance: Well nourished, in no apparent distress. Eyes: PERRLA, EOMs, conjunctiva no swelling or erythema Sinuses: No Frontal/maxillary tenderness ENT/Mouth: Ext aud canals clear, TMs without erythema, bulging. No erythema, swelling, or exudate on post pharynx.  Tonsils not swollen or erythematous. Hearing normal.  Neck: Supple, thyroid normal.  Respiratory: Respiratory effort normal, BS equal bilaterally without rales, rhonchi, wheezing or stridor.  Cardio:  RRR with no MRGs. Brisk peripheral pulses without edema.  Abdomen: Soft, + BS.  Non tender, no guarding, rebound, hernias, masses. Lymphatics: Non tender without lymphadenopathy.  Musculoskeletal: Full ROM, 5/5 strength, Normal gait Skin: Warm, dry without rashes, lesions, ecchymosis.  Neuro: Cranial nerves intact. No cerebellar symptoms.  Psych: Awake and oriented X 3, normal affect, Insight and Judgment appropriate.    Izora Ribas, NP 4:10 PM [facility]

## 2019-12-22 ENCOUNTER — Encounter: Payer: Self-pay | Admitting: Adult Health

## 2019-12-22 ENCOUNTER — Ambulatory Visit: Payer: 59 | Admitting: Adult Health

## 2019-12-22 ENCOUNTER — Other Ambulatory Visit: Payer: Self-pay

## 2019-12-22 VITALS — BP 124/88 | HR 58 | Temp 97.5°F | Wt 195.8 lb

## 2019-12-22 DIAGNOSIS — I2510 Atherosclerotic heart disease of native coronary artery without angina pectoris: Secondary | ICD-10-CM

## 2019-12-22 DIAGNOSIS — I10 Essential (primary) hypertension: Secondary | ICD-10-CM

## 2019-12-22 DIAGNOSIS — E782 Mixed hyperlipidemia: Secondary | ICD-10-CM | POA: Diagnosis not present

## 2019-12-22 DIAGNOSIS — Z122 Encounter for screening for malignant neoplasm of respiratory organs: Secondary | ICD-10-CM

## 2019-12-22 DIAGNOSIS — Z87891 Personal history of nicotine dependence: Secondary | ICD-10-CM

## 2019-12-22 DIAGNOSIS — R7309 Other abnormal glucose: Secondary | ICD-10-CM

## 2019-12-22 DIAGNOSIS — F17200 Nicotine dependence, unspecified, uncomplicated: Secondary | ICD-10-CM

## 2019-12-22 DIAGNOSIS — E559 Vitamin D deficiency, unspecified: Secondary | ICD-10-CM

## 2019-12-22 DIAGNOSIS — Z6824 Body mass index (BMI) 24.0-24.9, adult: Secondary | ICD-10-CM

## 2019-12-22 DIAGNOSIS — Z720 Tobacco use: Secondary | ICD-10-CM

## 2019-12-22 DIAGNOSIS — F1010 Alcohol abuse, uncomplicated: Secondary | ICD-10-CM

## 2019-12-22 MED ORDER — ZINC GLUCONATE 50 MG PO TABS: 50.0000 mg | ORAL_TABLET | Freq: Every day | ORAL | Status: AC

## 2019-12-22 MED ORDER — ATORVASTATIN CALCIUM 40 MG PO TABS: ORAL_TABLET | ORAL | 3 refills | Status: DC

## 2019-12-22 MED ORDER — VARENICLINE TARTRATE 1 MG PO TABS: 1.0000 mg | ORAL_TABLET | Freq: Two times a day (BID) | ORAL | 2 refills | Status: DC

## 2019-12-22 NOTE — Patient Instructions (Addendum)
Goals    . Blood Pressure < 130/80    . Quit Smoking     Start tapering to quit    . Reduce alcohol intake     Gradually cut back to <2 drinks per day      Get on zinc 50 mg daily - helps with testosterone and immune system      [place]  American cancer society  [phone] for more information or for a free program for smoking cessation help.   You can call QUIT SMART 1-800-QUIT-NOW for free nicotine patches or replacement therapy- if they are out- keep calling  [facility] Can call for smoking cessation classes, [phone]  If you have a smart phone, please look up Smoke Free app, this will help you stay on track and give you information about money you have saved, life that you have gained back and a ton of more information.   We are giving you chantix for smoking cessation. You can do it! And we are here to help! You may have heard some scary side effects about chantix, the three most common I hear about are nausea, crazy dreams and depression.  However, I like for my patients to try to stay on 1/2 a tablet twice a day rather than one tablet twice a day as normally prescribed. This helps decrease the chances of side effects and helps save money by making a one month prescription last two months  Please start the prescription this way:  Start 1/2 tablet by mouth once daily after food with a full glass of water for 3 days Then do 1/2 tablet by mouth twice daily for 4 days. During this first week you can smoke, but try to stop after this week.  At this point we have several options: 1) continue on 1/2 tablet twice a day- which I encourage you to do. You can stay on this dose the rest of the time on the medication or if you still feel the need to smoke you can do one of the two options below. 2) do one tablet in the morning and 1/2 in the evening which helps decrease dreams. 3) do one tablet twice a day.   What if I miss a dose? If you  miss a dose, take it as soon as you can. If it is almost time for your next dose, take only that dose. Do not take double or extra doses.  What should I watch for while using this medicine? Visit your doctor or health care professional for regular check ups. Ask for ongoing advice and encouragement from your doctor or healthcare professional, friends, and family to help you quit. If you smoke while on this medication, quit again  Your mouth may get dry. Chewing sugarless gum or hard candy, and drinking plenty of water may help. Contact your doctor if the problem does not go away or is severe.  You may get drowsy or dizzy. Do not drive, use machinery, or do anything that needs mental alertness until you know how this medicine affects you. Do not stand or sit up quickly, especially if you are an older patient.   The use of this medicine may increase the chance of suicidal thoughts or actions. Pay special attention to how you are responding while on this medicine. Any worsening of mood, or thoughts of suicide or dying should be reported to your health care professional right away.  ADVANTAGES OF QUITTING SMOKING  Within 20  minutes, blood pressure decreases. Your pulse is at normal level.  After 8 hours, carbon monoxide levels in the blood return to normal. Your oxygen level increases.  After 24 hours, the chance of having a heart attack starts to decrease. Your breath, hair, and body stop smelling like smoke.  After 48 hours, damaged nerve endings begin to recover. Your sense of taste and smell improve.  After 72 hours, the body is virtually free of nicotine. Your bronchial tubes relax and breathing becomes easier.  After 2 to 12 weeks, lungs can hold more air. Exercise becomes easier and circulation improves.  After 1 year, the risk of coronary heart disease is cut in half.  After 5 years, the risk of stroke falls to the same as a nonsmoker.  After 10 years, the risk of lung cancer is cut  in half and the risk of other cancers decreases significantly.  After 15 years, the risk of coronary heart disease drops, usually to the level of a nonsmoker.  You will have extra money to spend on things other than cigarettes.       Lung Cancer Screening A lung cancer screening is a test that checks for lung cancer. Lung cancer screening is done to look for lung cancer in its very early stages, before it spreads and becomes harder to treat and before symptoms appear. Finding cancer early improves the chances of successful treatment. It may save your life. Should I be screened for lung cancer? You should be screened for lung cancer if all of these apply:  You currently smoke or you have quit smoking within the past 15 years.  You are 66-39 years old. Screening may be recommended up to age 64 depending on your overall health and other factors.  You are in good general health.  You have a smoking history of 1 pack a day for 30 years or 2 packs a day for 15 years. Screening may also be recommended if you are at high risk for the disease. You may be at high risk if:  You have a family history of lung cancer.  You have been exposed to asbestos.  You have chronic obstructive pulmonary disease (COPD).  You have a history of previous lung cancer. How often should I be screened for lung cancer?  If you are at risk for lung cancer, it is recommended that you are screened once a year. The recommended screening test is a low-dose CT scan. How can I lower my risk of lung cancer? To lower your risk of developing lung cancer:  If you smoke, stop smoking all tobacco products.  Avoid secondhand smoke.  Avoid exposure to radiation.  Avoid exposure to radon gas. Have your home checked for radon regularly.  Avoid things that cause cancer (carcinogens).  Avoid living or working in places with high air pollution. Where to find more information Ask your health care provider about the risks  and benefits of screening. More information and resources are available from these organizations:  [place] (ACS): www.cancer.org  American Lung Association: www.lung.org Contact a health care provider if:  You start to show symptoms of lung cancer, including: ? Coughing that will not go away. ? Wheezing. ? Chest pain. ? Coughing up blood. ? Shortness of breath. ? Weight loss that cannot be explained. ? Constant fatigue. Summary  Lung cancer screening may find lung cancer before symptoms appear. Finding cancer early improves the chances of successful treatment. It may save your life.  If  you are at risk for lung cancer, it is recommended that you are screened once a year. The recommended screening test is a low-dose CT scan.  You can make lifestyle changes to lower your risk of lung cancer.  Ask your health care provider about the risks and benefits of screening. This information is not intended to replace advice given to you by your health care provider. Make sure you discuss any questions you have with your health care provider. Document Revised: 07/04/2018 Document Reviewed: 02/01/2016 Elsevier Patient Education  2020 [employer].

## 2019-12-23 ENCOUNTER — Other Ambulatory Visit: Payer: Self-pay | Admitting: Adult Health

## 2019-12-23 LAB — CBC WITH DIFFERENTIAL/PLATELET
Absolute Monocytes: 616 cells/uL (ref 200–950)
Basophils Absolute: 71 cells/uL (ref 0–200)
Basophils Relative: 0.9 %
Eosinophils Absolute: 198 cells/uL (ref 15–500)
Eosinophils Relative: 2.5 %
HCT: 44.7 % (ref 38.5–50.0)
Hemoglobin: 15.1 g/dL (ref 13.2–17.1)
Lymphs Abs: 2252 cells/uL (ref 850–3900)
MCH: 31.9 pg (ref 27.0–33.0)
MCHC: 33.8 g/dL (ref 32.0–36.0)
MCV: 94.3 fL (ref 80.0–100.0)
MPV: 11.2 fL (ref 7.5–12.5)
Monocytes Relative: 7.8 %
Neutro Abs: 4764 cells/uL (ref 1500–7800)
Neutrophils Relative %: 60.3 %
Platelets: 210 10*3/uL (ref 140–400)
RBC: 4.74 10*6/uL (ref 4.20–5.80)
RDW: 12.9 % (ref 11.0–15.0)
Total Lymphocyte: 28.5 %
WBC: 7.9 10*3/uL (ref 3.8–10.8)

## 2019-12-23 LAB — LIPID PANEL
Cholesterol: 151 mg/dL (ref <200)
HDL: 79 mg/dL (ref >=40)
LDL Cholesterol (Calc): 43 mg/dL (calc)
Non-HDL Cholesterol (Calc): 72 mg/dL (calc) (ref <130)
Total CHOL/HDL Ratio: 1.9 (calc) (ref <5.0)
Triglycerides: 236 mg/dL — ABNORMAL HIGH (ref <150)

## 2019-12-23 LAB — COMPLETE METABOLIC PANEL WITH GFR
AG Ratio: 1.7 (calc) (ref 1.0–2.5)
ALT: 33 U/L (ref 9–46)
AST: 30 U/L (ref 10–35)
Albumin: 4.7 g/dL (ref 3.6–5.1)
Alkaline phosphatase (APISO): 85 U/L (ref 35–144)
BUN: 19 mg/dL (ref 7–25)
CO2: 28 mmol/L (ref 20–32)
Calcium: 9.7 mg/dL (ref 8.6–10.3)
Chloride: 103 mmol/L (ref 98–110)
Creat: 1.08 mg/dL (ref 0.70–1.33)
GFR, Est African American: 87 mL/min/{1.73_m2} (ref >=60)
GFR, Est Non African American: 75 mL/min/{1.73_m2} (ref >=60)
Globulin: 2.7 g/dL (calc) (ref 1.9–3.7)
Glucose, Bld: 94 mg/dL (ref 65–99)
Potassium: 4.5 mmol/L (ref 3.5–5.3)
Sodium: 138 mmol/L (ref 135–146)
Total Bilirubin: 0.7 mg/dL (ref 0.2–1.2)
Total Protein: 7.4 g/dL (ref 6.1–8.1)

## 2019-12-23 LAB — TSH: TSH: 1.57 mIU/L (ref 0.40–4.50)

## 2019-12-23 LAB — MAGNESIUM: Magnesium: 2.2 mg/dL (ref 1.5–2.5)

## 2019-12-23 MED ORDER — CHANTIX STARTING MONTH PAK 0.5 MG X 11 & 1 MG X 42 PO TABS: ORAL_TABLET | ORAL | 0 refills | Status: DC

## 2019-12-23 NOTE — Progress Notes (Signed)
Patient is aware of lab results and instructions. -e welch

## 2020-01-01 ENCOUNTER — Ambulatory Visit
Admission: RE | Admit: 2020-01-01 | Discharge: 2020-01-01 | Disposition: A | Discharge status: Home / Self Care | Payer: 59 | Source: Ambulatory Visit | Attending: Adult Health | Admitting: Adult Health

## 2020-01-01 DIAGNOSIS — F17200 Nicotine dependence, unspecified, uncomplicated: Secondary | ICD-10-CM

## 2020-01-01 DIAGNOSIS — Z87891 Personal history of nicotine dependence: Secondary | ICD-10-CM

## 2020-01-01 DIAGNOSIS — Z122 Encounter for screening for malignant neoplasm of respiratory organs: Secondary | ICD-10-CM

## 2020-01-01 DIAGNOSIS — Z720 Tobacco use: Secondary | ICD-10-CM

## 2020-01-02 ENCOUNTER — Encounter: Payer: Self-pay | Admitting: Adult Health

## 2020-01-02 DIAGNOSIS — J439 Emphysema, unspecified: Secondary | ICD-10-CM | POA: Insufficient documentation

## 2020-01-02 DIAGNOSIS — I700 Atherosclerosis of aorta: Secondary | ICD-10-CM | POA: Insufficient documentation

## 2020-03-10 ENCOUNTER — Other Ambulatory Visit: Payer: Self-pay

## 2020-03-10 ENCOUNTER — Ambulatory Visit: Payer: 59 | Admitting: Internal Medicine

## 2020-03-10 ENCOUNTER — Encounter: Payer: Self-pay | Admitting: Internal Medicine

## 2020-03-10 VITALS — BP 136/82 | HR 73 | Temp 97.7°F | Ht 75.5 in | Wt 199.0 lb

## 2020-03-10 DIAGNOSIS — I10 Essential (primary) hypertension: Secondary | ICD-10-CM

## 2020-03-10 DIAGNOSIS — I2510 Atherosclerotic heart disease of native coronary artery without angina pectoris: Secondary | ICD-10-CM

## 2020-03-10 DIAGNOSIS — E782 Mixed hyperlipidemia: Secondary | ICD-10-CM | POA: Diagnosis not present

## 2020-03-10 DIAGNOSIS — R7309 Other abnormal glucose: Secondary | ICD-10-CM

## 2020-03-10 DIAGNOSIS — E559 Vitamin D deficiency, unspecified: Secondary | ICD-10-CM

## 2020-03-10 DIAGNOSIS — Z79899 Other long term (current) drug therapy: Secondary | ICD-10-CM

## 2020-03-10 NOTE — Patient Instructions (Signed)

## 2020-03-10 NOTE — Progress Notes (Signed)
History of Present Illness:       This very nice 59 y.o. DWM presents for 6 month follow up with HTN, HLD, Pre-Diabetes and Vitamin D Deficiency.       Patient is treated for HTN & BP has been controlled at home. Today's BP: (!) 142/80.  In 2017 , he presented with unstable angina and had PCA and and DES for a 90% mid LAD by Dr Marlou Porch. In Aug 2020, Stress Lexiscan was negative.  Patient has had no complaints of any cardiac type chest pain, palpitations, dyspnea / orthopnea / PND, dizziness, claudication, or dependent edema.      Hyperlipidemia is controlled with diet & meds. Patient denies myalgias or other med SE's. Last Lipids were   Lab Results  Component Value Date   CHOL 151 12/22/2019   HDL 79 12/22/2019   LDLCALC 43 12/22/2019   TRIG 236 (H) 12/22/2019   CHOLHDL 1.9 12/22/2019    Also, the patient is followed exppectantly fpr glucose intolerance and has had no symptoms of reactive hypoglycemia, diabetic polys, paresthesias or visual blurring.  Last A1c was   Lab Results  Component Value Date   HGBA1C 5.2 09/09/2019       Further, the patient also has history of Vitamin D Deficiency and supplements vitamin D without any suspected side-effects. Last vitamin D was  Lab Results  Component Value Date   VD25OH 57 09/09/2019    Current Outpatient Medications on File Prior to Visit  Medication Sig  . aspirin EC 81 MG  Take 1 tablet  daily.  Marland Kitchen atenolol  25 MG  TAKE 1 TABLET DAILY   . atorvastatin  40 MG  Take 1/2 tablet Daily   . Cholecalciferol 5000 units  Take 10,000 Units  daily.  Marland Kitchen losartan  100 MG  Take 1  tablet Daily for BP  . NITROSTAT 0.4 MG SL  as needed for chest pain.  . pantoprazole  40 MG  TAKE 1 TABLET DAILY   . zinc gluconate 50 MG  Take 1 tablet  daily.    PMHx:   Past Medical History:  Diagnosis Date  . Coronary artery disease    a. Cath ~ 25 yrs ago, dx w/ pleurisy;  b. 07/2015 Cath/PCI: LM nl, LAD 100m (2.75x24 Synergy DES), LCX nl, RCA nl.  Marland Kitchen  GERD (gastroesophageal reflux disease)   . Hypertensive heart disease   . Tobacco abuse     Immunization History  Administered Date(s) Administered  . PPD Test 05/28/2017, 08/27/2018, 09/09/2019  . Tdap 01/25/2016    Past Surgical History:  Procedure Laterality Date  . BACK SURGERY    . CARDIAC CATHETERIZATION N/A 08/11/2015   Procedure: Left Heart Cath and Coronary Angiography;  Surgeon: Jerline Pain, MD;  Location: [facility];  Service: Cardiovascular;  Laterality: N/A;  . CARDIAC CATHETERIZATION N/A 08/11/2015   Procedure: Coronary Stent Intervention;  Surgeon: Jerline Pain, MD;  Location: [facility];  Service: Cardiovascular;  Laterality: N/A;  . CARDIAC CATHETERIZATION N/A 08/11/2015   Procedure: Coronary Stent Intervention;  Surgeon: Jettie Booze, MD;  Location: [facility];  Service: Cardiovascular;  Laterality: N/A;  . LUMBAR DISC SURGERY      FHx:    Reviewed / unchanged  SHx:    Reviewed / unchanged   Systems Review:  Constitutional: Denies fever, chills, wt changes, headaches, insomnia, fatigue, night sweats, change in appetite. Eyes: Denies redness, blurred  vision, diplopia, discharge, itchy, watery eyes.  ENT: Denies discharge, congestion, post nasal drip, epistaxis, sore throat, earache, hearing loss, dental pain, tinnitus, vertigo, sinus pain, snoring.  CV: Denies chest pain, palpitations, irregular heartbeat, syncope, dyspnea, diaphoresis, orthopnea, PND, claudication or edema. Respiratory: denies cough, dyspnea, DOE, pleurisy, hoarseness, laryngitis, wheezing.  Gastrointestinal: Denies dysphagia, odynophagia, heartburn, reflux, water brash, abdominal pain or cramps, nausea, vomiting, bloating, diarrhea, constipation, hematemesis, melena, hematochezia  or hemorrhoids. Genitourinary: Denies dysuria, frequency, urgency, nocturia, hesitancy, discharge, hematuria or flank pain. Musculoskeletal: Denies arthralgias, myalgias, stiffness, jt.  swelling, pain, limping or strain/sprain.  Skin: Denies pruritus, rash, hives, warts, acne, eczema or change in skin lesion(s). Neuro: No weakness, tremor, incoordination, spasms, paresthesia or pain. Psychiatric: Denies confusion, memory loss or sensory loss. Endo: Denies change in weight, skin or hair change.  Heme/Lymph: No excessive bleeding, bruising or enlarged lymph nodes.  Physical Exam  BP (!) 142/80   Pulse 73   Temp 97.7 F (36.5 C)   Wt 199 lb (90.3 kg)   SpO2 95%   BMI 24.54 kg/m   Appears  well nourished, well groomed  and in no distress.  Eyes: PERRLA, EOMs, conjunctiva no swelling or erythema. Sinuses: No frontal/maxillary tenderness ENT/Mouth: EAC's clear, TM's nl w/o erythema, bulging. Nares clear w/o erythema, swelling, exudates. Oropharynx clear without erythema or exudates. Oral hygiene is good. Tongue normal, non obstructing. Hearing intact.  Neck: Supple. Thyroid not palpable. Car 2+/2+ without bruits, nodes or JVD. Chest: Respirations nl with BS clear & equal w/o rales, rhonchi, wheezing or stridor.  Cor: Heart sounds normal w/ regular rate and rhythm without sig. murmurs, gallops, clicks or rubs. Peripheral pulses normal and equal  without edema.  Abdomen: Soft & bowel sounds normal. Non-tender w/o guarding, rebound, hernias, masses or organomegaly.  Lymphatics: Unremarkable.  Musculoskeletal: Full ROM all peripheral extremities, joint stability, 5/5 strength and normal gait.  Skin: Warm, dry without exposed rashes, lesions or ecchymosis apparent.  Neuro: Cranial nerves intact, reflexes equal bilaterally. Sensory-motor testing grossly intact. Tendon reflexes grossly intact.  Pysch: Alert & oriented x 3.  Insight and judgement nl & appropriate. No ideations.  Assessment and Plan:  1. Essential hypertension  - Continue medication, monitor blood pressure at home.  - Continue DASH diet.  Reminder to go to the ER if any CP,  SOB, nausea, dizziness, severe  HA, changes vision/speech.  - CBC with Differential/Platelet - COMPLETE METABOLIC PANEL WITH GFR - Magnesium - TSH  2. Hyperlipidemia, mixed  - Continue diet/meds, exercise,& lifestyle modifications.  - Continue monitor periodic cholesterol/liver & renal functions   - Lipid panel - TSH  3. Abnormal glucose  - Continue diet, exercise  - Lifestyle modifications.  - Monitor appropriate labs.  - Hemoglobin A1c - VITAMIN D 25 Hydroxy  4. Vitamin D deficiency  - Continue supplementation.  - Insulin, random  5. Coronary artery disease involving native coronary  artery of native heart without angina pectoris  - Lipid panel  6. Medication management  - CBC with Differential/Platelet - COMPLETE METABOLIC PANEL WITH GFR - Magnesium - Lipid panel - TSH - Hemoglobin A1c - VITAMIN D 25 Hydroxy  - Insulin, random        Discussed  regular exercise, BP monitoring, weight control to achieve/maintain BMI less than 25 and discussed med and SE's. Recommended labs to assess and monitor clinical status with further disposition pending results of labs.  I discussed the assessment and treatment plan with the patient. The patient was provided  an opportunity to ask questions and all were answered. The patient agreed with the plan and demonstrated an understanding of the instructions.  I provided over 30 minutes of exam, counseling, chart review and  complex critical decision making.   Kirtland Bouchard, MD

## 2020-03-11 LAB — LIPID PANEL
Cholesterol: 156 mg/dL (ref <200)
HDL: 83 mg/dL (ref >=40)
LDL Cholesterol (Calc): 47 mg/dL (calc)
Non-HDL Cholesterol (Calc): 73 mg/dL (calc) (ref <130)
Total CHOL/HDL Ratio: 1.9 (calc) (ref <5.0)
Triglycerides: 189 mg/dL — ABNORMAL HIGH (ref <150)

## 2020-03-11 LAB — COMPLETE METABOLIC PANEL WITH GFR
AG Ratio: 1.7 (calc) (ref 1.0–2.5)
ALT: 35 U/L (ref 9–46)
AST: 27 U/L (ref 10–35)
Albumin: 4.6 g/dL (ref 3.6–5.1)
Alkaline phosphatase (APISO): 88 U/L (ref 35–144)
BUN: 15 mg/dL (ref 7–25)
CO2: 30 mmol/L (ref 20–32)
Calcium: 9.9 mg/dL (ref 8.6–10.3)
Chloride: 101 mmol/L (ref 98–110)
Creat: 1.19 mg/dL (ref 0.70–1.33)
GFR, Est African American: 78 mL/min/{1.73_m2} (ref >=60)
GFR, Est Non African American: 67 mL/min/{1.73_m2} (ref >=60)
Globulin: 2.7 g/dL (calc) (ref 1.9–3.7)
Glucose, Bld: 93 mg/dL (ref 65–99)
Potassium: 4.3 mmol/L (ref 3.5–5.3)
Sodium: 141 mmol/L (ref 135–146)
Total Bilirubin: 0.9 mg/dL (ref 0.2–1.2)
Total Protein: 7.3 g/dL (ref 6.1–8.1)

## 2020-03-11 LAB — HEMOGLOBIN A1C
Hgb A1c MFr Bld: 5.6 % of total Hgb (ref <5.7)
Mean Plasma Glucose: 114 mg/dL
eAG (mmol/L): 6.3 mmol/L

## 2020-03-11 LAB — CBC WITH DIFFERENTIAL/PLATELET
Absolute Monocytes: 429 cells/uL (ref 200–950)
Basophils Absolute: 72 cells/uL (ref 0–200)
Basophils Relative: 1.1 %
Eosinophils Absolute: 169 cells/uL (ref 15–500)
Eosinophils Relative: 2.6 %
HCT: 45.8 % (ref 38.5–50.0)
Hemoglobin: 15.7 g/dL (ref 13.2–17.1)
Lymphs Abs: 1976 cells/uL (ref 850–3900)
MCH: 32.2 pg (ref 27.0–33.0)
MCHC: 34.3 g/dL (ref 32.0–36.0)
MCV: 93.9 fL (ref 80.0–100.0)
MPV: 11 fL (ref 7.5–12.5)
Monocytes Relative: 6.6 %
Neutro Abs: 3855 cells/uL (ref 1500–7800)
Neutrophils Relative %: 59.3 %
Platelets: 209 10*3/uL (ref 140–400)
RBC: 4.88 10*6/uL (ref 4.20–5.80)
RDW: 12.5 % (ref 11.0–15.0)
Total Lymphocyte: 30.4 %
WBC: 6.5 10*3/uL (ref 3.8–10.8)

## 2020-03-11 LAB — INSULIN, RANDOM: Insulin: 21.5 u[IU]/mL — ABNORMAL HIGH

## 2020-03-11 LAB — VITAMIN D 25 HYDROXY (VIT D DEFICIENCY, FRACTURES): Vit D, 25-Hydroxy: 61 ng/mL (ref 30–100)

## 2020-03-11 LAB — TSH: TSH: 0.54 mIU/L (ref 0.40–4.50)

## 2020-03-11 LAB — MAGNESIUM: Magnesium: 2.1 mg/dL (ref 1.5–2.5)

## 2020-03-11 NOTE — Progress Notes (Signed)
========================================================== ==========================================================  -    Total Chol = 156  and LDL Chol = 47 - Both  Excellent   - Very low risk for Heart Attack  / Stroke ========================================================  - A1c - Normal- Great  -No Diabetes ! ========================================================  - Vitamin D = 61 - Great - Please keep dose same  ========================================================  - All Else - CBC - Kidneys - Electrolytes - Liver - Magnesium & Thyroid    - all  Normal / OK ========================================================   - Keep up the [place] Work  ! ========================================================

## 2020-03-12 ENCOUNTER — Encounter: Payer: Self-pay | Admitting: Internal Medicine

## 2020-03-14 NOTE — Progress Notes (Signed)
PATIENT IS AWARE OF LAB RESULTS. Corey Collier [facility]

## 2020-03-24 ENCOUNTER — Other Ambulatory Visit: Payer: Self-pay | Admitting: Internal Medicine

## 2020-06-08 ENCOUNTER — Ambulatory Visit: Payer: 59 | Admitting: Adult Health Nurse Practitioner

## 2020-07-22 ENCOUNTER — Other Ambulatory Visit: Payer: Self-pay | Admitting: Internal Medicine

## 2020-07-22 DIAGNOSIS — K219 Gastro-esophageal reflux disease without esophagitis: Secondary | ICD-10-CM

## 2020-09-07 ENCOUNTER — Encounter: Payer: Self-pay | Admitting: Internal Medicine

## 2020-09-07 NOTE — Progress Notes (Signed)
Annual  Screening/Preventative Visit  & Comprehensive Evaluation & Examination   Future Appointments  Date Time Provider [place]  09/08/2020 11:00 AM Unk Pinto, MD [facility] None            This very nice 60 y.o. separated WM presents for a Screening /Preventative Visit & comprehensive evaluation and management of multiple medical co-morbidities.  Patient has been followed for HTN, HLD, Prediabetes and Vitamin D Deficiency. Patient has GERD controlled on his meds.            Last Oct, 2021 -  patient had a LD screening Chest CT scan due to his 67 + pack year smoking hx of between 1-1.5  ppd cigarettes. I discussed lung cancer screening with him.   He was agreeable to undergo another screening low dose CT scan of the chest.  We discussed smoking cessation techniques/options. I will refer him her for a LDCT lung scan & discussed  lung cancer screening program.        HTN predates since  2017.  Patient's BP has been controlled at home.  Today's BP: 122/72.  In 2017, patient presented with ACS & had PCA & DES implanted (Dr Marlou Porch).  Patient denies any cardiac symptoms as chest pain, palpitations, shortness of breath, dizziness or ankle swelling.       Patient's hyperlipidemia is controlled with diet and Atorvastatin. Patient denies myalgias or other medication SE's. Last lipids were  at goal:  Lab Results  Component Value Date   CHOL 156 03/10/2020   HDL 83 03/10/2020   LDLCALC 47 03/10/2020   TRIG 189 (H) 03/10/2020   CHOLHDL 1.9 03/10/2020         Patient is monitored expectantly  for glucose intolerance  and patient denies reactive hypoglycemic symptoms, visual blurring, diabetic polys or paresthesias. Last A1c was at goal:   Lab Results  Component Value Date   HGBA1C 5.6 03/10/2020          Finally, patient has history of Vitamin D Deficiency ("36" /2018) and last vitamin D was at goal:   Lab Results  Component Value Date   VD25OH 61 03/10/2020     Current Outpatient Medications on File Prior to Visit  Medication Sig   aspirin EC 81 MG tablet Take  daily.   atenolol 25 MG tablet TAKE 1 TABLET DAILY    atorvastatin  40 MG tablet Take 1/2 tablet Daily l   Cholecalciferol 5000 units  Take 10,000 Units daily.   Losartan  100 MG tablet TAKE 1 TABLET DAILY    NITROSTAT  0.4 MG SL tablet needed for chest pain.   Pantoprazole  40 MG tablet TAKE 1 TABLET DAILY    Zinc 50 MG tablet Take 1 tablet daily.    Past Medical History:  Diagnosis Date   Coronary artery disease    a. Cath ~ 25 yrs ago, dx w/ pleurisy;  b. 07/2015 Cath/PCI: LM nl, LAD 10m (2.75x24 Synergy DES), LCX nl, RCA nl.   GERD (gastroesophageal reflux disease)    Hypertensive heart disease    Tobacco abuse      Health Maintenance  Topic Date Due   COVID-19 Vaccine (1) Never done   Pneumococcal Vaccine 21-33 Years old (1 - PCV) Never done   HIV Screening  Never done   Hepatitis C Screening  Never done   Zoster Vaccines- Shingrix (1 of 2) Never done   COLONOSCOPY  Never done   INFLUENZA VACCINE  10/24/2020  TETANUS/TDAP  01/24/2026   HPV VACCINES  Aged Out     Immunization History  Administered Date(s) Administered   PPD Test 05/28/2017, 08/27/2018, 09/09/2019   Tdap 01/25/2016    Last Colon -    Past Surgical History:  Procedure Laterality Date   BACK SURGERY     CARDIAC CATHETERIZATION N/A 08/11/2015   Procedure: Left Heart Cath and Coronary Angiography;  Surgeon: Jerline Pain, MD;  Location: [facility];  Service: Cardiovascular;  Laterality: N/A;   CARDIAC CATHETERIZATION N/A 08/11/2015   Procedure: Coronary Stent Intervention;  Surgeon: Jerline Pain, MD;  Location: [facility];  Service: Cardiovascular;  Laterality: N/A;   CARDIAC CATHETERIZATION N/A 08/11/2015   Procedure: Coronary Stent Intervention;  Surgeon: Jettie Booze, MD;  Location: [facility];  Service: Cardiovascular;  Laterality: N/A;   LUMBAR DISC SURGERY        Family History  Problem Relation Age of Onset   Stroke Father     Social History   Socioeconomic History   Marital status: Legally Separated    Spouse name:    Number of children: Not on file  Occupational History   Not on file  Tobacco Use   Smoking status: Every Day    Packs/day: 1.00    Years: 42.00    Pack years: 42.00    Types: Cigarettes    Start date: 1979   Smokeless tobacco: Never   Tobacco comments:    Currently 1.5 pack/day   Substance and Sexual Activity   Alcohol use: Yes    Alcohol/week: 42.0 standard drinks    Types: 42 Shots of liquor per week    Comment: 08/11/2015 drinks 3-4 fifths of liquor a week   Drug use: No   Sexual activity: Not on file    ROS Constitutional: Denies fever, chills, weight loss/gain, headaches, insomnia,  night sweats or change in appetite. Does c/o fatigue. Eyes: Denies redness, blurred vision, diplopia, discharge, itchy or watery eyes.  ENT: Denies discharge, congestion, post nasal drip, epistaxis, sore throat, earache, hearing loss, dental pain, Tinnitus, Vertigo, Sinus pain or snoring.  Cardio: Denies chest pain, palpitations, irregular heartbeat, syncope, dyspnea, diaphoresis, orthopnea, PND, claudication or edema Respiratory: denies cough, dyspnea, DOE, pleurisy, hoarseness, laryngitis or wheezing.  Gastrointestinal: Denies dysphagia, heartburn, reflux, water brash, pain, cramps, nausea, vomiting, bloating, diarrhea, constipation, hematemesis, melena, hematochezia, jaundice or hemorrhoids Genitourinary: Denies dysuria, frequency, urgency, nocturia, hesitancy, discharge, hematuria or flank pain Musculoskeletal: Denies arthralgia, myalgia, stiffness, Jt. Swelling, pain, limp or strain/sprain. Denies Falls. Skin: Denies puritis, rash, hives, warts, acne, eczema or change in skin lesion Neuro: No weakness, tremor, incoordination, spasms, paresthesia or pain Psychiatric: Denies confusion, memory loss or sensory loss. Denies  Depression. Endocrine: Denies change in weight, skin, hair change, nocturia, and paresthesia, diabetic polys, visual blurring or hyper / hypo glycemic episodes.  Heme/Lymph: No excessive bleeding, bruising or enlarged lymph nodes.   Physical Exam  BP 122/72   Pulse 65   Temp 97.7 F (36.5 C)   Resp 16   Ht 6\' 3"  (1.905 m)   Wt 189 lb 3.2 oz (85.8 kg)   SpO2 98%   BMI 23.65 kg/m   General Appearance: Well nourished and well groomed and in no apparent distress.  Eyes: PERRLA, EOMs, conjunctiva no swelling or erythema, normal fundi and vessels. Sinuses: No frontal/maxillary tenderness ENT/Mouth: EACs patent / TMs  nl. Nares clear without erythema, swelling, mucoid exudates. Oral hygiene is good. No erythema,  swelling, or exudate. Tongue normal, non-obstructing. Tonsils not swollen or erythematous. Hearing normal.  Neck: Supple, thyroid not palpable. No bruits, nodes or JVD. Respiratory: Respiratory effort normal.  BS equal and clear bilateral without rales, rhonci, wheezing or stridor. Cardio: Heart sounds are normal with regular rate and rhythm and no murmurs, rubs or gallops. Peripheral pulses are normal and equal bilaterally without edema. No aortic or femoral bruits. Chest: symmetric with normal excursions and percussion.  Abdomen: Soft, with Nl bowel sounds. Nontender, no guarding, rebound, hernias, masses, or organomegaly.  Lymphatics: Non tender without lymphadenopathy.  Musculoskeletal: Full ROM all peripheral extremities, joint stability, 5/5 strength, and normal gait. Skin: Warm and dry without rashes, lesions, cyanosis, clubbing or  ecchymosis.  Neuro: Cranial nerves intact, reflexes equal bilaterally. Normal muscle tone, no cerebellar symptoms. Sensation intact.  Pysch: Alert and oriented X 3 with normal affect, insight and judgment appropriate.   Assessment and Plan  1. Annual Preventative/Screening Exam    2. Essential hypertension  - EKG 12-Lead - [place],  RETROPERITNL ABD,  LTD - CBC with Differential/Platelet - COMPLETE METABOLIC PANEL WITH GFR - Magnesium - TSH  3. Hyperlipidemia, mixed  - EKG 12-Lead - [place], RETROPERITNL ABD,  LTD - Lipid panel - TSH  4. Abnormal glucose  - EKG 12-Lead - [place], RETROPERITNL ABD,  LTD - Hemoglobin A1c - Insulin, random  5. Vitamin D deficiency  - VITAMIN D 25 Hydroxy  6. Coronary artery disease involving native coronary  artery of native heart without angina pectoris  - EKG 12-Lead  7. Aortic atherosclerosis ([place]) by CT scan 12/2019  - EKG 12-Lead - [place], RETROPERITNL ABD,  LTD  8. Screening for colorectal cancer  - POC Hemoccult Bld/Stl   9. Testosterone deficiency  - Testosterone - TSH  10. Encounter for screening for lung cancer   11. Screening for prostate cancer  - PSA  12. Screening for ischemic heart disease  - EKG 12-Lead  13. FH: cardiovascular disease  - EKG 12-Lead - [place], RETROPERITNL ABD,  LTD  14. Smoker  - EKG 12-Lead - [place], RETROPERITNL ABD,  LTD  15. Screening for AAA (aortic abdominal aneurysm)  - [place], RETROPERITNL ABD,  LTD  16. Fatigue, unspecified type  - Iron, Total/Total Iron Binding Cap - Vitamin B12  17. Medication management  - Urinalysis, Routine w reflex microscopic - Microalbumin / creatinine urine ratio - CBC with Differential/Platelet - COMPLETE METABOLIC PANEL WITH GFR - Magnesium - Lipid panel - TSH - Hemoglobin A1c - Insulin, random - VITAMIN D 25 Hydroxy  18. Screening-pulmonary TB  - TB Skin Test          Patient was counseled in prudent diet, weight control to achieve/maintain BMI less than 25, BP monitoring, regular exercise and medications as discussed.  Discussed med effects and SE's. Routine screening labs and tests as requested with regular follow-up as recommended. Over 40 minutes of exam, counseling, chart review and high complex critical decision making was performed   Kirtland Bouchard, MD

## 2020-09-07 NOTE — Patient Instructions (Signed)

## 2020-09-08 ENCOUNTER — Encounter: Payer: Self-pay | Admitting: Internal Medicine

## 2020-09-08 ENCOUNTER — Other Ambulatory Visit: Payer: Self-pay

## 2020-09-08 ENCOUNTER — Ambulatory Visit: Payer: 59 | Admitting: Internal Medicine

## 2020-09-08 VITALS — BP 122/72 | HR 65 | Temp 97.7°F | Resp 16 | Ht 75.0 in | Wt 189.2 lb

## 2020-09-08 DIAGNOSIS — Z122 Encounter for screening for malignant neoplasm of respiratory organs: Secondary | ICD-10-CM

## 2020-09-08 DIAGNOSIS — Z125 Encounter for screening for malignant neoplasm of prostate: Secondary | ICD-10-CM

## 2020-09-08 DIAGNOSIS — E782 Mixed hyperlipidemia: Secondary | ICD-10-CM

## 2020-09-08 DIAGNOSIS — Z8249 Family history of ischemic heart disease and other diseases of the circulatory system: Secondary | ICD-10-CM | POA: Diagnosis not present

## 2020-09-08 DIAGNOSIS — I10 Essential (primary) hypertension: Secondary | ICD-10-CM

## 2020-09-08 DIAGNOSIS — Z0001 Encounter for general adult medical examination with abnormal findings: Secondary | ICD-10-CM

## 2020-09-08 DIAGNOSIS — I2510 Atherosclerotic heart disease of native coronary artery without angina pectoris: Secondary | ICD-10-CM

## 2020-09-08 DIAGNOSIS — E559 Vitamin D deficiency, unspecified: Secondary | ICD-10-CM

## 2020-09-08 DIAGNOSIS — Z0000 Encounter for general adult medical examination without abnormal findings: Secondary | ICD-10-CM

## 2020-09-08 DIAGNOSIS — R7309 Other abnormal glucose: Secondary | ICD-10-CM

## 2020-09-08 DIAGNOSIS — Z111 Encounter for screening for respiratory tuberculosis: Secondary | ICD-10-CM | POA: Diagnosis not present

## 2020-09-08 DIAGNOSIS — I700 Atherosclerosis of aorta: Secondary | ICD-10-CM

## 2020-09-08 DIAGNOSIS — Z136 Encounter for screening for cardiovascular disorders: Secondary | ICD-10-CM | POA: Diagnosis not present

## 2020-09-08 DIAGNOSIS — F17200 Nicotine dependence, unspecified, uncomplicated: Secondary | ICD-10-CM

## 2020-09-08 DIAGNOSIS — E349 Endocrine disorder, unspecified: Secondary | ICD-10-CM

## 2020-09-08 DIAGNOSIS — R5383 Other fatigue: Secondary | ICD-10-CM

## 2020-09-08 DIAGNOSIS — Z79899 Other long term (current) drug therapy: Secondary | ICD-10-CM

## 2020-09-08 DIAGNOSIS — Z1211 Encounter for screening for malignant neoplasm of colon: Secondary | ICD-10-CM

## 2020-09-08 NOTE — Progress Notes (Addendum)
New Patient Office Visit  Subjective:  Patient ID: Corey Collier, male    DOB: March 10, 1961  Age: 60 y.o. MRN: [id number]  CC:  Chief Complaint  Patient presents with   Annual Exam    Pt states everything health wise is pretty good.    HPI Corey Collier presents for Physical  Past Medical History:  Diagnosis Date   Coronary artery disease    a. Cath ~ 25 yrs ago, dx w/ pleurisy;  b. 07/2015 Cath/PCI: LM nl, LAD 60m (2.75x24 Synergy DES), LCX nl, RCA nl.   GERD (gastroesophageal reflux disease)    Hypertensive heart disease    Tobacco abuse     Past Surgical History:  Procedure Laterality Date   BACK SURGERY     CARDIAC CATHETERIZATION N/A 08/11/2015   Procedure: Left Heart Cath and Coronary Angiography;  Surgeon: Jerline Pain, MD;  Location: [facility];  Service: Cardiovascular;  Laterality: N/A;   CARDIAC CATHETERIZATION N/A 08/11/2015   Procedure: Coronary Stent Intervention;  Surgeon: Jerline Pain, MD;  Location: [facility];  Service: Cardiovascular;  Laterality: N/A;   CARDIAC CATHETERIZATION N/A 08/11/2015   Procedure: Coronary Stent Intervention;  Surgeon: Jettie Booze, MD;  Location: [facility];  Service: Cardiovascular;  Laterality: N/A;   LUMBAR DISC SURGERY      Family History  Problem Relation Age of Onset   Stroke Father     Social History   Socioeconomic History   Marital status: Legally Separated    Spouse name: Not on file   Number of children: Not on file   Years of education: Not on file   Highest education level: Not on file  Occupational History   Not on file  Tobacco Use   Smoking status: Every Day    Packs/day: 1.00    Years: 42.00    Pack years: 42.00    Types: Cigarettes    Start date: 1979   Smokeless tobacco: Never   Tobacco comments:    Currently 1.5 pack/day   Substance and Sexual Activity   Alcohol use: Yes    Alcohol/week: 42.0 standard drinks    Types: 42 Shots of liquor per week     Comment: 08/11/2015 drinks 3-4 fifths of liquor a week   Drug use: No   Sexual activity: Not on file  Other Topics Concern   Not on file  Social History Narrative   Not on file   Social Determinants of Health   Financial Resource Strain: Not on file  Food Insecurity: Not on file  Transportation Needs: Not on file  Physical Activity: Not on file  Stress: Not on file  Social Connections: Not on file  Intimate Partner Violence: Not on file    ROS Review of Systems  Objective:   Today's Vitals: BP 122/72   Pulse 65   Temp 97.7 F (36.5 C)   Resp 16   Ht 6\' 3"  (1.905 m)   Wt 189 lb 3.2 oz (85.8 kg)   SpO2 98%   BMI 23.65 kg/m   Physical Exam  Assessment & Plan:   Problem List Items Addressed This Visit       Cardiovascular and Mediastinum   Coronary artery disease   Relevant Orders   EKG 12-Lead   Aortic atherosclerosis ([place]) by CT scan 12/2019   Relevant Orders   EKG 12-Lead   [place], RETROPERITNL ABD,  LTD     Other   Hyperlipidemia, mixed  Relevant Orders   EKG 12-Lead   [place], RETROPERITNL ABD,  LTD   Lipid panel   TSH   Vitamin D deficiency   Relevant Orders   VITAMIN D 25 Hydroxy (Vit-D Deficiency, Fractures)   Abnormal glucose   Relevant Orders   EKG 12-Lead   [place], RETROPERITNL ABD,  LTD   Hemoglobin A1c   Insulin, random   Other Visit Diagnoses     Encounter for general adult medical examination with abnormal findings    -  Primary   Essential hypertension       Relevant Orders   EKG 12-Lead   [place], RETROPERITNL ABD,  LTD   CBC with Differential/Platelet   COMPLETE METABOLIC PANEL WITH GFR   Magnesium   TSH   Screening for colorectal cancer       Relevant Orders   POC Hemoccult Bld/Stl (3-Cd Home Screen)   Testosterone deficiency       Relevant Orders   Testosterone   TSH   Encounter for screening for lung cancer       Screening for prostate cancer       Relevant Orders   PSA   Screening for ischemic heart disease       Relevant  Orders   EKG 12-Lead   FH: cardiovascular disease       Relevant Orders   EKG 12-Lead   [place], RETROPERITNL ABD,  LTD   Smoker       Relevant Orders   EKG 12-Lead   [place], RETROPERITNL ABD,  LTD   Screening for AAA (aortic abdominal aneurysm)       Relevant Orders   [place], RETROPERITNL ABD,  LTD   Fatigue, unspecified type       Relevant Orders   Iron, Total/Total Iron Binding Cap   Vitamin B12   Medication management       Relevant Orders   Urinalysis, Routine w reflex microscopic   Microalbumin / creatinine urine ratio   CBC with Differential/Platelet   COMPLETE METABOLIC PANEL WITH GFR   Magnesium   Lipid panel   TSH   Hemoglobin A1c   Insulin, random   VITAMIN D 25 Hydroxy (Vit-D Deficiency, Fractures)   Screening-pulmonary TB       Relevant Orders   TB Skin Test       Outpatient Encounter Medications as of 09/08/2020  Medication Sig   aspirin EC 81 MG tablet Take 1 tablet (81 mg total) by mouth daily.   atenolol (TENORMIN) 25 MG tablet TAKE 1 TABLET DAILY FOR BLOOD PRESSURE   atorvastatin (LIPITOR) 40 MG tablet Take 1/2 tablet Daily for Cholesterol   Cholecalciferol 5000 units CHEW Chew 10,000 Units by mouth daily.   losartan (COZAAR) 100 MG tablet TAKE 1 TABLET DAILY FOR BLOOD PRESSURE   nitroGLYCERIN (NITROSTAT) 0.4 MG SL tablet Place 1 tablet (0.4 mg total) under the tongue every 5 (five) minutes as needed for chest pain.   pantoprazole (PROTONIX) 40 MG tablet TAKE 1 TABLET DAILY FOR ACID INDIGESTION & REFLUX   zinc gluconate 50 MG tablet Take 1 tablet (50 mg total) by mouth daily.   No facility-administered encounter medications on file as of 09/08/2020.    Follow-up: Return for 6 mo PA OV & 1 yr CPE McK.   Judith Blonder, [place]

## 2020-09-09 LAB — COMPLETE METABOLIC PANEL WITH GFR
AG Ratio: 1.6 (calc) (ref 1.0–2.5)
ALT: 32 U/L (ref 9–46)
AST: 24 U/L (ref 10–35)
Albumin: 4.4 g/dL (ref 3.6–5.1)
Alkaline phosphatase (APISO): 74 U/L (ref 35–144)
BUN: 12 mg/dL (ref 7–25)
CO2: 28 mmol/L (ref 20–32)
Calcium: 9.3 mg/dL (ref 8.6–10.3)
Chloride: 106 mmol/L (ref 98–110)
Creat: 1.02 mg/dL (ref 0.70–1.33)
GFR, Est African American: 93 mL/min/{1.73_m2} (ref >=60)
GFR, Est Non African American: 80 mL/min/{1.73_m2} (ref >=60)
Globulin: 2.7 g/dL (calc) (ref 1.9–3.7)
Glucose, Bld: 120 mg/dL — ABNORMAL HIGH (ref 65–99)
Potassium: 3.9 mmol/L (ref 3.5–5.3)
Sodium: 143 mmol/L (ref 135–146)
Total Bilirubin: 0.6 mg/dL (ref 0.2–1.2)
Total Protein: 7.1 g/dL (ref 6.1–8.1)

## 2020-09-09 LAB — LIPID PANEL
Cholesterol: 138 mg/dL (ref <200)
HDL: 80 mg/dL (ref >=40)
LDL Cholesterol (Calc): 24 mg/dL (calc)
Non-HDL Cholesterol (Calc): 58 mg/dL (calc) (ref <130)
Total CHOL/HDL Ratio: 1.7 (calc) (ref <5.0)
Triglycerides: 291 mg/dL — ABNORMAL HIGH (ref <150)

## 2020-09-09 LAB — HEMOGLOBIN A1C
Hgb A1c MFr Bld: 5.3 % of total Hgb (ref <5.7)
Mean Plasma Glucose: 105 mg/dL
eAG (mmol/L): 5.8 mmol/L

## 2020-09-09 LAB — IRON, TOTAL/TOTAL IRON BINDING CAP
%SAT: 38 % (calc) (ref 20–48)
Iron: 119 ug/dL (ref 50–180)
TIBC: 310 mcg/dL (calc) (ref 250–425)

## 2020-09-09 LAB — PSA: PSA: 0.68 ng/mL (ref <=4.00)

## 2020-09-09 LAB — CBC WITH DIFFERENTIAL/PLATELET
Absolute Monocytes: 312 cells/uL (ref 200–950)
Basophils Absolute: 48 cells/uL (ref 0–200)
Basophils Relative: 1 %
Eosinophils Absolute: 168 cells/uL (ref 15–500)
Eosinophils Relative: 3.5 %
HCT: 48.3 % (ref 38.5–50.0)
Hemoglobin: 15.9 g/dL (ref 13.2–17.1)
Lymphs Abs: 1963 cells/uL (ref 850–3900)
MCH: 31.2 pg (ref 27.0–33.0)
MCHC: 32.9 g/dL (ref 32.0–36.0)
MCV: 94.9 fL (ref 80.0–100.0)
MPV: 11.4 fL (ref 7.5–12.5)
Monocytes Relative: 6.5 %
Neutro Abs: 2309 cells/uL (ref 1500–7800)
Neutrophils Relative %: 48.1 %
Platelets: 197 10*3/uL (ref 140–400)
RBC: 5.09 10*6/uL (ref 4.20–5.80)
RDW: 12.6 % (ref 11.0–15.0)
Total Lymphocyte: 40.9 %
WBC: 4.8 10*3/uL (ref 3.8–10.8)

## 2020-09-09 LAB — URINALYSIS, ROUTINE W REFLEX MICROSCOPIC
Bilirubin Urine: NEGATIVE
Glucose, UA: NEGATIVE
Hgb urine dipstick: NEGATIVE
Ketones, ur: NEGATIVE
Leukocytes,Ua: NEGATIVE
Nitrite: NEGATIVE
Protein, ur: NEGATIVE
Specific Gravity, Urine: 1.022 (ref 1.001–1.035)
pH: <=5 (ref 5.0–8.0)

## 2020-09-09 LAB — INSULIN, RANDOM: Insulin: 19.4 u[IU]/mL

## 2020-09-09 LAB — MICROALBUMIN / CREATININE URINE RATIO
Creatinine, Urine: 192 mg/dL (ref 20–320)
Microalb Creat Ratio: 3 mcg/mg creat (ref <30)
Microalb, Ur: 0.5 mg/dL

## 2020-09-09 LAB — VITAMIN D 25 HYDROXY (VIT D DEFICIENCY, FRACTURES): Vit D, 25-Hydroxy: 47 ng/mL (ref 30–100)

## 2020-09-09 LAB — VITAMIN B12: Vitamin B-12: 328 pg/mL (ref 200–1100)

## 2020-09-09 LAB — MAGNESIUM: Magnesium: 2.1 mg/dL (ref 1.5–2.5)

## 2020-09-09 LAB — TSH: TSH: 0.83 mIU/L (ref 0.40–4.50)

## 2020-09-09 LAB — TESTOSTERONE: Testosterone: 185 ng/dL — ABNORMAL LOW (ref 250–827)

## 2020-09-10 ENCOUNTER — Encounter: Payer: Self-pay | Admitting: Internal Medicine

## 2020-09-10 NOTE — Progress Notes (Signed)
============================================================ ============================================================ -  PSA - Normal - Great  ============================================================ ============================================================  -  Testosterone is a little low - Please don't forget your Zinc 50 mg as                                                   Zinc helps raise Testosterone levels naturally  - Also  Daily exercise  - at least 15 minutes 2 x day can                                                                           help raise Testosterone levels  ============================================================ ============================================================  -  Iron levels are Normal & OK   \ -  Vitamin B12 =      328      Very Low  (Ideal or Goal Vit B12 is between 450 - 1,100)   Low Vit B12 may be associated with Anemia, Fatigue,   Peripheral Neuropathy, Dementia, "Brain Fog", & Depression  - Recommend take a sub-lingual form of Vitamin B12 tablet   1,000 to 5,000 mcg tab that you dissolve under your tongue /Daily   - Can get Baron Sane - best price at [employer] or on [employer] ============================================================ ============================================================  -  Total Chol  = 138   and LDL Chol = 24 -   Both    Excellent   - Very low risk for Heart Attack  / Stroke ========================================================  - But Triglycerides (   291   ) or fats in blood are too high             (  goal is less than 150  )    - Recommend avoid fried & greasy foods,  sweets / candy,   - Avoid white rice  (brown or wild rice or Quinoa is OK),   - Avoid white potatoes  (sweet potatoes are OK)   - Avoid anything made from white flour  - bagels, doughnuts, rolls, buns, biscuits, white and   wheat breads, pizza crust and traditional  pasta made of white flour & egg  white  - (vegetarian pasta or spinach or wheat pasta is OK).    - Multi-grain bread is OK - like multi-grain flat bread or  sandwich thins.   - Avoid alcohol in excess.   - Exercise is also important. ============================================================ ============================================================  -  A1c   Normal - Great - No Diabetes ============================================================ ============================================================  -  Vitamin = 47 - much lower  (was 61 )   - And - Vitamin D goal is between 70-100.   - Please make sure that you are taking your                                                           Vitamin D 10,000 units /day  as directed.   - It is  very important as a natural anti-inflammatory and helping the                   immune system protect against viral infections, like the Covid-19    - It helps hair, skin, and nails, as well as reducing stroke and heart attack risk.   - It helps your bones and helps with mood.  - It also decreases numerous cancer risks so please  take it as directed.   - Low Vit D is associated with a 200-300% higher risk for  CANCER   and 200-300% higher risk for HEART   ATTACK  &  STROKE.    - It is also associated with higher death rate at younger ages,   autoimmune diseases like Rheumatoid arthritis, Lupus,  Multiple Sclerosis.     - Also many other serious conditions, like depression,                                             Alzheimer's Dementia,                                                  infertility, muscle aches, fatigue, fibromyalgia   - just to name a few. ============================================================ ============================================================  -  All Else - CBC - Kidneys - Electrolytes - Liver - Magnesium & Thyroid    - all  Normal /  OK ============================================================ ============================================================

## 2020-09-12 NOTE — Progress Notes (Signed)
Pt was called to discuss his lab results. Pt states he  taken 2,000 X2 tab a day for a year and he doesn't understand  why his B12 level is so low. Pt states he is going to try the brand the doctor recommends. DG/[facility]

## 2020-09-27 ENCOUNTER — Ambulatory Visit: Payer: 59

## 2020-10-05 ENCOUNTER — Other Ambulatory Visit: Payer: Self-pay | Admitting: Internal Medicine

## 2020-10-19 ENCOUNTER — Other Ambulatory Visit: Payer: Self-pay | Admitting: Internal Medicine

## 2020-10-19 MED ORDER — ATENOLOL 100 MG PO TABS: ORAL_TABLET | ORAL | 1 refills | Status: DC

## 2020-10-19 MED ORDER — ISOSORBIDE MONONITRATE ER 60 MG PO TB24: ORAL_TABLET | ORAL | 1 refills | Status: DC

## 2021-01-10 ENCOUNTER — Ambulatory Visit
Admission: RE | Admit: 2021-01-10 | Discharge: 2021-01-10 | Disposition: A | Discharge status: Home / Self Care | Payer: 59 | Source: Ambulatory Visit | Attending: Internal Medicine | Admitting: Internal Medicine

## 2021-01-10 DIAGNOSIS — Z122 Encounter for screening for malignant neoplasm of respiratory organs: Secondary | ICD-10-CM

## 2021-01-21 ENCOUNTER — Other Ambulatory Visit: Payer: Self-pay | Admitting: Internal Medicine

## 2021-01-21 DIAGNOSIS — E782 Mixed hyperlipidemia: Secondary | ICD-10-CM

## 2021-03-07 NOTE — Progress Notes (Deleted)
FOLLOW UP  Assessment and Plan:   CAD Control blood pressure, cholesterol, glucose, increase exercise low risk stress myoview in 10/2018, denies angina Stop smoking Followed by cardiology  Emphysema by CT 01/10/21 CT for lung cancer screen completed this year 01/10/21 due 1 year  Hypertension Continue medications; fairly controlled Advised to avoid smoking right prior to OV Monitor blood pressure at home; patient to call if consistently greater than 130/80 Continue DASH diet.   Reminder to go to the ER if any CP, SOB, nausea, dizziness, severe HA, changes vision/speech, left arm numbness and tingling and jaw pain.  Cholesterol At goal; continue medication Continue low cholesterol diet and exercise.  Check lipid panel.   Tobacco use Discussed risks associated with tobacco use and advised to reduce or quit Patient is ready to do so and plans start tapering Prescription for chantix with information provided Will follow up at the next visit Monitor by imaging annually at CPE, Low dose CT discussed today, info provided, patient is interested and agreeable to schedule if insurance will cover  GERD Symptoms well managed without breakthrough Failed trial to taper off of PPI Cut back on ETOH  Vitamin D Def He has increased supplement and taking 10000 IU daily; near goal at last check Defer vit D  Hypogonadism He declines to initiate testosterone injections  He would prefer to work on lifestyle; fatigue has improved some Suggested initiate zinc 50 mg daily,   Continue diet and meds as discussed. Further disposition pending results of labs. Discussed med's effects and SE's.   Over 30 minutes of exam, counseling, chart review, and critical decision making was performed.   Future Appointments  Date Time Provider [place]  03/10/2021  9:30 AM Magda Bernheim, NP [facility] None  09/08/2021 11:00 AM Unk Pinto, MD [facility] None     ----------------------------------------------------------------------------------------------------------------------  HPI 60 y.o. male  presents for 3 month follow up on hypertension, continued tobacco use and cholesterol;   he currently continues to smoke 1.5 pack a day, for 40+ years ; discussed risks associated with smoking, patient is ready to quit. Interested in chantix if can afford. No recent imaging, Low dose CT scan discussed, information provided to review last time, agreeable to schedule.   He admits to drinking 5-6 drinks "shots of hard liquor", has reduced from 8 per day since last visit, drinks once he gets home from work, no urge to drink while working, hands don't shake. Will continue to slow taper.   he has a diagnosis of GERD which is currently managed by protonix 40 mg daily, failed attempt to taper with H2i (high dose ranitidine)  he reports symptoms is currently well controlled, and denies breakthrough reflux, burning in chest, hoarseness or cough.    BMI is There is no height or weight on file to calculate BMI., he has not been working on diet and exercise, works in physically active job 3-4 hours a day.  Wt Readings from Last 3 Encounters:  09/08/20 189 lb 3.2 oz (85.8 kg)  03/10/20 199 lb (90.3 kg)  12/22/19 195 lb 12.8 oz (88.8 kg)   He is s/p mid LAD 90% lesion stented in mid May 2017, followed by Dr. Marlou Porch.  Underwent stress myoview in 10/2018 which showed low risk study; patient denies episodes of angina  His blood pressure has not been controlled at home, today their BP is    He does workout. He denies chest pain, shortness of breath, dizziness.   He is on cholesterol  medication (atorvastatin 20 mg daily) and denies myalgias. His cholesterol is at goal. The cholesterol last visit was:   Lab Results  Component Value Date   CHOL 138 09/08/2020   HDL 80 09/08/2020   LDLCALC 24 09/08/2020   TRIG 291 (H) 09/08/2020   CHOLHDL 1.7 09/08/2020   Last A1C  in the office was:  Lab Results  Component Value Date   HGBA1C 5.3 09/08/2020   Patient is on Vitamin D supplement, now taking 10000 IU daily  Lab Results  Component Value Date   VD25OH 47 09/08/2020     He has a history of testosterone deficiency, does endorse some low energy. Exercising less than he used to. States he no longer wants to do testosterone supplement. He has not tried zinc supplementation, agreeable to try.  Lab Results  Component Value Date   TESTOSTERONE 185 (L) 09/08/2020    Current Medications:  Current Outpatient Medications on File Prior to Visit  Medication Sig   aspirin EC 81 MG tablet Take 1 tablet (81 mg total) by mouth daily.   atenolol (TENORMIN) 100 MG tablet TAKE 1 TABLET DAILY FOR BLOOD PRESSURE   atorvastatin (LIPITOR) 40 MG tablet Take  1 tablet  Daily  for Cholesterol                          /           TAKE 1 TABLET BY MOUTH   Cholecalciferol 5000 units CHEW Chew 10,000 Units by mouth daily.   isosorbide mononitrate (IMDUR) 60 MG 24 hr tablet Take  1/2 to 1 tablet  Daily  for BP   losartan (COZAAR) 100 MG tablet TAKE 1 TABLET DAILY FOR BLOOD PRESSURE   nitroGLYCERIN (NITROSTAT) 0.4 MG SL tablet Place 1 tablet (0.4 mg total) under the tongue every 5 (five) minutes as needed for chest pain.   pantoprazole (PROTONIX) 40 MG tablet TAKE 1 TABLET DAILY FOR ACID INDIGESTION & REFLUX   zinc gluconate 50 MG tablet Take 1 tablet (50 mg total) by mouth daily.   No current facility-administered medications on file prior to visit.     Allergies: No Known Allergies   Medical History:  Past Medical History:  Diagnosis Date   Coronary artery disease    a. Cath ~ 25 yrs ago, dx w/ pleurisy;  b. 07/2015 Cath/PCI: LM nl, LAD 34m (2.75x24 Synergy DES), LCX nl, RCA nl.   GERD (gastroesophageal reflux disease)    Hypertensive heart disease    Tobacco abuse    Family history- Reviewed and unchanged Social history- Reviewed and unchanged   Review of Systems:   Review of Systems  Constitutional:  Negative for malaise/fatigue and weight loss.  HENT:  Negative for hearing loss and tinnitus.   Eyes:  Negative for blurred vision and double vision.  Respiratory:  Negative for cough, shortness of breath and wheezing.   Cardiovascular:  Negative for chest pain, palpitations, orthopnea, claudication and leg swelling.  Gastrointestinal:  Negative for abdominal pain, blood in stool, constipation, diarrhea, heartburn, melena, nausea and vomiting.  Genitourinary: Negative.   Musculoskeletal:  Negative for joint pain and myalgias.  Skin:  Negative for rash.  Neurological:  Negative for dizziness, tingling, sensory change, weakness and headaches.  Endo/Heme/Allergies:  Negative for polydipsia.  Psychiatric/Behavioral: Negative.    All other systems reviewed and are negative.  Physical Exam: There were no vitals taken for this visit. Wt Readings from Last 3 Encounters:  09/08/20 189 lb 3.2 oz (85.8 kg)  03/10/20 199 lb (90.3 kg)  12/22/19 195 lb 12.8 oz (88.8 kg)   General Appearance: Well nourished, in no apparent distress. Eyes: PERRLA, EOMs, conjunctiva no swelling or erythema Sinuses: No Frontal/maxillary tenderness ENT/Mouth: Ext aud canals clear, TMs without erythema, bulging. No erythema, swelling, or exudate on post pharynx.  Tonsils not swollen or erythematous. Hearing normal.  Neck: Supple, thyroid normal.  Respiratory: Respiratory effort normal, BS equal bilaterally without rales, rhonchi, wheezing or stridor.  Cardio: RRR with no MRGs. Brisk peripheral pulses without edema.  Abdomen: Soft, + BS.  Non tender, no guarding, rebound, hernias, masses. Lymphatics: Non tender without lymphadenopathy.  Musculoskeletal: Full ROM, 5/5 strength, Normal gait Skin: Warm, dry without rashes, lesions, ecchymosis.  Neuro: Cranial nerves intact. No cerebellar symptoms.  Psych: Awake and oriented X 3, normal affect, Insight and Judgment appropriate.     Magda Bernheim, NP 3:41 PM [facility]

## 2021-03-10 ENCOUNTER — Ambulatory Visit: Payer: 59 | Admitting: Nurse Practitioner

## 2021-03-10 DIAGNOSIS — I10 Essential (primary) hypertension: Secondary | ICD-10-CM

## 2021-03-10 DIAGNOSIS — R7309 Other abnormal glucose: Secondary | ICD-10-CM

## 2021-03-10 DIAGNOSIS — K219 Gastro-esophageal reflux disease without esophagitis: Secondary | ICD-10-CM

## 2021-03-10 DIAGNOSIS — Z720 Tobacco use: Secondary | ICD-10-CM

## 2021-03-10 DIAGNOSIS — E559 Vitamin D deficiency, unspecified: Secondary | ICD-10-CM

## 2021-03-10 DIAGNOSIS — Z79899 Other long term (current) drug therapy: Secondary | ICD-10-CM

## 2021-03-10 DIAGNOSIS — I2510 Atherosclerotic heart disease of native coronary artery without angina pectoris: Secondary | ICD-10-CM

## 2021-03-10 DIAGNOSIS — J439 Emphysema, unspecified: Secondary | ICD-10-CM

## 2021-03-10 DIAGNOSIS — E291 Testicular hypofunction: Secondary | ICD-10-CM

## 2021-03-10 DIAGNOSIS — I700 Atherosclerosis of aorta: Secondary | ICD-10-CM

## 2021-03-10 DIAGNOSIS — E782 Mixed hyperlipidemia: Secondary | ICD-10-CM

## 2021-04-10 NOTE — Progress Notes (Signed)
FOLLOW UP  Assessment and Plan:   Aortic Atherosclerosis by CT scan 12/2019 Control blood pressure, cholesterol, glucose, increase exercise  Pulmonary Emphysema Strongly encourage to taper and quit smoking- not ready Monitor symptoms- currently asymptomatic  CAD Control blood pressure, cholesterol, glucose, increase exercise low risk stress myoview in 10/2018, denies angina Stop smoking Followed by cardiology  Hypertension Not currently controlled Reviewed medication and added HCTZ 25mg  QD Advised to avoid smoking right prior to OV Monitor blood pressure at home; patient to call if consistently greater than 130/80 Continue DASH diet.   Reminder to go to the ER if any CP, SOB, nausea, dizziness, severe HA, changes vision/speech, left arm numbness and tingling and jaw pain. Follow up in 1 month for reevaluation- bring BP log to visit Check CBC  Cholesterol At goal; continue medication Continue low cholesterol diet and exercise.  Check lipid panel.  Check CMP  Tobacco use Discussed risks associated with tobacco use and advised to reduce or quit Patient is not ready to do so Prescription for chantix with information provided Will follow up at the next visit Monitor by Low dose CT annually  Abnormal Glucose Continue diet and exercise Check A1c  GERD Symptoms well managed without breakthrough Failed trial to taper off of PPI Cut back on ETOH  Vitamin D Def He has increased supplement and taking 10000 IU daily; near goal at last check Defer vit D  Excessive drinking of alcohol Strongly encouraged to taper and quit- counseled on dangers Pt is not ready to stop   Hypogonadism He declines to initiate testosterone injections  He would prefer to work on lifestyle; fatigue has improved some Suggested initiate zinc 50 mg daily,  Check testosterone  Medication Management Continued   Continue diet and meds as discussed. Further disposition pending results of labs.  Discussed med's effects and SE's.   Over 30 minutes of exam, counseling, chart review, and critical decision making was performed.   Future Appointments  Date Time Provider [place]  09/08/2021 11:00 AM Unk Pinto, MD [facility] None    ----------------------------------------------------------------------------------------------------------------------  HPI 61 y.o. male  presents for 3 month follow up on hypertension, continued tobacco use and cholesterol;   he currently continues to smoke 1.5 pack a day, for 40+ years ; discussed risks associated with smoking, patient is ready to quit. Low dose CT scan discussed performed 01/10/21- emphysema  He admits to drinking 8 drinks "shots of hard liquor", drinks once he gets home from work, no urge to drink while working, hands don't shake. He does not feel ready to quit yet.  he has a diagnosis of GERD which is currently managed by protonix 40 mg daily, failed attempt to taper with H2i (high dose ranitidine)  he reports symptoms is currently well controlled, and denies breakthrough reflux, burning in chest, hoarseness or cough.    BMI is Body mass index is 23.87 kg/m., he has not been working on diet and exercise, works in physically active job 3-4 hours a day.  Wt Readings from Last 3 Encounters:  04/12/21 191 lb (86.6 kg)  09/08/20 189 lb 3.2 oz (85.8 kg)  03/10/20 199 lb (90.3 kg)   He is s/p mid LAD 90% lesion stented in mid May 2017, followed by Dr. Marlou Porch.  Underwent stress myoview in 10/2018 which showed low risk study; patient denies episodes of angina, has not had to take nitroglycerin.   His blood pressure has not been controlled at home running 150's/80's, today their BP is BP: Marland Kitchen)  154/88 Tried Imdur but could not tolerate caused dizziness. He will occasionally forget to take Losartan at night. BP Readings from Last 3 Encounters:  04/12/21 (!) 154/88  09/08/20 122/72  03/10/20 136/82     He does workout. He  denies chest pain, shortness of breath, dizziness.   He is on cholesterol medication (atorvastatin 20 mg daily) and denies myalgias. His cholesterol is at goal. The cholesterol last visit was:   Lab Results  Component Value Date   CHOL 138 09/08/2020   HDL 80 09/08/2020   LDLCALC 24 09/08/2020   TRIG 291 (H) 09/08/2020   CHOLHDL 1.7 09/08/2020   Last A1C in the office was:  Lab Results  Component Value Date   HGBA1C 5.3 09/08/2020   Patient is on Vitamin D supplement, now taking 10000 IU daily  Lab Results  Component Value Date   VD25OH 47 09/08/2020     He has a history of testosterone deficiency, does endorse some low energy. Exercising less than he used to. States he no longer wants to do testosterone supplement. He has not tried zinc supplementation, agreeable to try.  Lab Results  Component Value Date   TESTOSTERONE 185 (L) 09/08/2020    Current Medications:  Current Outpatient Medications on File Prior to Visit  Medication Sig   aspirin EC 81 MG tablet Take 1 tablet (81 mg total) by mouth daily.   atenolol (TENORMIN) 100 MG tablet TAKE 1 TABLET DAILY FOR BLOOD PRESSURE   atorvastatin (LIPITOR) 40 MG tablet Take  1 tablet  Daily  for Cholesterol                          /           TAKE 1 TABLET BY MOUTH   Cholecalciferol 5000 units CHEW Chew 10,000 Units by mouth daily.   losartan (COZAAR) 100 MG tablet TAKE 1 TABLET DAILY FOR BLOOD PRESSURE   nitroGLYCERIN (NITROSTAT) 0.4 MG SL tablet Place 1 tablet (0.4 mg total) under the tongue every 5 (five) minutes as needed for chest pain.   pantoprazole (PROTONIX) 40 MG tablet TAKE 1 TABLET DAILY FOR ACID INDIGESTION & REFLUX   zinc gluconate 50 MG tablet Take 1 tablet (50 mg total) by mouth daily.   isosorbide mononitrate (IMDUR) 60 MG 24 hr tablet Take  1/2 to 1 tablet  Daily  for BP (Patient not taking: Reported on 04/12/2021)   No current facility-administered medications on file prior to visit.     Allergies: No Known  Allergies   Medical History:  Past Medical History:  Diagnosis Date   Coronary artery disease    a. Cath ~ 25 yrs ago, dx w/ pleurisy;  b. 07/2015 Cath/PCI: LM nl, LAD 43m (2.75x24 Synergy DES), LCX nl, RCA nl.   GERD (gastroesophageal reflux disease)    Hypertensive heart disease    Tobacco abuse    Family history- Reviewed and unchanged Social history- Reviewed and unchanged   Review of Systems:  Review of Systems  Constitutional:  Negative for malaise/fatigue and weight loss.  HENT:  Negative for hearing loss and tinnitus.   Eyes:  Negative for blurred vision and double vision.  Respiratory:  Negative for cough, shortness of breath and wheezing.   Cardiovascular:  Negative for chest pain, palpitations, orthopnea, claudication and leg swelling.  Gastrointestinal:  Negative for abdominal pain, blood in stool, constipation, diarrhea, heartburn, melena, nausea and vomiting.  Genitourinary: Negative.   Musculoskeletal:  Negative for joint pain and myalgias.  Skin:  Negative for rash.  Neurological:  Negative for dizziness, tingling, sensory change, weakness and headaches.  Endo/Heme/Allergies:  Negative for polydipsia.  Psychiatric/Behavioral: Negative.    All other systems reviewed and are negative.  Physical Exam: BP (!) 154/88    Pulse 66    Temp (!) 97.5 F (36.4 C)    Wt 191 lb (86.6 kg)    SpO2 99%    BMI 23.87 kg/m  Wt Readings from Last 3 Encounters:  04/12/21 191 lb (86.6 kg)  09/08/20 189 lb 3.2 oz (85.8 kg)  03/10/20 199 lb (90.3 kg)   General Appearance: Well nourished, in no apparent distress. Eyes: PERRLA, EOMs, conjunctiva no swelling or erythema Sinuses: No Frontal/maxillary tenderness ENT/Mouth: Ext aud canals clear, TMs without erythema, bulging. No erythema, swelling, or exudate on post pharynx.  Tonsils not swollen or erythematous. Hearing normal.  Neck: Supple, thyroid normal.  Respiratory: Respiratory effort normal, BS equal bilaterally without rales,  rhonchi, wheezing or stridor.  Cardio: RRR with no MRGs. Brisk peripheral pulses without edema.  Abdomen: Soft, + BS.  Non tender, no guarding, rebound, hernias, masses. Lymphatics: Non tender without lymphadenopathy.  Musculoskeletal: Full ROM, 5/5 strength, Normal gait Skin: Warm, dry without rashes, lesions, ecchymosis.  Neuro: Cranial nerves intact. No cerebellar symptoms.  Psych: Awake and oriented X 3, normal affect, Insight and Judgment appropriate.    Magda Bernheim, NP 9:49 AM [facility]

## 2021-04-12 ENCOUNTER — Ambulatory Visit: Payer: 59 | Admitting: Nurse Practitioner

## 2021-04-12 ENCOUNTER — Other Ambulatory Visit: Payer: Self-pay

## 2021-04-12 ENCOUNTER — Encounter: Payer: Self-pay | Admitting: Nurse Practitioner

## 2021-04-12 VITALS — BP 154/88 | HR 66 | Temp 97.5°F | Wt 191.0 lb

## 2021-04-12 DIAGNOSIS — E291 Testicular hypofunction: Secondary | ICD-10-CM

## 2021-04-12 DIAGNOSIS — I700 Atherosclerosis of aorta: Secondary | ICD-10-CM

## 2021-04-12 DIAGNOSIS — J439 Emphysema, unspecified: Secondary | ICD-10-CM | POA: Diagnosis not present

## 2021-04-12 DIAGNOSIS — F1010 Alcohol abuse, uncomplicated: Secondary | ICD-10-CM

## 2021-04-12 DIAGNOSIS — E782 Mixed hyperlipidemia: Secondary | ICD-10-CM

## 2021-04-12 DIAGNOSIS — Z720 Tobacco use: Secondary | ICD-10-CM

## 2021-04-12 DIAGNOSIS — I10 Essential (primary) hypertension: Secondary | ICD-10-CM

## 2021-04-12 DIAGNOSIS — Z79899 Other long term (current) drug therapy: Secondary | ICD-10-CM

## 2021-04-12 DIAGNOSIS — R7309 Other abnormal glucose: Secondary | ICD-10-CM

## 2021-04-12 DIAGNOSIS — E559 Vitamin D deficiency, unspecified: Secondary | ICD-10-CM

## 2021-04-12 DIAGNOSIS — I2510 Atherosclerotic heart disease of native coronary artery without angina pectoris: Secondary | ICD-10-CM

## 2021-04-12 DIAGNOSIS — K219 Gastro-esophageal reflux disease without esophagitis: Secondary | ICD-10-CM

## 2021-04-12 MED ORDER — HYDROCHLOROTHIAZIDE 25 MG PO TABS: 25.0000 mg | ORAL_TABLET | Freq: Every day | ORAL | 3 refills | Status: DC

## 2021-04-12 NOTE — Patient Instructions (Signed)

## 2021-04-13 LAB — CBC WITH DIFFERENTIAL/PLATELET
Absolute Monocytes: 389 cells/uL (ref 200–950)
Basophils Absolute: 47 cells/uL (ref 0–200)
Basophils Relative: 0.7 %
Eosinophils Absolute: 121 cells/uL (ref 15–500)
Eosinophils Relative: 1.8 %
HCT: 45 % (ref 38.5–50.0)
Hemoglobin: 15 g/dL (ref 13.2–17.1)
Lymphs Abs: 1635 cells/uL (ref 850–3900)
MCH: 32.4 pg (ref 27.0–33.0)
MCHC: 33.3 g/dL (ref 32.0–36.0)
MCV: 97.2 fL (ref 80.0–100.0)
MPV: 10.6 fL (ref 7.5–12.5)
Monocytes Relative: 5.8 %
Neutro Abs: 4509 cells/uL (ref 1500–7800)
Neutrophils Relative %: 67.3 %
Platelets: 175 10*3/uL (ref 140–400)
RBC: 4.63 10*6/uL (ref 4.20–5.80)
RDW: 12.7 % (ref 11.0–15.0)
Total Lymphocyte: 24.4 %
WBC: 6.7 10*3/uL (ref 3.8–10.8)

## 2021-04-13 LAB — COMPLETE METABOLIC PANEL WITH GFR
AG Ratio: 1.5 (calc) (ref 1.0–2.5)
ALT: 37 U/L (ref 9–46)
AST: 33 U/L (ref 10–35)
Albumin: 4.3 g/dL (ref 3.6–5.1)
Alkaline phosphatase (APISO): 85 U/L (ref 35–144)
BUN: 10 mg/dL (ref 7–25)
CO2: 34 mmol/L — ABNORMAL HIGH (ref 20–32)
Calcium: 9.6 mg/dL (ref 8.6–10.3)
Chloride: 103 mmol/L (ref 98–110)
Creat: 1.1 mg/dL (ref 0.70–1.30)
Globulin: 2.8 g/dL (calc) (ref 1.9–3.7)
Glucose, Bld: 114 mg/dL — ABNORMAL HIGH (ref 65–99)
Potassium: 3.9 mmol/L (ref 3.5–5.3)
Sodium: 140 mmol/L (ref 135–146)
Total Bilirubin: 0.8 mg/dL (ref 0.2–1.2)
Total Protein: 7.1 g/dL (ref 6.1–8.1)
eGFR: 77 mL/min/{1.73_m2} (ref >=60)

## 2021-04-13 LAB — LIPID PANEL
Cholesterol: 173 mg/dL (ref <200)
HDL: 104 mg/dL (ref >=40)
LDL Cholesterol (Calc): 52 mg/dL (calc)
Non-HDL Cholesterol (Calc): 69 mg/dL (calc) (ref <130)
Total CHOL/HDL Ratio: 1.7 (calc) (ref <5.0)
Triglycerides: 86 mg/dL (ref <150)

## 2021-04-13 LAB — HEMOGLOBIN A1C
Hgb A1c MFr Bld: 5.2 % of total Hgb (ref <5.7)
Mean Plasma Glucose: 103 mg/dL
eAG (mmol/L): 5.7 mmol/L

## 2021-04-13 LAB — TESTOSTERONE: Testosterone: 354 ng/dL (ref 250–827)

## 2021-04-17 ENCOUNTER — Other Ambulatory Visit: Payer: Self-pay | Admitting: Adult Health Nurse Practitioner

## 2021-05-08 NOTE — Progress Notes (Unsigned)
Assessment and Plan:  There are no diagnoses linked to this encounter.    Further disposition pending results of labs. Discussed med's effects and SE's.   Over 30 minutes of exam, counseling, chart review, and critical decision making was performed.   Future Appointments  Date Time Provider [place]  05/10/2021 10:30 AM Magda Bernheim, NP [facility] None  09/08/2021 11:00 AM Unk Pinto, MD [facility] None    ------------------------------------------------------------------------------------------------------------------   HPI There were no vitals taken for this visit. 61 y.o.male presents for  Past Medical History:  Diagnosis Date   Coronary artery disease    a. Cath ~ 25 yrs ago, dx w/ pleurisy;  b. 07/2015 Cath/PCI: LM nl, LAD 65m (2.75x24 Synergy DES), LCX nl, RCA nl.   GERD (gastroesophageal reflux disease)    Hypertensive heart disease    Tobacco abuse      No Known Allergies  Current Outpatient Medications on File Prior to Visit  Medication Sig   aspirin EC 81 MG tablet Take 1 tablet (81 mg total) by mouth daily.   atenolol (TENORMIN) 100 MG tablet TAKE 1 TABLET DAILY FOR BLOOD PRESSURE   atorvastatin (LIPITOR) 40 MG tablet Take  1 tablet  Daily  for Cholesterol                          /           TAKE 1 TABLET BY MOUTH   Cholecalciferol 5000 units CHEW Chew 10,000 Units by mouth daily.   hydrochlorothiazide (HYDRODIURIL) 25 MG tablet Take 1 tablet (25 mg total) by mouth daily.   losartan (COZAAR) 100 MG tablet Take  1 tablet  Daily for BP                                                                                 /                          TAKE  BY MOUTH   nitroGLYCERIN (NITROSTAT) 0.4 MG SL tablet Place 1 tablet (0.4 mg total) under the tongue every 5 (five) minutes as needed for chest pain.   pantoprazole (PROTONIX) 40 MG tablet TAKE 1 TABLET DAILY FOR ACID INDIGESTION & REFLUX   zinc gluconate 50 MG tablet Take 1 tablet (50 mg total) by mouth  daily.   No current facility-administered medications on file prior to visit.    ROS: all negative except above.   Physical Exam:  There were no vitals taken for this visit.  General Appearance: Well nourished, in no apparent distress. Eyes: PERRLA, EOMs, conjunctiva no swelling or erythema Sinuses: No Frontal/maxillary tenderness ENT/Mouth: Ext aud canals clear, TMs without erythema, bulging. No erythema, swelling, or exudate on post pharynx.  Tonsils not swollen or erythematous. Hearing normal.  Neck: Supple, thyroid normal.  Respiratory: Respiratory effort normal, BS equal bilaterally without rales, rhonchi, wheezing or stridor.  Cardio: RRR with no MRGs. Brisk peripheral pulses without edema.  Abdomen: Soft, + BS.  Non tender, no guarding, rebound, hernias, masses. Lymphatics: Non tender without lymphadenopathy.  Musculoskeletal: Full ROM, 5/5 strength, normal gait.  Skin: Warm, dry without rashes, lesions, ecchymosis.  Neuro: Cranial nerves intact. Normal muscle tone, no cerebellar symptoms. Sensation intact.  Psych: Awake and oriented X 3, normal affect, Insight and Judgment appropriate.     Magda Bernheim, NP 1:12 PM [facility]

## 2021-05-10 ENCOUNTER — Ambulatory Visit: Payer: 59 | Admitting: Nurse Practitioner

## 2021-07-13 ENCOUNTER — Other Ambulatory Visit: Payer: Self-pay | Admitting: Internal Medicine

## 2021-08-16 ENCOUNTER — Other Ambulatory Visit: Payer: Self-pay | Admitting: Adult Health

## 2021-08-16 DIAGNOSIS — K219 Gastro-esophageal reflux disease without esophagitis: Secondary | ICD-10-CM

## 2021-09-07 NOTE — Progress Notes (Unsigned)
Annual  Screening/Preventative Visit  & Comprehensive Evaluation & Examination   Future Appointments  Date Time Provider [place]  09/08/2020 11:00 AM Unk Pinto, MD [facility] None            This very nice 61 y.o. separated WM presents for a Screening /Preventative Visit & comprehensive evaluation and management of multiple medical co-morbidities.  Patient has been followed for HTN, HLD, Prediabetes and Vitamin D Deficiency. Patient has GERD controlled on his meds.            Last Oct, 2021 -  patient had a LD screening Chest CT scan due to his 55 + pack year smoking hx of between 1-1.5  ppd cigarettes. I discussed lung cancer screening with him.   He was agreeable to undergo another screening low dose CT scan of the chest.  We discussed smoking cessation techniques/options. I will refer him her for a LDCT lung scan & discussed  lung cancer screening program.        HTN predates since  2017.  Patient's BP has been controlled at home.  Today's BP is not at goal - 180/110.  In 2017, patient presented with ACS & had PCA & DES implanted (Dr Marlou Porch).  Patient denies any cardiac symptoms as chest pain, palpitations, shortness of breath, dizziness or ankle swelling.       Patient's hyperlipidemia is controlled with diet and Atorvastatin. Patient denies myalgias or other medication SE's. Last lipids were  at goal:  Lab Results  Component Value Date   CHOL 156 03/10/2020   HDL 83 03/10/2020   LDLCALC 47 03/10/2020   TRIG 189 (H) 03/10/2020   CHOLHDL 1.9 03/10/2020         Patient is monitored expectantly  for glucose intolerance  and patient denies reactive hypoglycemic symptoms, visual blurring, diabetic polys or paresthesias. Last A1c was at goal:   Lab Results  Component Value Date   HGBA1C 5.6 03/10/2020          Finally, patient has history of Vitamin D Deficiency ("36" /2018) and last vitamin D was at goal:   Lab Results  Component Value Date   VD25OH  61 03/10/2020    Current Outpatient Medications on File Prior to Visit  Medication Sig   aspirin EC 81 MG tablet Take  daily.   atenolol 25 MG tablet TAKE 1 TABLET DAILY    atorvastatin  40 MG tablet Take 1/2 tablet Daily    Cholecalciferol 5000 units  Take 10,000 Units daily.   Losartan  100 MG tablet TAKE 1 TABLET DAILY    NITROSTAT  0.4 MG SL tablet needed for chest pain.   Pantoprazole  40 MG tablet TAKE 1 TABLET DAILY    Zinc 50 MG tablet Take 1 tablet daily.    Past Medical History:  Diagnosis Date   Coronary artery disease    a. Cath ~ 25 yrs ago, dx w/ pleurisy;  b. 07/2015 Cath/PCI: LM nl, LAD 30m(2.75x24 Synergy DES), LCX nl, RCA nl.   GERD (gastroesophageal reflux disease)    Hypertensive heart disease    Tobacco abuse      Health Maintenance  Topic Date Due   COVID-19 Vaccine (1) Never done   Pneumococcal Vaccine [id number] old (1 - PCV) Never done   HIV Screening  Never done   Hepatitis C Screening  Never done   Zoster Vaccines- Shingrix (1 of 2) Never done   COLONOSCOPY  Never done  INFLUENZA VACCINE  10/24/2020   TETANUS/TDAP  01/24/2026   HPV VACCINES  Aged Out     Immunization History  Administered Date(s) Administered   PPD Test 05/28/2017, 08/27/2018, 09/09/2019   Tdap 01/25/2016    Cologard  - 09/24/2019  - Negative - 3 year f/u due July 2024   Past Surgical History:  Procedure Laterality Date   BACK SURGERY     CARDIAC CATHETERIZATION N/A 08/11/2015   Procedure: Left Heart Cath and Coronary Angiography;  Surgeon: Jerline Pain, MD;  Location: [facility];  Service: Cardiovascular;  Laterality: N/A;   CARDIAC CATHETERIZATION N/A 08/11/2015   Procedure: Coronary Stent Intervention;  Surgeon: Jerline Pain, MD;  Location: [facility];  Service: Cardiovascular;  Laterality: N/A;   CARDIAC CATHETERIZATION N/A 08/11/2015   Procedure: Coronary Stent Intervention;  Surgeon: Jettie Booze, MD;  Location: [facility];   Service: Cardiovascular;  Laterality: N/A;   LUMBAR DISC SURGERY       Family History  Problem Relation Age of Onset   Stroke Father     Social History   Socioeconomic History   Marital status: Legally Separated    Spouse name:    Number of children: Not on file  Occupational History   Not on file  Tobacco Use   Smoking status: Every Day    Packs/day: 1.00    Years: 42.00    Pack years: 42.00    Types: Cigarettes    Start date: 1979   Smokeless tobacco: Never   Tobacco comments:    Currently 1.5 pack/day   Substance and Sexual Activity   Alcohol use: Yes    Alcohol/week: 42.0 standard drinks    Types: 42 Shots of liquor per week    Comment: 08/11/2015 drinks 3-4 fifths of liquor a week   Drug use: No   Sexual activity: Not on file    ROS Constitutional: Denies fever, chills, weight loss/gain, headaches, insomnia,  night sweats or change in appetite. Does c/o fatigue. Eyes: Denies redness, blurred vision, diplopia, discharge, itchy or watery eyes.  ENT: Denies discharge, congestion, post nasal drip, epistaxis, sore throat, earache, hearing loss, dental pain, Tinnitus, Vertigo, Sinus pain or snoring.  Cardio: Denies chest pain, palpitations, irregular heartbeat, syncope, dyspnea, diaphoresis, orthopnea, PND, claudication or edema Respiratory: denies cough, dyspnea, DOE, pleurisy, hoarseness, laryngitis or wheezing.  Gastrointestinal: Denies dysphagia, heartburn, reflux, water brash, pain, cramps, nausea, vomiting, bloating, diarrhea, constipation, hematemesis, melena, hematochezia, jaundice or hemorrhoids Genitourinary: Denies dysuria, frequency, urgency, nocturia, hesitancy, discharge, hematuria or flank pain Musculoskeletal: Denies arthralgia, myalgia, stiffness, Jt. Swelling, pain, limp or strain/sprain. Denies Falls. Skin: Denies puritis, rash, hives, warts, acne, eczema or change in skin lesion Neuro: No weakness, tremor, incoordination, spasms, paresthesia or  pain Psychiatric: Denies confusion, memory loss or sensory loss. Denies Depression. Endocrine: Denies change in weight, skin, hair change, nocturia, and paresthesia, diabetic polys, visual blurring or hyper / hypo glycemic episodes.  Heme/Lymph: No excessive bleeding, bruising or enlarged lymph nodes.   Physical Exam  BP (!) 180/110   Pulse (!) 58   Temp 97.9 F (36.6 C)   Resp 16   Ht '6\' 3"'$  (1.905 m)   Wt 192 lb (87.1 kg)   SpO2 97%   BMI 24.00 kg/m   General Appearance: Well nourished and well groomed and in no apparent distress.  Eyes: PERRLA, EOMs, conjunctiva no swelling or erythema, normal fundi and vessels. Sinuses: No frontal/maxillary tenderness ENT/Mouth: EACs patent /  TMs  nl. Nares clear without erythema, swelling, mucoid exudates. Oral hygiene is good. No erythema, swelling, or exudate. Tongue normal, non-obstructing. Tonsils not swollen or erythematous. Hearing normal.  Neck: Supple, thyroid not palpable. No bruits, nodes or JVD. Respiratory: Respiratory effort normal.  BS equal and clear bilateral without rales, rhonci, wheezing or stridor. Cardio: Heart sounds are normal with regular rate and rhythm and no murmurs, rubs or gallops. Peripheral pulses are normal and equal bilaterally without edema. No aortic or femoral bruits. Chest: symmetric with normal excursions and percussion.  Abdomen: Soft, with Nl bowel sounds. Nontender, no guarding, rebound, hernias, masses, or organomegaly.  Lymphatics: Non tender without lymphadenopathy.  Musculoskeletal: Full ROM all peripheral extremities, joint stability, 5/5 strength, and normal gait. Skin: Warm and dry without rashes, lesions, cyanosis, clubbing or  ecchymosis.  Neuro: Cranial nerves intact, reflexes equal bilaterally. Normal muscle tone, no cerebellar symptoms. Sensation intact.  Pysch: Alert and oriented X 3 with normal affect, insight and judgment appropriate.   Assessment and Plan  1. Annual  Preventative/Screening Exam    2. Essential hypertension  - New Rx Amlodipine 10 mg  # 90 x 3 RF  Take 1 tablet qhs   - Monitor BPs & call if >145/90  - EKG 12-Lead - [place], RETROPERITNL ABD,  LTD - CBC with Differential/Platelet - COMPLETE METABOLIC PANEL WITH GFR - Magnesium - TSH  3. Hyperlipidemia, mixed  - EKG 12-Lead - [place], RETROPERITNL ABD,  LTD - Lipid panel - TSH  4. Abnormal glucose  - EKG 12-Lead - [place], RETROPERITNL ABD,  LTD - Hemoglobin A1c - Insulin, random  5. Vitamin D deficiency  - VITAMIN D 25 Hydroxy  6. Coronary artery disease involving native coronary  artery of native heart without angina pectoris  - EKG 12-Lead  7. Aortic atherosclerosis ([place]) by CT scan 12/2019  - EKG 12-Lead - [place], RETROPERITNL ABD,  LTD  8. Screening for colorectal cancer  - POC Hemoccult Bld/Stl   9. Testosterone deficiency  - Testosterone - TSH  10. Encounter for screening for lung cancer   11. Screening for prostate cancer  - PSA  12. Screening for ischemic heart disease  - EKG 12-Lead  13. FH: cardiovascular disease  - EKG 12-Lead - [place], RETROPERITNL ABD,  LTD  14. Smoker  - EKG 12-Lead - [place], RETROPERITNL ABD,  LTD  15. Screening for AAA (aortic abdominal aneurysm)  - [place], RETROPERITNL ABD,  LTD  16. Fatigue, unspecified type  - Iron, Total/Total Iron Binding Cap - Vitamin B12  17. Medication management  - Urinalysis, Routine w reflex microscopic - Microalbumin / creatinine urine ratio - CBC with Differential/Platelet - COMPLETE METABOLIC PANEL WITH GFR - Magnesium - Lipid panel - TSH - Hemoglobin A1c - Insulin, random - VITAMIN D 25 Hydroxy  18. Screening-pulmonary TB  - TB Skin Test          Patient was counseled in prudent diet, weight control to achieve/maintain BMI less than 25, BP monitoring, regular exercise and medications as discussed.  Discussed med effects and SE's. Routine screening labs and tests as requested with  regular follow-up as recommended. Over 40 minutes of exam, counseling, chart review and high complex critical decision making was performed   Kirtland Bouchard, MD

## 2021-09-08 ENCOUNTER — Encounter: Payer: Self-pay | Admitting: Internal Medicine

## 2021-09-08 ENCOUNTER — Ambulatory Visit: Payer: 59 | Admitting: Internal Medicine

## 2021-09-08 VITALS — BP 180/110 | HR 58 | Temp 97.9°F | Resp 16 | Ht 75.0 in | Wt 192.0 lb

## 2021-09-08 DIAGNOSIS — Z136 Encounter for screening for cardiovascular disorders: Secondary | ICD-10-CM

## 2021-09-08 DIAGNOSIS — R7309 Other abnormal glucose: Secondary | ICD-10-CM

## 2021-09-08 DIAGNOSIS — R5383 Other fatigue: Secondary | ICD-10-CM

## 2021-09-08 DIAGNOSIS — Z125 Encounter for screening for malignant neoplasm of prostate: Secondary | ICD-10-CM

## 2021-09-08 DIAGNOSIS — I10 Essential (primary) hypertension: Secondary | ICD-10-CM

## 2021-09-08 DIAGNOSIS — E559 Vitamin D deficiency, unspecified: Secondary | ICD-10-CM

## 2021-09-08 DIAGNOSIS — Z111 Encounter for screening for respiratory tuberculosis: Secondary | ICD-10-CM | POA: Diagnosis not present

## 2021-09-08 DIAGNOSIS — Z1211 Encounter for screening for malignant neoplasm of colon: Secondary | ICD-10-CM

## 2021-09-08 DIAGNOSIS — E782 Mixed hyperlipidemia: Secondary | ICD-10-CM

## 2021-09-08 DIAGNOSIS — I700 Atherosclerosis of aorta: Secondary | ICD-10-CM

## 2021-09-08 DIAGNOSIS — Z79899 Other long term (current) drug therapy: Secondary | ICD-10-CM

## 2021-09-08 DIAGNOSIS — F17200 Nicotine dependence, unspecified, uncomplicated: Secondary | ICD-10-CM

## 2021-09-08 DIAGNOSIS — Z87891 Personal history of nicotine dependence: Secondary | ICD-10-CM

## 2021-09-08 DIAGNOSIS — Z0000 Encounter for general adult medical examination without abnormal findings: Secondary | ICD-10-CM | POA: Diagnosis not present

## 2021-09-08 DIAGNOSIS — I2510 Atherosclerotic heart disease of native coronary artery without angina pectoris: Secondary | ICD-10-CM

## 2021-09-08 DIAGNOSIS — Z122 Encounter for screening for malignant neoplasm of respiratory organs: Secondary | ICD-10-CM

## 2021-09-08 DIAGNOSIS — E349 Endocrine disorder, unspecified: Secondary | ICD-10-CM

## 2021-09-08 DIAGNOSIS — Z8249 Family history of ischemic heart disease and other diseases of the circulatory system: Secondary | ICD-10-CM

## 2021-09-08 MED ORDER — AMLODIPINE BESYLATE 10 MG PO TABS: ORAL_TABLET | ORAL | 3 refills | Status: DC

## 2021-09-08 MED ORDER — ATENOLOL 100 MG PO TABS: ORAL_TABLET | ORAL | 3 refills | Status: DC

## 2021-09-08 NOTE — Patient Instructions (Signed)

## 2021-09-11 LAB — LIPID PANEL
Cholesterol: 162 mg/dL (ref <200)
HDL: 84 mg/dL (ref >=40)
Non-HDL Cholesterol (Calc): 78 mg/dL (calc) (ref <130)
Total CHOL/HDL Ratio: 1.9 (calc) (ref <5.0)
Triglycerides: 446 mg/dL — ABNORMAL HIGH (ref <150)

## 2021-09-11 LAB — CBC WITH DIFFERENTIAL/PLATELET
Absolute Monocytes: 370 cells/uL (ref 200–950)
Basophils Absolute: 73 cells/uL (ref 0–200)
Basophils Relative: 1.3 %
Eosinophils Absolute: 118 cells/uL (ref 15–500)
Eosinophils Relative: 2.1 %
HCT: 46.7 % (ref 38.5–50.0)
Hemoglobin: 15.5 g/dL (ref 13.2–17.1)
Lymphs Abs: 2218 cells/uL (ref 850–3900)
MCH: 32.3 pg (ref 27.0–33.0)
MCHC: 33.2 g/dL (ref 32.0–36.0)
MCV: 97.3 fL (ref 80.0–100.0)
MPV: 11.3 fL (ref 7.5–12.5)
Monocytes Relative: 6.6 %
Neutro Abs: 2822 cells/uL (ref 1500–7800)
Neutrophils Relative %: 50.4 %
Platelets: 205 10*3/uL (ref 140–400)
RBC: 4.8 10*6/uL (ref 4.20–5.80)
RDW: 13 % (ref 11.0–15.0)
Total Lymphocyte: 39.6 %
WBC: 5.6 10*3/uL (ref 3.8–10.8)

## 2021-09-11 LAB — URINALYSIS, ROUTINE W REFLEX MICROSCOPIC
Bilirubin Urine: NEGATIVE
Glucose, UA: NEGATIVE
Hgb urine dipstick: NEGATIVE
Ketones, ur: NEGATIVE
Leukocytes,Ua: NEGATIVE
Nitrite: NEGATIVE
Protein, ur: NEGATIVE
Specific Gravity, Urine: 1.02 (ref 1.001–1.035)
pH: 6 (ref 5.0–8.0)

## 2021-09-11 LAB — COMPLETE METABOLIC PANEL WITH GFR
AG Ratio: 1.7 (calc) (ref 1.0–2.5)
ALT: 33 U/L (ref 9–46)
AST: 31 U/L (ref 10–35)
Albumin: 4.5 g/dL (ref 3.6–5.1)
Alkaline phosphatase (APISO): 89 U/L (ref 35–144)
BUN: 14 mg/dL (ref 7–25)
CO2: 28 mmol/L (ref 20–32)
Calcium: 9.5 mg/dL (ref 8.6–10.3)
Chloride: 104 mmol/L (ref 98–110)
Creat: 1.17 mg/dL (ref 0.70–1.35)
Globulin: 2.7 g/dL (calc) (ref 1.9–3.7)
Glucose, Bld: 86 mg/dL (ref 65–99)
Potassium: 4.3 mmol/L (ref 3.5–5.3)
Sodium: 142 mmol/L (ref 135–146)
Total Bilirubin: 0.3 mg/dL (ref 0.2–1.2)
Total Protein: 7.2 g/dL (ref 6.1–8.1)
eGFR: 71 mL/min/{1.73_m2} (ref >=60)

## 2021-09-11 LAB — MICROALBUMIN / CREATININE URINE RATIO
Creatinine, Urine: 140 mg/dL (ref 20–320)
Microalb Creat Ratio: 4 mcg/mg creat (ref <30)
Microalb, Ur: 0.6 mg/dL

## 2021-09-11 LAB — HEMOGLOBIN A1C
Hgb A1c MFr Bld: 5.1 % of total Hgb (ref <5.7)
Mean Plasma Glucose: 100 mg/dL
eAG (mmol/L): 5.5 mmol/L

## 2021-09-11 LAB — INSULIN, RANDOM: Insulin: 10.4 u[IU]/mL

## 2021-09-11 LAB — TSH: TSH: 0.67 mIU/L (ref 0.40–4.50)

## 2021-09-11 LAB — IRON, TOTAL/TOTAL IRON BINDING CAP
%SAT: 27 % (calc) (ref 20–48)
Iron: 91 ug/dL (ref 50–180)
TIBC: 331 mcg/dL (calc) (ref 250–425)

## 2021-09-11 LAB — VITAMIN B12: Vitamin B-12: 268 pg/mL (ref 200–1100)

## 2021-09-11 LAB — TESTOSTERONE: Testosterone: 223 ng/dL — ABNORMAL LOW (ref 250–827)

## 2021-09-11 LAB — MAGNESIUM: Magnesium: 2 mg/dL (ref 1.5–2.5)

## 2021-09-11 LAB — PSA: PSA: 0.46 ng/mL (ref <=4.00)

## 2021-09-11 LAB — VITAMIN D 25 HYDROXY (VIT D DEFICIENCY, FRACTURES): Vit D, 25-Hydroxy: 63 ng/mL (ref 30–100)

## 2021-10-04 NOTE — Patient Instructions (Signed)

## 2021-10-04 NOTE — Progress Notes (Signed)
Future Appointments  Date Time Provider Department  10/05/2021 11:30 AM Unk Pinto, MD [facility]  10/10/2021  2:30 PM Unk Pinto, MD [facility]  09/13/2022 11:00 AM Unk Pinto, MD [facility]    History of Present Illness:     Patient is a very nice 61 y.o. sep WM with HTN, HLD, Prediabetes,  GERD and Vitamin D Deficiency. Who was seen 1 month ago with elevated BP 180/110 and was added Amlodipine 10 qhs with his Losartan 100 mg & Atenolol 100 mg  x 1/2 tab . Today's BP is 136/98.  Patient's BP is still 180/110 & it's suspected that he's not taking all 3 meds on schedule . Denies any HAs, dizziness, CP, palpitations, dyspnea or edema.  Medications   Current Outpatient Medications (Cardiovascular):    amLODipine (NORVASC) 10 MG tablet, Take  1 tablet  Daily  for BP   atenolol (TENORMIN) 100 MG tablet, Takes  1/2 tablet  Daily for BP   atorvastatin (LIPITOR) 40 MG tablet, Take  1 tablet  Daily  for Cholesterol                          /           TAKE 1 TABLET BY MOUTH   losartan (COZAAR) 100 MG tablet, Take  1 tablet  Daily for BP                                                                                 /                          TAKE  BY MOUTH   nitroGLYCERIN (NITROSTAT) 0.4 MG SL tablet, Place 1 tablet (0.4 mg total) under the tongue every 5 (five) minutes as needed for chest pain.   hydrochlorothiazide (HYDRODIURIL) 25 MG tablet, Take 1 tablet (25 mg total) by mouth daily. (Patient not taking: Reported on 09/08/2021)   Current Outpatient Medications (Analgesics):    aspirin EC 81 MG tablet, Take 1 tablet (81 mg total) by mouth daily.   Current Outpatient Medications (Other):    Cholecalciferol 5000 units CHEW, Chew 10,000 Units by mouth daily.   pantoprazole (PROTONIX) 40 MG tablet, TAKE 1 TABLET DAILY FOR ACID INDIGESTION & REFLUX   zinc gluconate 50 MG tablet, Take 1 tablet (50 mg total) by mouth daily.  Problem list He has Hypertensive heart disease;  Tobacco abuse; GERD (gastroesophageal reflux disease); Coronary artery disease; HTN (hypertension); Hyperlipidemia, mixed; Vitamin D deficiency; History of smoking 30 or more pack years (42); Abnormal glucose; BPH with obstruction/lower urinary tract symptoms; Excessive drinking of alcohol; Aortic atherosclerosis ([place]) by CT scan 12/2019; and Emphysema lung ([place]) on their problem list.   Observations/Objective:  BP (!) 136/98   Pulse (!) 47   Temp (!) 96.6 F (35.9 C)   Resp 16   Ht '6\' 3"'$  (1.905 m)   Wt 189 lb (85.7 kg)   SpO2 99%   BMI 23.62 kg/m   HEENT - WNL. Neck - supple.  Chest - Clear equal BS. Cor - Nl HS. RRR w/o sig MGR.  PP 1(+). No edema. MS- FROM w/o deformities.  Gait Nl. Neuro -  Nl w/o focal abnormalities.   Assessment and Plan:  1. Essential hypertension  - Advised to change all 3 meds :                                                       Amlodipine 10 mg,                                                        Atenolol 100 mg &                                                        Losartan 100 mg                                         to take 1/2 tab of each bid at [place] &                                        continue home monitoring 2 x /day   - ROV   with list of BPs  in   3 weeks to recheck   Follow Up Instructions:       I discussed the assessment and treatment plan with the patient. The patient was provided an opportunity to ask questions and all were answered. The patient agreed with the plan and demonstrated an understanding of the instructions.       The patient was advised to call back or seek an in-person evaluation if the symptoms worsen or if the condition fails to improve as anticipated.    Kirtland Bouchard, MD

## 2021-10-05 ENCOUNTER — Encounter: Payer: Self-pay | Admitting: Internal Medicine

## 2021-10-05 ENCOUNTER — Ambulatory Visit: Payer: 59 | Admitting: Internal Medicine

## 2021-10-05 VITALS — BP 136/98 | HR 47 | Temp 96.6°F | Resp 16 | Ht 75.0 in | Wt 189.0 lb

## 2021-10-05 DIAGNOSIS — I10 Essential (primary) hypertension: Secondary | ICD-10-CM

## 2021-10-10 ENCOUNTER — Ambulatory Visit: Payer: 59 | Admitting: Internal Medicine

## 2021-10-25 NOTE — Progress Notes (Unsigned)
    Future Appointments  Date Time Provider Department  10/26/2021 11:30 AM Unk Pinto, MD [facility]  01/09/2022                3 mo ov 11:00 AM Darrol Jump, NP [facility]  04/17/2022                 6 mo ov 11:30 AM Unk Pinto, MD [facility]  09/13/2022                 cpe 11:00 AM Unk Pinto, MD [facility]    History of Present Illness:      Patient is a very nice 61 y.o. sep WM with HTN, HLD, Prediabetes,  GERD and Vitamin D Deficiency returning for 2sd short 2 week f/u of elevated BPs ranging 180/110 and last 2 weeks has taken his Amlodipine 10 mg, Atenolol 100 mg & Losartan 100 mg  - 1/2 tablet of each bid - am & pm.   He has been off of his HCTZ. Stopped his Amlodipine due to refractory edema.   Medications     atenolol (TENORMIN) 100 MG tablet, Takes  1/2 tablet  Daily    atorvastatin (LIPITOR) 40 MG tablet, Take  1 tablet  Daily     hydrochlorothiazide 25 MG tablet, Take 1 tablet (25 mg total) by mouth daily.   losartan 100 MG tablet, Take  1 tablet  Daily   NITROSTAT 0.4 MG SL tablet,  as needed for chest pain.   aspirin EC 81 MG tablet, Take 1 tablet daily.   Cholecalciferol 5000 units take  10,000 Units  daily.   pantoprazole 40 MG tablet, TAKE 1 TABLET DAILY   zinc gluconate 50 MG tablet, Take 1 tablet daily.  Problem list He has Hypertensive heart disease; Tobacco abuse; GERD (gastroesophageal reflux disease); Coronary artery disease; HTN (hypertension); Hyperlipidemia, mixed; Vitamin D deficiency; History of smoking 30 or more pack years (42); Abnormal glucose; BPH with obstruction/lower urinary tract symptoms; Excessive drinking of alcohol; Aortic atherosclerosis ([place]) by CT scan 12/2019; and Emphysema lung ([place]) on their problem list.   Observations/Objective:  BP (!) 147/93   Pulse 63   Temp (!) 96.4 F (35.8 C)   Ht '6\' 3"'$  (1.905 m)   Wt 191 lb (86.6 kg)   SpO2 99%   BMI 23.87 kg/m   HEENT - WNL. Neck - supple.  Chest - Clear equal  BS. Cor - Nl HS. RRR w/o sig MGR. PP 1(+). No edema. MS- FROM w/o deformities.  Gait Nl. Neuro -  Nl w/o focal abnormalities.   Assessment and Plan:   1. Essential hypertension  - Advised Take Atenolol 100 mg x 1/2 tab = 50 mg qam  & Take Losartan 100 mg qhs  and monitor BPs.     Follow Up Instructions:      I discussed the assessment and treatment plan with the patient. The patient was provided an opportunity to ask questions and all were answered. The patient agreed with the plan and demonstrated an understanding of the instructions.       The patient was advised to call back or seek an in-person evaluation if the symptoms worsen or if the condition fails to improve as anticipated.    Kirtland Bouchard, MD

## 2021-10-25 NOTE — Patient Instructions (Signed)
Hypertension, Adult High blood pressure (hypertension) is when the force of blood pumping through the arteries is too strong. The arteries are the blood vessels that carry blood from the heart throughout the body. Hypertension forces the heart to work harder to pump blood and may cause arteries to become narrow or stiff. Untreated or uncontrolled hypertension can lead to a heart attack, heart failure, a stroke, kidney disease, and other problems. A blood pressure reading consists of a higher number over a lower number. Ideally, your blood pressure should be below 120/80. The first ("top") number is called the systolic pressure. It is a measure of the pressure in your arteries as your heart beats. The second ("bottom") number is called the diastolic pressure. It is a measure of the pressure in your arteries as the heart relaxes. What are the causes? The exact cause of this condition is not known. There are some conditions that result in high blood pressure. What increases the risk? Certain factors may make you more likely to develop high blood pressure. Some of these risk factors are under your control, including: Smoking. Not getting enough exercise or physical activity. Being overweight. Having too much fat, sugar, calories, or salt (sodium) in your diet. Drinking too much alcohol. Other risk factors include: Having a personal history of heart disease, diabetes, high cholesterol, or kidney disease. Stress. Having a family history of high blood pressure and high cholesterol. Having obstructive sleep apnea. Age. The risk increases with age. What are the signs or symptoms? High blood pressure may not cause symptoms. Very high blood pressure (hypertensive crisis) may cause: Headache. Fast or irregular heartbeats (palpitations). Shortness of breath. Nosebleed. Nausea and vomiting. Vision changes. Severe chest pain, dizziness, and seizures. How is this diagnosed? This condition is diagnosed by  measuring your blood pressure while you are seated, with your arm resting on a flat surface, your legs uncrossed, and your feet flat on the floor. The cuff of the blood pressure monitor will be placed directly against the skin of your upper arm at the level of your heart. Blood pressure should be measured at least twice using the same arm. Certain conditions can cause a difference in blood pressure between your right and left arms. If you have a high blood pressure reading during one visit or you have normal blood pressure with other risk factors, you may be asked to: Return on a different day to have your blood pressure checked again. Monitor your blood pressure at home for 1 week or longer. If you are diagnosed with hypertension, you may have other blood or imaging tests to help your health care provider understand your overall risk for other conditions. How is this treated? This condition is treated by making healthy lifestyle changes, such as eating healthy foods, exercising more, and reducing your alcohol intake. You may be referred for counseling on a healthy diet and physical activity. Your health care provider may prescribe medicine if lifestyle changes are not enough to get your blood pressure under control and if: Your systolic blood pressure is above 130. Your diastolic blood pressure is above 80. Your personal target blood pressure may vary depending on your medical conditions, your age, and other factors. Follow these instructions at home: Eating and drinking Eat a diet that is high in fiber and potassium, and low in sodium, added sugar, and fat. An example of this eating plan is called the DASH diet. DASH stands for Dietary Approaches to Stop Hypertension. To eat this way: Eat plenty  of fresh fruits and vegetables. Try to fill one half of your plate at each meal with fruits and vegetables. Eat whole grains, such as whole-wheat pasta, brown rice, or whole-grain bread. Fill about one  fourth of your plate with whole grains. Eat or drink low-fat dairy products, such as skim milk or low-fat yogurt. Avoid fatty cuts of meat, processed or cured meats, and poultry with skin. Fill about one fourth of your plate with lean proteins, such as fish, chicken without skin, beans, eggs, or tofu. Avoid pre-made and processed foods. These tend to be higher in sodium, added sugar, and fat. Reduce your daily sodium intake. Many people with hypertension should eat less than 1,500 mg of sodium a day. Do not drink alcohol if: Your health care provider tells you not to drink. You are pregnant, may be pregnant, or are planning to become pregnant. If you drink alcohol: Limit how much you have to: 0-1 drink a day for women. 0-2 drinks a day for men. Know how much alcohol is in your drink. In the [place] one drink equals one 12 oz bottle of beer (355 mL), one 5 oz glass of wine (148 mL), or one 1 oz glass of hard liquor (44 mL). Lifestyle Work with your health care provider to maintain a healthy body weight or to lose weight. Ask what an ideal weight is for you. Get at least 30 minutes of exercise that causes your heart to beat faster (aerobic exercise) most days of the week. Activities may include walking, swimming, or biking. Include exercise to strengthen your muscles (resistance exercise), such as Pilates or lifting weights, as part of your weekly exercise routine. Try to do these types of exercises for 30 minutes at least 3 days a week. Do not use any products that contain nicotine or tobacco. These products include cigarettes, chewing tobacco, and vaping devices, such as e-cigarettes. If you need help quitting, ask your health care provider. Monitor your blood pressure at home as told by your health care provider. Keep all follow-up visits. This is important. Medicines Take over-the-counter and prescription medicines only as told by your health care provider. Follow directions carefully. Blood  pressure medicines must be taken as prescribed. Do not skip doses of blood pressure medicine. Doing this puts you at risk for problems and can make the medicine less effective. Ask your health care provider about side effects or reactions to medicines that you should watch for. Contact a health care provider if you: Think you are having a reaction to a medicine you are taking. Have headaches that keep coming back (recurring). Feel dizzy. Have swelling in your ankles. Have trouble with your vision. Get help right away if you: Develop a severe headache or confusion. Have unusual weakness or numbness. Feel faint. Have severe pain in your chest or abdomen. Vomit repeatedly. Have trouble breathing. These symptoms may be an emergency. Get help right away. Call 911. Do not wait to see if the symptoms will go away. Do not drive yourself to the hospital. Summary Hypertension is when the force of blood pumping through your arteries is too strong. If this condition is not controlled, it may put you at risk for serious complications. Your personal target blood pressure may vary depending on your medical conditions, your age, and other factors. For most people, a normal blood pressure is less than 120/80. Hypertension is treated with lifestyle changes, medicines, or a combination of both. Lifestyle changes include losing weight, eating a healthy, low-sodium diet,  exercising more, and limiting alcohol.

## 2021-10-26 ENCOUNTER — Ambulatory Visit (INDEPENDENT_AMBULATORY_CARE_PROVIDER_SITE_OTHER): Payer: 59 | Admitting: Internal Medicine

## 2021-10-26 ENCOUNTER — Encounter: Payer: Self-pay | Admitting: Internal Medicine

## 2021-10-26 VITALS — BP 147/93 | HR 63 | Temp 96.4°F | Ht 75.0 in | Wt 191.0 lb

## 2021-10-26 DIAGNOSIS — I10 Essential (primary) hypertension: Secondary | ICD-10-CM

## 2021-11-20 ENCOUNTER — Ambulatory Visit: Payer: 59 | Admitting: Nurse Practitioner

## 2021-11-20 ENCOUNTER — Encounter: Payer: Self-pay | Admitting: Nurse Practitioner

## 2021-11-20 VITALS — BP 160/88 | HR 79 | Temp 97.9°F | Resp 18 | Ht 75.0 in | Wt 189.2 lb

## 2021-11-20 DIAGNOSIS — R0981 Nasal congestion: Secondary | ICD-10-CM | POA: Diagnosis not present

## 2021-11-20 DIAGNOSIS — R5383 Other fatigue: Secondary | ICD-10-CM | POA: Diagnosis not present

## 2021-11-20 DIAGNOSIS — Z720 Tobacco use: Secondary | ICD-10-CM

## 2021-11-20 DIAGNOSIS — I10 Essential (primary) hypertension: Secondary | ICD-10-CM

## 2021-11-20 DIAGNOSIS — R051 Acute cough: Secondary | ICD-10-CM

## 2021-11-20 DIAGNOSIS — R0602 Shortness of breath: Secondary | ICD-10-CM

## 2021-11-20 DIAGNOSIS — I2510 Atherosclerotic heart disease of native coronary artery without angina pectoris: Secondary | ICD-10-CM

## 2021-11-20 DIAGNOSIS — Z1152 Encounter for screening for COVID-19: Secondary | ICD-10-CM

## 2021-11-20 LAB — TB SKIN TEST
Induration: 0 mm
TB Skin Test: NEGATIVE

## 2021-11-20 LAB — POC COVID19 BINAXNOW: SARS Coronavirus 2 Ag: NEGATIVE

## 2021-11-20 MED ORDER — AZITHROMYCIN 250 MG PO TABS: ORAL_TABLET | ORAL | 1 refills | Status: DC

## 2021-11-20 NOTE — Progress Notes (Signed)
Assessment and Plan:  Corey Collier was seen today for an episodic visit.  Diagnoses and all order for this visit:  Acute cough (productive) Stay well hydrated Suggested treatment with cough syrup Promethazine, patient declines.  - POC COVID-19 NEGATIVE  - azithromycin (ZITHROMAX) 250 MG tablet; Take 2 tablets (500 mg) on Day 1 followed by 1 tablet  (250 mg) daily until complete.  Dispense: 6 each; Refill: 1  - DG Chest 2 View; Future  Nasal congestion Stay well hydrated to keep mucus thin and productive. Take antibiotic in full.  Other fatigue Rest Continue Zinc, add Vitamin C, continue vitamin D.   Essential hypertension Has stopped Amlodipine. Continue all other medications as directed. Aim for BP <130/90. Contact office if BP remains elevated. Report to ER for any increase in stroke like symptoms, including HA, N/V, paralysis, difficulty speaking, trouble walking, confusion, vision changes, CP, heart palpitations, SOB, diaphoresis.  Coronary artery disease Continue Atorvastatin. Nitrostat PRN. Stay well hydrated.  Short of breath on exertion Does not use inhalers. EKG reviewed from 08/2021 SB with BBB. Suggested updated EKG today for evaluation of a-fib, patient refuses updated EKG and will follow up in 12/2021. Report to ER for any increase in stroke like symptoms, including HA, N/V, paralysis, difficulty speaking, trouble walking, confusion, vision changes, CP, heart palpitations, SOB, diaphoresis.  - DG Chest 2 View; Future  Tobacco Use Continues to smoke-not interested in quitting. Smoking cessation instruction/counseling given:  counseled patient on the dangers of tobacco use, advised patient to stop smoking, and reviewed strategies to maximize success  Patient is requesting basic treatment for symptoms stating he only wants to take care of the sickness.  He does not want to be worked up with any further diagnostics at this time.   Orders Placed This  Encounter  Procedures   DG Chest 2 View    Standing Status:   Future    Standing Expiration Date:   11/21/2022    Order Specific Question:   Reason for Exam (SYMPTOM  OR DIAGNOSIS REQUIRED)    Answer:   Cough, congestion x 2 weeks, SOB, fatiuge    Order Specific Question:   Preferred imaging location?    Answer:   [id number]   POC COVID-19    Order Specific Question:   Previously tested for COVID-19    Answer:   Unknown    Order Specific Question:   Resident in a congregate (group) care setting    Answer:   No    Order Specific Question:   Employed in healthcare setting    Answer:   No     Notify office for further evaluation and treatment, questions or concerns if s/s fail to improve. The risks and benefits of my recommendations, as well as other treatment options were discussed with the patient today. Questions were answered.  Further disposition pending results of labs. Discussed med's effects and SE's.    Over 20 minutes of exam, counseling, chart review, and critical decision making was performed.   Future Appointments  Date Time Provider [place] [facility]  01/29/2022 10:30 AM Unk Pinto, MD [facility] None  04/17/2022 11:30 AM Unk Pinto, MD [facility] None  09/13/2022 11:00 AM Unk Pinto, MD [facility] None    ------------------------------------------------------------------------------------------------------------------   HPI BP (!) 160/88   Pulse 79   Temp 97.9 F (36.6 C)   Resp 18   Ht '6\' 3"'$  (1.905 m)   Wt 189 lb 3.2 oz (85.8 kg)   SpO2 99%  BMI 23.65 kg/m    Subjective:     Corey Collier is a 61 y.o. male who presents for evaluation of symptoms of a URI, possible sinusitis. Symptoms include achiness, congestion, nasal congestion, no  fever, productive cough with  yellow colored sputum, and shortness of breath. Onset of symptoms was 2 weeks ago, and has been unchanged since that time. Treatment to date: none.  He has a hx of  HTN.  He is no longer taking Amlodipine d/t SE of BLE edema.  Feels as though his elevated BP is r/t stress at work.  Reports being under more stress with his job.  He is currently asymptomatic, denies CP, heart palpitations.  He does feel his hear beating "harder" at night, at times. EKG 08/2021 revealed SB with BBB. Has only had to take 1 nito tab in the past but not recently.  He feels as though he is staying well hydrated.    He is a current very day smoker.  He does not use inhalers. He is not interested in quitting.   Past Medical History:  Diagnosis Date   Coronary artery disease    a. Cath ~ 25 yrs ago, dx w/ pleurisy;  b. 07/2015 Cath/PCI: LM nl, LAD 59m(2.75x24 Synergy DES), LCX nl, RCA nl.   GERD (gastroesophageal reflux disease)    Hypertensive heart disease    Tobacco abuse      No Known Allergies  Current Outpatient Medications on File Prior to Visit  Medication Sig   amLODipine (NORVASC) 10 MG tablet Take  1 tablet  Daily  for BP   aspirin EC 81 MG tablet Take 1 tablet (81 mg total) by mouth daily.   atenolol (TENORMIN) 100 MG tablet Takes  1/2 tablet  Daily for BP   atorvastatin (LIPITOR) 40 MG tablet Take  1 tablet  Daily  for Cholesterol                          /           TAKE 1 TABLET BY MOUTH   Cholecalciferol 5000 units CHEW Chew 10,000 Units by mouth daily.   hydrochlorothiazide (HYDRODIURIL) 25 MG tablet Take 1 tablet (25 mg total) by mouth daily.   losartan (COZAAR) 100 MG tablet Take  1 tablet  Daily for BP                                                                                 /                          TAKE  BY MOUTH   nitroGLYCERIN (NITROSTAT) 0.4 MG SL tablet Place 1 tablet (0.4 mg total) under the tongue every 5 (five) minutes as needed for chest pain.   pantoprazole (PROTONIX) 40 MG tablet TAKE 1 TABLET DAILY FOR ACID INDIGESTION & REFLUX   zinc gluconate 50 MG tablet Take 1 tablet (50 mg total) by mouth daily.   No current facility-administered  medications on file prior to visit.    ROS: all negative except what is noted in the HPI.  Physical Exam:  BP (!) 160/88   Pulse 79   Temp 97.9 F (36.6 C)   Resp 18   Ht '6\' 3"'$  (1.905 m)   Wt 189 lb 3.2 oz (85.8 kg)   SpO2 99%   BMI 23.65 kg/m   General Appearance: NAD.  Awake, conversant and cooperative. Eyes: PERRLA, EOMs intact.  Sclera white.  Conjunctiva without erythema. Sinuses: No frontal/maxillary tenderness.  No nasal discharge. Nares patent.  ENT/Mouth: Ext aud canals clear.  Bilateral TMs w/DOL and without erythema or bulging. Hearing intact.  Posterior pharynx without swelling or exudate.  Tonsils without swelling or erythema.  Neck: Supple.  No masses, nodules or thyromegaly. Respiratory: Effort is regular with non-labored breathing. Breath sounds are equal bilaterally without rales, rhonchi, wheezing or stridor.  Cardio: Irregular rhythm with no MRGs. Brisk peripheral pulses without edema.  Abdomen: Active BS in all four quadrants.  Soft and non-tender without guarding, rebound tenderness, hernias or masses. Lymphatics: Non tender without lymphadenopathy.  Musculoskeletal: Full ROM, 5/5 strength, normal ambulation.  No clubbing or cyanosis. Skin: Appropriate color for ethnicity. Warm without rashes, lesions, ecchymosis, ulcers.  Neuro: CN II-XII grossly normal. Normal muscle tone without cerebellar symptoms and intact sensation.   Psych: AO X 3,  appropriate mood and affect, insight and judgment.     Darrol Jump, NP 12:22 PM [facility]

## 2021-11-28 ENCOUNTER — Other Ambulatory Visit: Payer: Self-pay | Admitting: Nurse Practitioner

## 2021-11-28 ENCOUNTER — Telehealth: Payer: Self-pay | Admitting: Nurse Practitioner

## 2021-11-28 DIAGNOSIS — R051 Acute cough: Secondary | ICD-10-CM

## 2021-11-28 MED ORDER — AZITHROMYCIN 250 MG PO TABS: ORAL_TABLET | ORAL | 1 refills | Status: DC

## 2021-11-28 NOTE — Telephone Encounter (Signed)
Left detailed message on voicemail.  

## 2021-11-28 NOTE — Telephone Encounter (Signed)
Patient completed abx as directed and was feeling better but over the weekend his symptoms returned. Please advise.Marland KitchenMarland Kitchen

## 2022-01-09 ENCOUNTER — Ambulatory Visit: Payer: 59 | Admitting: Nurse Practitioner

## 2022-01-28 NOTE — Progress Notes (Unsigned)
Corey Collier  W                                                                                                                                                                                                                                                                                Date Time Provider Department  01/29/2022 10:30 AM Unk Pinto, MD [facility]  04/17/2022 11:30 AM Unk Pinto, MD [facility]  09/13/2022 11:00 AM Unk Pinto, MD [facility]    History of Present Illness:      This very nice 61 y.o.  sep.WM with HTN, HLD, Pre-Diabetes and Vitamin D Deficiency presents for 3 month follow up .        Patient is treated for HTN (2017)  & BP has been controlled at home. Today's   .  In 2017, patient presented with ACS & had PCA & DES implanted (Dr Marlou Porch). Patient has had no complaints of any cardiac type chest pain, palpitations, dyspnea / orthopnea / PND, dizziness, claudication, or dependent edema.       Hyperlipidemia is controlled with diet & Atorvastatin. Patient denies myalgias or other med SE's. Last Lipids were at goal except elevated Trig's :  Lab Results  Component Value Date   CHOL 162 09/08/2021   HDL 84 09/08/2021   LDLCALC not calculated 09/08/2021   TRIG 446 (H) 09/08/2021   CHOLHDL 1.9 09/08/2021     Also, the patient is monitored expectantly for glucose intolerance and has had no symptoms of reactive hypoglycemia, diabetic polys, paresthesias or visual blurring.  Last A1c was normal & at goal :  Lab Results  Component Value Date   HGBA1C 5.1 09/08/2021                                                         Further, the patient also has history of Vitamin D  Deficiency  ("36" /2018)  and supplements  vitamin D . Last vitamin D was at goal:  Lab Results  Component Value Date   VD25OH 63 09/08/2021     Current Outpatient Medications on File Prior to Visit  Medication Sig   amLODipine 10 MG tablet Take  1 tablet  Daily  for BP   aspirin EC 81 MG tablet Take 1 tablet (81 mg total) by mouth daily.   atenolol 100 MG tablet Takes  1/2 tablet  Daily for BP   atorvastatin  40 MG tablet Take  1 tablet  Daily    Cholecalciferol 5000 units  10,000 Units by mouth daily.   hydrochlorothiazide 25 MG tablet Take 1 tablet (25 mg total) by mouth daily.   losartan 100 MG tablet Take  1 tablet  Daily   nitroGLYCERIN 0.4 MG SL tablet Pas needed for chest pain.   pantoprazole 40 MG tablet TAKE 1 TABLET DAILY   zinc gluconate 50 MG tablet Take 1 tablet  daily.     No Known Allergies   PMHx:   Past Medical History:  Diagnosis Date   Coronary artery disease    a. Cath ~ 25 yrs ago, dx w/ pleurisy;  b. 07/2015 Cath/PCI: LM nl, LAD 63m(2.75x24 Synergy DES), LCX nl, RCA nl.   GERD (gastroesophageal reflux disease)    Hypertensive heart disease    Tobacco abuse      Immunization History  Administered Date(s) Administered   PPD Test 05/28/2017, 08/27/2018, 09/09/2019, 09/08/2020, 09/08/2021   Tdap 01/25/2016     Past Surgical History:  Procedure Laterality Date   BACK SURGERY     CARDIAC CATHETERIZATION N/A 08/11/2015   Procedure: Left Heart Cath and Coronary Angiography;  Surgeon: MJerline Pain MD;  [facility];  Service: Cardiovascular;  Laterality: N/A;   CARDIAC CATHETERIZATION N/A 08/11/2015   Procedure: Coronary Stent Intervention;  Surgeon: MJerline Pain MD;  [facility];  Service: Cardiovascular;  Laterality: N/A;   CARDIAC CATHETERIZATION N/A 08/11/2015   Procedure: Coronary Stent Intervention;  Surgeon: JJettie Booze MD;  [facility];  Service: Cardiovascular;  Laterality: N/A;    LUMBAR DISC SURGERY      FHx:    Reviewed / unchanged  SHx:    Reviewed / unchanged   Systems Review:  Constitutional: Denies fever, chills, wt changes, headaches, insomnia, fatigue, night sweats, change in appetite. Eyes: Denies redness, blurred vision, diplopia, discharge, itchy, watery eyes.  ENT: Denies discharge, congestion, post nasal drip, epistaxis, sore throat, earache, hearing loss, dental pain, tinnitus, vertigo, sinus pain, snoring.  CV: Denies chest pain, palpitations, irregular heartbeat, syncope, dyspnea, diaphoresis, orthopnea, PND, claudication or edema. Respiratory: denies cough, dyspnea, DOE, pleurisy, hoarseness, laryngitis, wheezing.  Gastrointestinal: Denies dysphagia, odynophagia, heartburn, reflux, water brash, abdominal pain or cramps, nausea, vomiting, bloating, diarrhea, constipation, hematemesis, melena, hematochezia  or hemorrhoids. Genitourinary: Denies dysuria, frequency, urgency, nocturia, hesitancy, discharge, hematuria or flank pain. Musculoskeletal: Denies arthralgias, myalgias, stiffness, jt. swelling, pain, limping or strain/sprain.  Skin: Denies pruritus, rash, hives, warts, acne, eczema or change in skin lesion(s). Neuro: No weakness, tremor, incoordination, spasms, paresthesia or pain. Psychiatric: Denies confusion, memory loss or sensory loss. Endo: Denies change in weight, skin or hair change.  Heme/Lymph: No excessive bleeding, bruising or enlarged lymph nodes.  Physical Exam  There were no vitals taken for this visit.  Appears  well nourished, well groomed  and in no distress.  Eyes: PERRLA, EOMs, conjunctiva no swelling or erythema. Sinuses:  No frontal/maxillary tenderness ENT/Mouth: EAC's clear, TM's nl w/o erythema, bulging. Nares clear w/o erythema, swelling, exudates. Oropharynx clear without erythema or exudates. Oral hygiene is good. Tongue normal, non obstructing. Hearing intact.  Neck: Supple. Thyroid not palpable. Car 2+/2+ without  bruits, nodes or JVD. Chest: Respirations nl with BS clear & equal w/o rales, rhonchi, wheezing or stridor.  Cor: Heart sounds normal w/ regular rate and rhythm without sig. murmurs, gallops, clicks or rubs. Peripheral pulses normal and equal  without edema.  Abdomen: Soft & bowel sounds normal. Non-tender w/o guarding, rebound, hernias, masses or organomegaly.  Lymphatics: Unremarkable.  Musculoskeletal: Full ROM all peripheral extremities, joint stability, 5/5 strength and normal gait.  Skin: Warm, dry without exposed rashes, lesions or ecchymosis apparent.  Neuro: Cranial nerves intact, reflexes equal bilaterally. Sensory-motor testing grossly intact. Tendon reflexes grossly intact.  Pysch: Alert & oriented x 3.  Insight and judgement nl & appropriate. No ideations.  Assessment and Plan:   1. Essential hypertension  - Continue medication, monitor blood pressure at home.  - Continue DASH diet.  Reminder to go to the ER if any CP,  SOB, nausea, dizziness, severe HA, changes vision/speech.   - CBC with Differential/Platelet - COMPLETE METABOLIC PANEL WITH GFR - Magnesium - TSH  2. Hyperlipidemia, mixed  - Continue diet/meds, exercise,& lifestyle modifications.  - Continue monitor periodic cholesterol/liver & renal functions     - Lipid panel - TSH  3. Abnormal glucose  - Continue diet, exercise  - Lifestyle modifications.  - Monitor appropriate labs   - Hemoglobin A1c - Insulin, random  4. Vitamin D deficiency  - Continue supplementation    - VITAMIN D 25 Hydroxy  5. Coronary artery disease involving native coronary artery without angina pectoris  - Lipid panel  6. Gastroesophageal reflux disease  - CBC with Differential/Platelet  7. Medication management  - CBC with Differential/Platelet - COMPLETE METABOLIC PANEL WITH GFR - Magnesium - Lipid panel - TSH - Hemoglobin A1c - Insulin, random - VITAMIN D 25 Hydroxy           Discussed  regular  exercise, BP monitoring, weight control to achieve/maintain BMI less than 25 and discussed med and SE's. Recommended labs to assess /monitor clinical status .  I discussed the assessment and treatment plan with the patient. The patient was provided an opportunity to ask questions and all were answered. The patient agreed with the plan and demonstrated an understanding of the instructions.  I provided over 30 minutes of exam, counseling, chart review and  complex critical decision making.        The patient was advised to call back or seek an in-person evaluation if the symptoms worsen or if the condition fails to improve as anticipated.   Kirtland Bouchard, MD

## 2022-01-29 ENCOUNTER — Ambulatory Visit (INDEPENDENT_AMBULATORY_CARE_PROVIDER_SITE_OTHER): Payer: Self-pay | Admitting: Internal Medicine

## 2022-01-29 DIAGNOSIS — I10 Essential (primary) hypertension: Secondary | ICD-10-CM

## 2022-01-29 DIAGNOSIS — E782 Mixed hyperlipidemia: Secondary | ICD-10-CM

## 2022-01-29 DIAGNOSIS — I2510 Atherosclerotic heart disease of native coronary artery without angina pectoris: Secondary | ICD-10-CM

## 2022-01-29 DIAGNOSIS — Z91199 Patient's noncompliance with other medical treatment and regimen due to unspecified reason: Secondary | ICD-10-CM

## 2022-01-29 DIAGNOSIS — Z79899 Other long term (current) drug therapy: Secondary | ICD-10-CM

## 2022-01-29 DIAGNOSIS — E559 Vitamin D deficiency, unspecified: Secondary | ICD-10-CM

## 2022-01-29 DIAGNOSIS — R7309 Other abnormal glucose: Secondary | ICD-10-CM

## 2022-01-29 DIAGNOSIS — K219 Gastro-esophageal reflux disease without esophagitis: Secondary | ICD-10-CM

## 2022-02-05 ENCOUNTER — Encounter: Payer: Self-pay | Admitting: Internal Medicine

## 2022-02-05 NOTE — Patient Instructions (Signed)

## 2022-02-05 NOTE — Progress Notes (Unsigned)
Date Time Provider Department  01/29/2022 10:30 AM Unk Pinto, MD [facility]  04/17/2022                    ov 11:30 AM Unk Pinto, MD [facility]  09/13/2022                   cpe 11:00 AM Unk Pinto, MD [facility]    History of Present Illness:      This very nice 61 y.o.  sep.WM with HTN, HLD, Pre-Diabetes and Vitamin D Deficiency presents for 3 month follow up .        Patient is treated for HTN (2017)  & BP has been controlled at home. Today's   .  In 2017, patient presented with ACS & had PCA & DES implanted (Dr Marlou Porch). Patient has had no complaints of any cardiac type chest pain, palpitations, dyspnea / orthopnea / PND, dizziness, claudication, or dependent edema.       Hyperlipidemia is controlled with diet & Atorvastatin. Patient denies myalgias or other med SE's. Last Lipids were at goal except elevated Trig's :  Lab Results  Component Value Date   CHOL 162 09/08/2021   HDL 84 09/08/2021   LDLCALC not calculated 09/08/2021   TRIG 446 (H) 09/08/2021   CHOLHDL 1.9 09/08/2021     Also, the patient is monitored expectantly for glucose intolerance and has had no symptoms of reactive hypoglycemia, diabetic polys, paresthesias or visual blurring.  Last A1c was normal & at goal :  Lab Results  Component Value Date   HGBA1C 5.1 09/08/2021                                                         Further, the patient also has history of Vitamin D Deficiency  ("36" /2018)  and supplements vitamin D . Last vitamin D was at goal:  Lab Results  Component Value Date   VD25OH 63 09/08/2021     Current Outpatient Medications on File Prior to Visit  Medication Sig   amLODipine 10 MG tablet Take  1 tablet  Daily  for BP   aspirin EC 81 MG tablet Take 1 tablet (81 mg total) by mouth daily.   atenolol 100 MG tablet Takes  1/2 tablet  Daily for BP   atorvastatin  40 MG tablet Take  1 tablet  Daily    Cholecalciferol 5000 units  10,000 Units by mouth daily.    hydrochlorothiazide 25 MG tablet Take 1 tablet (25 mg total) by mouth daily.   losartan 100 MG tablet Take  1 tablet  Daily   nitroGLYCERIN 0.4 MG SL tablet Pas needed for chest pain.   pantoprazole 40 MG tablet TAKE 1 TABLET DAILY   zinc gluconate 50 MG tablet Take 1 tablet  daily.     No Known Allergies   PMHx:   Past Medical History:  Diagnosis Date   Coronary artery disease    a. Cath ~ 25 yrs ago, dx w/ pleurisy;  b. 07/2015 Cath/PCI: LM nl, LAD 61m(2.75x24 Synergy DES), LCX nl, RCA nl.   GERD (gastroesophageal reflux disease)    Hypertensive heart disease    Tobacco abuse      Immunization History  Administered Date(s) Administered   PPD Test 05/28/2017,  08/27/2018, 09/09/2019, 09/08/2020, 09/08/2021   Tdap 01/25/2016     Past Surgical History:  Procedure Laterality Date   BACK SURGERY     CARDIAC CATHETERIZATION N/A 08/11/2015   Procedure: Left Heart Cath and Coronary Angiography;  Surgeon: Jerline Pain, MD;  Location: [facility];  Service: Cardiovascular;  Laterality: N/A;   CARDIAC CATHETERIZATION N/A 08/11/2015   Procedure: Coronary Stent Intervention;  Surgeon: Jerline Pain, MD;  Location: [facility];  Service: Cardiovascular;  Laterality: N/A;   CARDIAC CATHETERIZATION N/A 08/11/2015   Procedure: Coronary Stent Intervention;  Surgeon: Jettie Booze, MD;  Location: [facility];  Service: Cardiovascular;  Laterality: N/A;   LUMBAR DISC SURGERY      FHx:    Reviewed / unchanged  SHx:    Reviewed / unchanged   Systems Review:  Constitutional: Denies fever, chills, wt changes, headaches, insomnia, fatigue, night sweats, change in appetite. Eyes: Denies redness, blurred vision, diplopia, discharge, itchy, watery eyes.  ENT: Denies discharge, congestion, post nasal drip, epistaxis, sore throat, earache, hearing loss, dental pain, tinnitus, vertigo, sinus pain, snoring.  CV: Denies chest pain, palpitations, irregular heartbeat,  syncope, dyspnea, diaphoresis, orthopnea, PND, claudication or edema. Respiratory: denies cough, dyspnea, DOE, pleurisy, hoarseness, laryngitis, wheezing.  Gastrointestinal: Denies dysphagia, odynophagia, heartburn, reflux, water brash, abdominal pain or cramps, nausea, vomiting, bloating, diarrhea, constipation, hematemesis, melena, hematochezia  or hemorrhoids. Genitourinary: Denies dysuria, frequency, urgency, nocturia, hesitancy, discharge, hematuria or flank pain. Musculoskeletal: Denies arthralgias, myalgias, stiffness, jt. swelling, pain, limping or strain/sprain.  Skin: Denies pruritus, rash, hives, warts, acne, eczema or change in skin lesion(s). Neuro: No weakness, tremor, incoordination, spasms, paresthesia or pain. Psychiatric: Denies confusion, memory loss or sensory loss. Endo: Denies change in weight, skin or hair change.  Heme/Lymph: No excessive bleeding, bruising or enlarged lymph nodes.  Physical Exam  There were no vitals taken for this visit.  Appears  well nourished, well groomed  and in no distress.  Eyes: PERRLA, EOMs, conjunctiva no swelling or erythema. Sinuses: No frontal/maxillary tenderness ENT/Mouth: EAC's clear, TM's nl w/o erythema, bulging. Nares clear w/o erythema, swelling, exudates. Oropharynx clear without erythema or exudates. Oral hygiene is good. Tongue normal, non obstructing. Hearing intact.  Neck: Supple. Thyroid not palpable. Car 2+/2+ without bruits, nodes or JVD. Chest: Respirations nl with BS clear & equal w/o rales, rhonchi, wheezing or stridor.  Cor: Heart sounds normal w/ regular rate and rhythm without sig. murmurs, gallops, clicks or rubs. Peripheral pulses normal and equal  without edema.  Abdomen: Soft & bowel sounds normal. Non-tender w/o guarding, rebound, hernias, masses or organomegaly.  Lymphatics: Unremarkable.  Musculoskeletal: Full ROM all peripheral extremities, joint stability, 5/5 strength and normal gait.  Skin: Warm, dry  without exposed rashes, lesions or ecchymosis apparent.  Neuro: Cranial nerves intact, reflexes equal bilaterally. Sensory-motor testing grossly intact. Tendon reflexes grossly intact.  Pysch: Alert & oriented x 3.  Insight and judgement nl & appropriate. No ideations.  Assessment and Plan:   1. Essential hypertension  - Continue medication, monitor blood pressure at home.  - Continue DASH diet.  Reminder to go to the ER if any CP,  SOB, nausea, dizziness, severe HA, changes vision/speech.   - CBC with Differential/Platelet - COMPLETE METABOLIC PANEL WITH GFR - Magnesium - TSH  2. Hyperlipidemia, mixed  - Continue diet/meds, exercise,& lifestyle modifications.  - Continue monitor periodic cholesterol/liver & renal functions     - Lipid panel - TSH  3. Abnormal  glucose  - Continue diet, exercise  - Lifestyle modifications.  - Monitor appropriate labs   - Hemoglobin A1c - Insulin, random  4. Vitamin D deficiency  - Continue supplementation    - VITAMIN D 25 Hydroxy  5. Coronary artery disease involving native coronary artery without angina pectoris  - Lipid panel  6. Gastroesophageal reflux disease  - CBC with Differential/Platelet  7. Medication management  - CBC with Differential/Platelet - COMPLETE METABOLIC PANEL WITH GFR - Magnesium - Lipid panel - TSH - Hemoglobin A1c - Insulin, random - VITAMIN D 25 Hydroxy           Discussed  regular exercise, BP monitoring, weight control to achieve/maintain BMI less than 25 and discussed med and SE's. Recommended labs to assess /monitor clinical status .  I discussed the assessment and treatment plan with the patient. The patient was provided an opportunity to ask questions and all were answered. The patient agreed with the plan and demonstrated an understanding of the instructions.  I provided over 30 minutes of exam, counseling, chart review and  complex critical decision making.        The patient was  advised to call back or seek an in-person evaluation if the symptoms worsen or if the condition fails to improve as anticipated.   Kirtland Bouchard, MD

## 2022-02-06 ENCOUNTER — Ambulatory Visit: Payer: 59 | Admitting: Internal Medicine

## 2022-02-06 ENCOUNTER — Encounter: Payer: Self-pay | Admitting: Internal Medicine

## 2022-02-06 VITALS — BP 160/110 | HR 88 | Temp 97.9°F | Resp 16 | Ht 75.0 in | Wt 190.0 lb

## 2022-02-06 DIAGNOSIS — I10 Essential (primary) hypertension: Secondary | ICD-10-CM | POA: Diagnosis not present

## 2022-02-06 DIAGNOSIS — E559 Vitamin D deficiency, unspecified: Secondary | ICD-10-CM

## 2022-02-06 DIAGNOSIS — Z79899 Other long term (current) drug therapy: Secondary | ICD-10-CM

## 2022-02-06 DIAGNOSIS — K219 Gastro-esophageal reflux disease without esophagitis: Secondary | ICD-10-CM

## 2022-02-06 DIAGNOSIS — R7309 Other abnormal glucose: Secondary | ICD-10-CM

## 2022-02-06 DIAGNOSIS — E782 Mixed hyperlipidemia: Secondary | ICD-10-CM | POA: Diagnosis not present

## 2022-02-06 DIAGNOSIS — I2510 Atherosclerotic heart disease of native coronary artery without angina pectoris: Secondary | ICD-10-CM

## 2022-02-06 DIAGNOSIS — I209 Angina pectoris, unspecified: Secondary | ICD-10-CM

## 2022-02-07 LAB — CBC WITH DIFFERENTIAL/PLATELET
Absolute Monocytes: 401 cells/uL (ref 200–950)
Basophils Absolute: 83 cells/uL (ref 0–200)
Basophils Relative: 1.4 %
Eosinophils Absolute: 130 cells/uL (ref 15–500)
Eosinophils Relative: 2.2 %
HCT: 47.9 % (ref 38.5–50.0)
Hemoglobin: 16.4 g/dL (ref 13.2–17.1)
Lymphs Abs: 1835 cells/uL (ref 850–3900)
MCH: 33.6 pg — ABNORMAL HIGH (ref 27.0–33.0)
MCHC: 34.2 g/dL (ref 32.0–36.0)
MCV: 98.2 fL (ref 80.0–100.0)
MPV: 11 fL (ref 7.5–12.5)
Monocytes Relative: 6.8 %
Neutro Abs: 3452 cells/uL (ref 1500–7800)
Neutrophils Relative %: 58.5 %
Platelets: 188 10*3/uL (ref 140–400)
RBC: 4.88 10*6/uL (ref 4.20–5.80)
RDW: 12.7 % (ref 11.0–15.0)
Total Lymphocyte: 31.1 %
WBC: 5.9 10*3/uL (ref 3.8–10.8)

## 2022-02-07 LAB — LIPID PANEL
Cholesterol: 168 mg/dL (ref <200)
HDL: 92 mg/dL (ref >=40)
LDL Cholesterol (Calc): 56 mg/dL (calc)
Non-HDL Cholesterol (Calc): 76 mg/dL (calc) (ref <130)
Total CHOL/HDL Ratio: 1.8 (calc) (ref <5.0)
Triglycerides: 110 mg/dL (ref <150)

## 2022-02-07 LAB — HEMOGLOBIN A1C
Hgb A1c MFr Bld: 5.7 % of total Hgb — ABNORMAL HIGH (ref <5.7)
Mean Plasma Glucose: 117 mg/dL
eAG (mmol/L): 6.5 mmol/L

## 2022-02-07 LAB — TSH: TSH: 0.74 mIU/L (ref 0.40–4.50)

## 2022-02-07 LAB — COMPLETE METABOLIC PANEL WITH GFR
AG Ratio: 1.6 (calc) (ref 1.0–2.5)
ALT: 33 U/L (ref 9–46)
AST: 31 U/L (ref 10–35)
Albumin: 4.6 g/dL (ref 3.6–5.1)
Alkaline phosphatase (APISO): 86 U/L (ref 35–144)
BUN: 12 mg/dL (ref 7–25)
CO2: 28 mmol/L (ref 20–32)
Calcium: 10 mg/dL (ref 8.6–10.3)
Chloride: 102 mmol/L (ref 98–110)
Creat: 1.06 mg/dL (ref 0.70–1.35)
Globulin: 2.9 g/dL (calc) (ref 1.9–3.7)
Glucose, Bld: 138 mg/dL — ABNORMAL HIGH (ref 65–99)
Potassium: 4.5 mmol/L (ref 3.5–5.3)
Sodium: 140 mmol/L (ref 135–146)
Total Bilirubin: 1 mg/dL (ref 0.2–1.2)
Total Protein: 7.5 g/dL (ref 6.1–8.1)
eGFR: 80 mL/min/{1.73_m2} (ref >=60)

## 2022-02-07 LAB — INSULIN, RANDOM: Insulin: 41.2 u[IU]/mL — ABNORMAL HIGH

## 2022-02-07 LAB — VITAMIN D 25 HYDROXY (VIT D DEFICIENCY, FRACTURES): Vit D, 25-Hydroxy: 47 ng/mL (ref 30–100)

## 2022-02-07 LAB — MAGNESIUM: Magnesium: 2 mg/dL (ref 1.5–2.5)

## 2022-02-07 NOTE — Progress Notes (Signed)
<><><><><><><><><><><><><><><><><><><><><><><><><><><><><><><><><> <><><><><><><><><><><><><><><><><><><><><><><><><><><><><><><><><> -   Test results slightly outside the reference range are not unusual. If there is anything important, I will review this with you,  otherwise it is considered normal test values.  If you have further questions,  please do not hesitate to contact me at the office or via My Chart.  <><><><><><><><><><><><><><><><><><><><><><><><><><><><><><><><><>  - A1c = 5.7% - is borderline, So    - Avoid Sweets, Candy & White Stuff   - White Rice, White Potatoes, White Flour  - Breads &  Pasta <><><><><><><><><><><><><><><><><><><><><><><><><><><><><><><><><> <><><><><><><><><><><><><><><><><><><><><><><><><><><><><><><><><>  -  Total Chol = 168   &   LDL Chol = 58  =     Both Excellent  <><><><><><><><><><><><><><><><><><><><><><><><><><><><><><><><><> <><><><><><><><><><><><><><><><><><><><><><><><><><><><><><><><><>  -   All Else - CBC - Kidneys - Electrolytes - Liver - Magnesium & Thyroid    - all  Normal / OK <><><><><><><><><><><><><><><><><><><><><><><><><><><><><><><><><> <><><><><><><><><><><><><><><><><><><><><><><><><><><><><><><><><>   -  Keep up the  Great Work  !  <><><><><><><><><><><><><><><><><><><><><><><><><><><><><><><><><> <><><><><><><><><><><><><><><><><><><><><><><><><><><><><><><><><>

## 2022-02-24 ENCOUNTER — Other Ambulatory Visit: Payer: Self-pay | Admitting: Internal Medicine

## 2022-02-24 DIAGNOSIS — E782 Mixed hyperlipidemia: Secondary | ICD-10-CM

## 2022-02-27 ENCOUNTER — Ambulatory Visit: Payer: 59 | Admitting: Internal Medicine

## 2022-02-27 ENCOUNTER — Encounter: Payer: Self-pay | Admitting: Internal Medicine

## 2022-02-27 VITALS — BP 140/90 | HR 78 | Temp 97.9°F | Resp 16 | Ht 75.0 in | Wt 189.6 lb

## 2022-02-27 DIAGNOSIS — I10 Essential (primary) hypertension: Secondary | ICD-10-CM | POA: Diagnosis not present

## 2022-02-27 MED ORDER — OLMESARTAN MEDOXOMIL 40 MG PO TABS: ORAL_TABLET | ORAL | 3 refills | Status: DC

## 2022-02-27 NOTE — Patient Instructions (Signed)
Hypertension, Adult Hypertension is another name for high blood pressure. High blood pressure forces your heart to work harder to pump blood. This can cause problems over time. There are two numbers in a blood pressure reading. There is a top number (systolic) over a bottom number (diastolic). It is best to have a blood pressure that is below 120/80. What are the causes? The cause of this condition is not known. Some other conditions can lead to high blood pressure. What increases the risk? Some lifestyle factors can make you more likely to develop high blood pressure: Smoking. Not getting enough exercise or physical activity. Being overweight. Having too much fat, sugar, calories, or salt (sodium) in your diet. Drinking too much alcohol. Other risk factors include: Having any of these conditions: Heart disease. Diabetes. High cholesterol. Kidney disease. Obstructive sleep apnea. Having a family history of high blood pressure and high cholesterol. Age. The risk increases with age. Stress. What are the signs or symptoms? High blood pressure may not cause symptoms. Very high blood pressure (hypertensive crisis) may cause: Headache. Fast or uneven heartbeats (palpitations). Shortness of breath. Nosebleed. Vomiting or feeling like you may vomit (nauseous). Changes in how you see. Very bad chest pain. Feeling dizzy. Seizures. How is this treated? This condition is treated by making healthy lifestyle changes, such as: Eating healthy foods. Exercising more. Drinking less alcohol. Your doctor may prescribe medicine if lifestyle changes do not help enough and if: Your top number is above 130. Your bottom number is above 80. Your personal target blood pressure may vary. Follow these instructions at home: Eating and drinking If told, follow the DASH eating plan. To follow this plan: Fill one half of your plate at each meal with fruits and vegetables. Fill one fourth of your plate  at each meal with whole grains. Whole grains include whole-wheat pasta, brown rice, and whole-grain bread. Eat or drink low-fat dairy products, such as skim milk or low-fat yogurt. Fill one fourth of your plate at each meal with low-fat (lean) proteins. Low-fat proteins include fish, chicken without skin, eggs, beans, and tofu. Avoid fatty meat, cured and processed meat, or chicken with skin. Avoid pre-made or processed food. Limit the amount of salt in your diet to less than 1,500 mg each day. Do not drink alcohol if: Your doctor tells you not to drink. You are pregnant, may be pregnant, or are planning to become pregnant. If you drink alcohol: Limit how much you have to: 0-1 drink a day for women. 0-2 drinks a day for men. Know how much alcohol is in your drink. In the [place] one drink equals one 12 oz bottle of beer (355 mL), one 5 oz glass of wine (148 mL), or one 1 oz glass of hard liquor (44 mL). Lifestyle Work with your doctor to stay at a healthy weight or to lose weight. Ask your doctor what the best weight is for you. Get at least 30 minutes of exercise that causes your heart to beat faster (aerobic exercise) most days of the week. This may include walking, swimming, or biking. Get at least 30 minutes of exercise that strengthens your muscles (resistance exercise) at least 3 days a week. This may include lifting weights or doing Pilates. Do not smoke or use any products that contain nicotine or tobacco. If you need help quitting, ask your doctor. Check your blood pressure at home as told by your doctor. Keep all follow-up visits. Medicines Take over-the-counter and prescription medicines only as  told by your doctor. Follow directions carefully. Do not skip doses of blood pressure medicine. The medicine does not work as well if you skip doses. Skipping doses also puts you at risk for problems. Ask your doctor about side effects or reactions to medicines that you should watch  for. Contact a doctor if: You think you are having a reaction to the medicine you are taking. You have headaches that keep coming back. You feel dizzy. You have swelling in your ankles. You have trouble with your vision. Get help right away if: You get a very bad headache. You start to feel mixed up (confused). You feel weak or numb. You feel faint. You have very bad pain in your: Chest. Belly (abdomen). You vomit more than once. You have trouble breathing. These symptoms may be an emergency. Get help right away. Call 911. Do not wait to see if the symptoms will go away. Do not drive yourself to the hospital. Summary Hypertension is another name for high blood pressure. High blood pressure forces your heart to work harder to pump blood. For most people, a normal blood pressure is less than 120/80. Making healthy choices can help lower blood pressure. If your blood pressure does not get lower with healthy choices, you may need to take medicine.

## 2022-02-27 NOTE — Progress Notes (Signed)
C  Future Appointments  Date Time Provider Department  04/17/2022 11:30 AM Unk Pinto, MD [facility]  09/13/2022 11:00 AM Unk Pinto, MD [facility]    History of Present Illness:                                          This very nice 61 y.o.  sep.WM with HTN, ASCAD,  HLD, Pre-Diabetes and Vitamin D Deficiency who presents for week  follow up  of elevated BP 160/110 after stopping his Amlodipine . He was advised to restart Amlodipine 10 mg x 1/2 = 5 mg qam  & increase his Atenolol 100 mg back to 1 whole tab    Current Outpatient Medications  (Cardiovascular):     amLODipine 10 MG tablet, Take  1 tablet  Daily - Taking 1/2 tab ( 5 mg) qam   atenolol 100 MG tablet, Takes  1/2 tablet  Daily        -  Not taking    losartan  100 MG tablet, Take  1 tablet  Daily    atorvastatin  40 MG tablet, TAKE 1 TABLET DAILY   NITROSTAT 0.4 MG  as needed for chest pain.   aspirin EC 81 MG tablet, Take daily.   Cholecalciferol 5000 units , take  10,000 Units  daily.   pantoprazole  40 MG tablet, TAKE 1 TABLET DAILY    zinc gluconate 50 MG tablet, Take 1 tablet daily.  PMHx:       Past Medical History:  Diagnosis Date   Coronary artery disease      a. Cath ~ 25 yrs ago, dx w/ pleurisy;  b. 07/2015 Cath/PCI: LM nl, LAD 37m(2.75x24 Synergy DES), LCX nl, RCA nl.   GERD (gastroesophageal reflux disease)     Hypertensive heart disease     Tobacco abuse         Observations/Objective:  BP (!) 140/90   Pulse 78   Temp 97.9 F (36.6 C)   Resp 16   Ht '6\' 3"'$  (1.905 m)   Wt 189 lb 9.6 oz (86 kg)   SpO2 99%   BMI 23.70 kg/m   Rechecked BP x 3   = 174/104   HEENT - WNL. Neck - supple.  Chest - Clear equal BS. Cor - Nl HS. RRR w/o sig MGR. PP 1(+). No edema. MS- FROM w/o deformities.  Gait Nl. Neuro -  Nl w/o focal abnormalities.   Assessment and Plan:   1. labile hypertension  Replace Losartan with \                        - olmesartan (BENICAR) 40 MG tablet;                          Take  1 tablet  Daily  at Night  for BP  ( replaces Losartan)                            Dispense: 90 tablet; Refill: 3                         - HCTZ 25 mg - > Recommend resume 1/2 tablet 3 x /week                          -  Continue  atenolol 100 MG tablet, Takes  1/2 tablet  Daily    -  - Advised home monitoring of BP  & ROV 2-3 weeks   Follow Up Instructions:        I discussed the assessment and treatment plan with the patient. The patient was provided an opportunity to ask questions and all were answered. The patient agreed with the  plan and demonstrated an understanding of the instructions.       The patient was advised to call back or seek an in-person evaluation if the symptoms worsen or if the condition fails to improve as anticipated.    Kirtland Bouchard, MD

## 2022-04-17 ENCOUNTER — Encounter: Payer: Self-pay | Admitting: Internal Medicine

## 2022-04-17 ENCOUNTER — Ambulatory Visit: Payer: 59 | Admitting: Internal Medicine

## 2022-04-17 VITALS — BP 130/80 | HR 90 | Temp 97.9°F | Resp 17 | Ht 75.0 in | Wt 191.8 lb

## 2022-04-17 DIAGNOSIS — R7309 Other abnormal glucose: Secondary | ICD-10-CM | POA: Diagnosis not present

## 2022-04-17 DIAGNOSIS — E782 Mixed hyperlipidemia: Secondary | ICD-10-CM

## 2022-04-17 DIAGNOSIS — Z79899 Other long term (current) drug therapy: Secondary | ICD-10-CM

## 2022-04-17 DIAGNOSIS — E559 Vitamin D deficiency, unspecified: Secondary | ICD-10-CM

## 2022-04-17 DIAGNOSIS — I2510 Atherosclerotic heart disease of native coronary artery without angina pectoris: Secondary | ICD-10-CM | POA: Diagnosis not present

## 2022-04-17 DIAGNOSIS — I10 Essential (primary) hypertension: Secondary | ICD-10-CM

## 2022-04-17 DIAGNOSIS — K219 Gastro-esophageal reflux disease without esophagitis: Secondary | ICD-10-CM

## 2022-04-17 NOTE — Progress Notes (Signed)
Future Appointments  Date Time Provider Department  04/17/2022           6 mo ov 11:30 AM Unk Pinto, MD [facility]    History of Present Illness:      This very nice 62 y.o.  sep.WM with HTN, HLD, Pre-Diabetes and Vitamin D Deficiency presents for 6 month follow up .         Patient is treated for HTN since 2017 & BP has been controlled at home. Today's BP is at goal - 130/80 .  In 2017, patient presented with ACS & had PCA & DES implanted (Dr Marlou Porch). Patient has had no complaints of any cardiac type chest pain, palpitations, dyspnea / orthopnea / PND, dizziness, claudication, or dependent edema.       Hyperlipidemia is controlled with diet & Atorvastatin. Patient denies myalgias or other med SE's. Last Lipids were at goal except elevated Trig's :  Lab Results  Component Value Date   CHOL 168 02/06/2022   HDL 92 02/06/2022   LDLCALC 56 02/06/2022   TRIG 110 02/06/2022   CHOLHDL 1.8 02/06/2022     Also, the patient is monitored expectantly for glucose intolerance and has had no symptoms of reactive hypoglycemia, diabetic polys, paresthesias or visual blurring.  Last A1c was near goal :  Lab Results  Component Value Date   HGBA1C 5.7 (H) 02/06/2022                                                       Further, the patient also has history of Vitamin D Deficiency  ("36" /2018)  and supplements vitamin D . Last vitamin D was not at goal (70-100) :  Lab Results  Component Value Date   VD25OH 47 02/06/2022       Current Outpatient Medications  Medication Instructions   amLODipine (10 MG tablet Take  1 tablet  Daily    aspirin EC  81 mg Daily   atenolol  100 MG tablet Takes  1/2 tablet  Daily    atorvastatin  40 MG tablet TAKE 1 TABLET DAILY    Cholecalciferol   10,000 Units Daily   hydrochlorothiazide  25 mg Daily   NITROSTAT   0.4 mg SL Every 5 min PRN  CP   olmesartan 40 MG tablet Take  1 tablet  at Night    pantoprazole 40 MG tablet TAKE 1 TABLET  DAILY    zinc   50 mg Daily     No Known Allergies   PMHx:   Past Medical History:  Diagnosis Date   Coronary artery disease    a. Cath ~ 25 yrs ago, dx w/ pleurisy;  b. 07/2015 Cath/PCI: LM nl, LAD 45m(2.75x24 Synergy DES), LCX nl, RCA nl.   GERD (gastroesophageal reflux disease)    Hypertensive heart disease    Tobacco abuse      Immunization History  Administered Date(s) Administered   PPD Test 05/28/2017, 08/27/2018, 09/09/2019, 09/08/2020, 09/08/2021   Tdap 01/25/2016     Past Surgical History:  Procedure Laterality Date   BACK SURGERY     CARDIAC CATHETERIZATION N/A 08/11/2015   Procedure: Left Heart Cath and Coronary Angiography;  Surgeon: MJerline Pain MD;  [facility];  Service: Cardiovascular;  Laterality: N/A;  CARDIAC CATHETERIZATION N/A 08/11/2015   Procedure: Coronary Stent Intervention;  Surgeon: Jerline Pain, MD;  Location: [facility];  Service: Cardiovascular;  Laterality: N/A;   CARDIAC CATHETERIZATION N/A 08/11/2015   Procedure: Coronary Stent Intervention;  Surgeon: Jettie Booze, MD;  Location: [facility];  Service: Cardiovascular;  Laterality: N/A;   LUMBAR DISC SURGERY      FHx:    Reviewed / unchanged  SHx:    Reviewed / unchanged   Systems Review:  Constitutional: Denies fever, chills, wt changes, headaches, insomnia, fatigue, night sweats, change in appetite. Eyes: Denies redness, blurred vision, diplopia, discharge, itchy, watery eyes.  ENT: Denies discharge, congestion, post nasal drip, epistaxis, sore throat, earache, hearing loss, dental pain, tinnitus, vertigo, sinus pain, snoring.  CV: Denies chest pain, palpitations, irregular heartbeat, syncope, dyspnea, diaphoresis, orthopnea, PND, claudication or edema. Respiratory: denies cough, dyspnea, DOE, pleurisy, hoarseness, laryngitis, wheezing.  Gastrointestinal: Denies dysphagia, odynophagia, heartburn, reflux, water brash, abdominal pain or cramps,  nausea, vomiting, bloating, diarrhea, constipation, hematemesis, melena, hematochezia  or hemorrhoids. Genitourinary: Denies dysuria, frequency, urgency, nocturia, hesitancy, discharge, hematuria or flank pain. Musculoskeletal: Denies arthralgias, myalgias, stiffness, jt. swelling, pain, limping or strain/sprain.  Skin: Denies pruritus, rash, hives, warts, acne, eczema or change in skin lesion(s). Neuro: No weakness, tremor, incoordination, spasms, paresthesia or pain. Psychiatric: Denies confusion, memory loss or sensory loss. Endo: Denies change in weight, skin or hair change.  Heme/Lymph: No excessive bleeding, bruising or enlarged lymph nodes.  Physical Exam  BP 130/80   Pulse 90   Temp 97.9 F (36.6 C)   Resp 17   Ht '6\' 3"'$  (1.905 m)   Wt 191 lb 12.8 oz (87 kg)   SpO2 99%   BMI 23.97 kg/m   Appears  well nourished, well groomed  and in no distress.  Eyes: PERRLA, EOMs, conjunctiva no swelling or erythema. Sinuses: No frontal/maxillary tenderness ENT/Mouth: EAC's clear, TM's nl w/o erythema, bulging. Nares clear w/o erythema, swelling, exudates. Oropharynx clear without erythema or exudates. Oral hygiene is good. Tongue normal, non obstructing. Hearing intact.  Neck: Supple. Thyroid not palpable. Car 2+/2+ without bruits, nodes or JVD. Chest: Respirations nl with BS clear & equal w/o rales, rhonchi, wheezing or stridor.  Cor: Heart sounds normal w/ regular rate and rhythm without sig. murmurs, gallops, clicks or rubs. Peripheral pulses normal and equal  without edema.  Abdomen: Soft & bowel sounds normal. Non-tender w/o guarding, rebound, hernias, masses or organomegaly.  Lymphatics: Unremarkable.  Musculoskeletal: Full ROM all peripheral extremities, joint stability, 5/5 strength and normal gait.  Skin: Warm, dry without exposed rashes, lesions or ecchymosis apparent.  Neuro: Cranial nerves intact, reflexes equal bilaterally. Sensory-motor testing grossly intact. Tendon reflexes  grossly intact.  Pysch: Alert & oriented x 3.  Insight and judgement nl & appropriate. No ideations.  Assessment and Plan:   1. Essential hypertension  - Continue medication, monitor blood pressure at home.  - Continue DASH diet.  Reminder to go to the ER if any CP,  SOB, nausea, dizziness, severe HA, changes vision/speech.    - CBC with Differential/Platelet - COMPLETE METABOLIC PANEL WITH GFR - Magnesium - TSH   2. Hyperlipidemia, mixed  - Continue diet/meds, exercise & lifestyle modifications.  - Continue monitor periodic cholesterol/liver & renal functions     - Lipid panel - TSH   3. Coronary artery disease involving native coronary artery without  angina pectoris, unspecified whether native or transplanted heart  - Lipid panel  4. Abnormal glucose  - Continue diet, exercise  - Lifestyle modifications.  - Monitor appropriate labs   - Hemoglobin A1c - Insulin, random   5. Vitamin D deficiency   - Continue supplementation   - VITAMIN D 25 Hydroxy   6. Gastroesophageal reflux disease  - CBC with Differential/Platelet  7. Medication management  - CBC with Differential/Platelet - COMPLETE METABOLIC PANEL WITH GFR - Magnesium - Lipid panel - TSH - Hemoglobin A1c - Insulin, random - VITAMIN D 25 Hydroxy          Discussed  regular exercise, BP monitoring, weight control to achieve/maintain BMI less than 25 and discussed med and SE's. Recommended labs to assess /monitor clinical status .  I discussed the assessment and treatment plan with the patient. The patient was provided an opportunity to ask questions and all were answered. The patient agreed with the plan and demonstrated an understanding of the instructions.  I provided over 30 minutes of exam, counseling, chart review and  complex critical decision making.        The patient was advised to call back or seek an in-person evaluation if the symptoms worsen or if the condition fails to improve  as anticipated.   Kirtland Bouchard, MD.

## 2022-04-17 NOTE — Patient Instructions (Signed)

## 2022-04-18 LAB — COMPLETE METABOLIC PANEL WITH GFR
AG Ratio: 1.8 (calc) (ref 1.0–2.5)
ALT: 30 U/L (ref 9–46)
AST: 28 U/L (ref 10–35)
Albumin: 4.8 g/dL (ref 3.6–5.1)
Alkaline phosphatase (APISO): 82 U/L (ref 35–144)
BUN: 14 mg/dL (ref 7–25)
CO2: 29 mmol/L (ref 20–32)
Calcium: 10 mg/dL (ref 8.6–10.3)
Chloride: 104 mmol/L (ref 98–110)
Creat: 1 mg/dL (ref 0.70–1.35)
Globulin: 2.6 g/dL (calc) (ref 1.9–3.7)
Glucose, Bld: 90 mg/dL (ref 65–99)
Potassium: 4.4 mmol/L (ref 3.5–5.3)
Sodium: 142 mmol/L (ref 135–146)
Total Bilirubin: 0.7 mg/dL (ref 0.2–1.2)
Total Protein: 7.4 g/dL (ref 6.1–8.1)
eGFR: 86 mL/min/{1.73_m2} (ref >=60)

## 2022-04-18 LAB — CBC WITH DIFFERENTIAL/PLATELET
Absolute Monocytes: 432 cells/uL (ref 200–950)
Basophils Absolute: 78 cells/uL (ref 0–200)
Basophils Relative: 1.5 %
Eosinophils Absolute: 161 cells/uL (ref 15–500)
Eosinophils Relative: 3.1 %
HCT: 45.3 % (ref 38.5–50.0)
Hemoglobin: 15.6 g/dL (ref 13.2–17.1)
Lymphs Abs: 2366 cells/uL (ref 850–3900)
MCH: 33.3 pg — ABNORMAL HIGH (ref 27.0–33.0)
MCHC: 34.4 g/dL (ref 32.0–36.0)
MCV: 96.6 fL (ref 80.0–100.0)
MPV: 10.8 fL (ref 7.5–12.5)
Monocytes Relative: 8.3 %
Neutro Abs: 2163 cells/uL (ref 1500–7800)
Neutrophils Relative %: 41.6 %
Platelets: 199 10*3/uL (ref 140–400)
RBC: 4.69 10*6/uL (ref 4.20–5.80)
RDW: 13.5 % (ref 11.0–15.0)
Total Lymphocyte: 45.5 %
WBC: 5.2 10*3/uL (ref 3.8–10.8)

## 2022-04-18 LAB — MAGNESIUM: Magnesium: 2.1 mg/dL (ref 1.5–2.5)

## 2022-04-18 LAB — HEMOGLOBIN A1C
Hgb A1c MFr Bld: 5.8 % of total Hgb — ABNORMAL HIGH (ref <5.7)
Mean Plasma Glucose: 120 mg/dL
eAG (mmol/L): 6.6 mmol/L

## 2022-04-18 LAB — LIPID PANEL
Cholesterol: 167 mg/dL (ref <200)
HDL: 109 mg/dL (ref >=40)
LDL Cholesterol (Calc): 33 mg/dL (calc)
Non-HDL Cholesterol (Calc): 58 mg/dL (calc) (ref <130)
Total CHOL/HDL Ratio: 1.5 (calc) (ref <5.0)
Triglycerides: 169 mg/dL — ABNORMAL HIGH (ref <150)

## 2022-04-18 LAB — VITAMIN D 25 HYDROXY (VIT D DEFICIENCY, FRACTURES): Vit D, 25-Hydroxy: 38 ng/mL (ref 30–100)

## 2022-04-18 LAB — INSULIN, RANDOM: Insulin: 17.6 u[IU]/mL

## 2022-04-18 LAB — TSH: TSH: 0.53 mIU/L (ref 0.40–4.50)

## 2022-07-17 ENCOUNTER — Ambulatory Visit: Payer: 59 | Admitting: Nurse Practitioner

## 2022-07-25 IMAGING — CT CT CHEST LUNG CANCER SCREENING LOW DOSE W/O CM
2 of 5 series · 15 of 40 positions shown, 18 images · non-contrast
Comparison: 01/01/2020.

CLINICAL DATA: Current smoker, 43 pack-year history.

EXAM:
CT CHEST WITHOUT CONTRAST LOW-DOSE FOR LUNG CANCER SCREENING
TECHNIQUE: Multidetector CT imaging of the chest was performed following the
standard protocol without IV contrast.

[Series 4: lung 1.00 br44 cor · coronal · 0.63mm/px · 3 of 316 slices shown]
[im 64/316  lung]
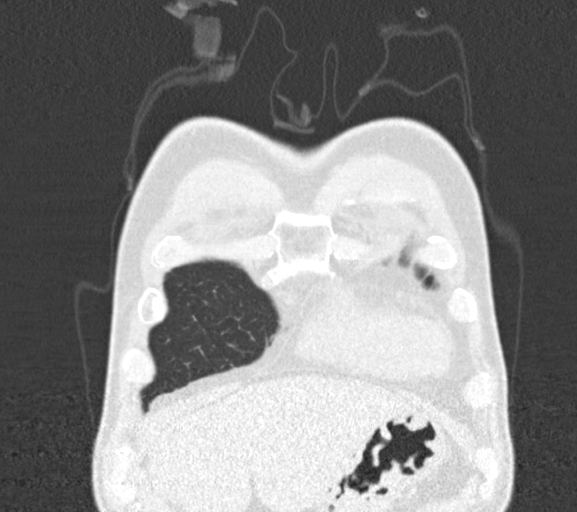
[im 127/316  lung]
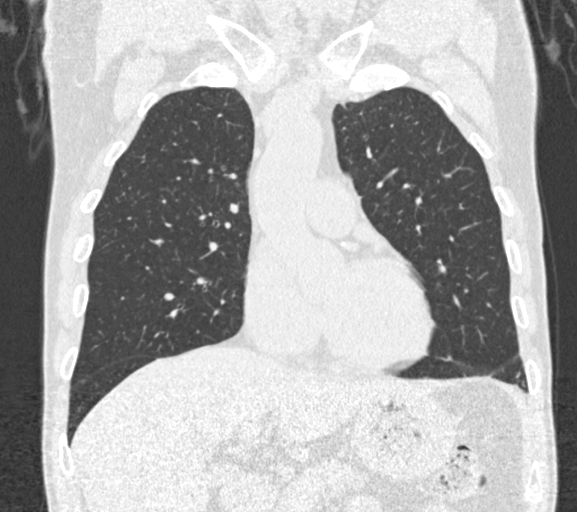
[im 190/316  lung]
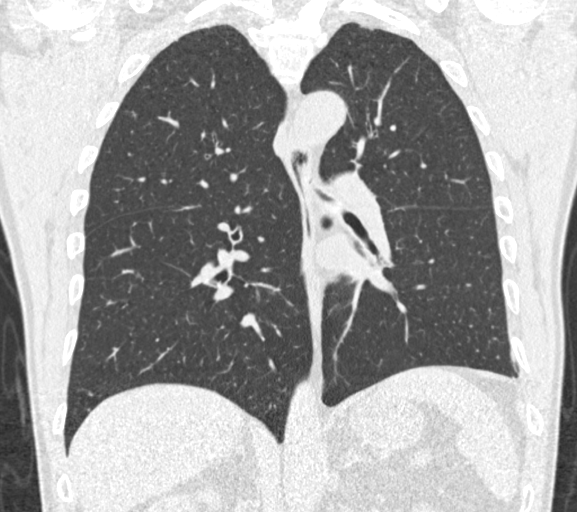

[Series 9: lung 1.00 br60 axial · axial · 0.71mm/px · z∈[-1248,-955]mm · 12 of 323 slices shown, 15 images]
[im 15/323  mediastinal]
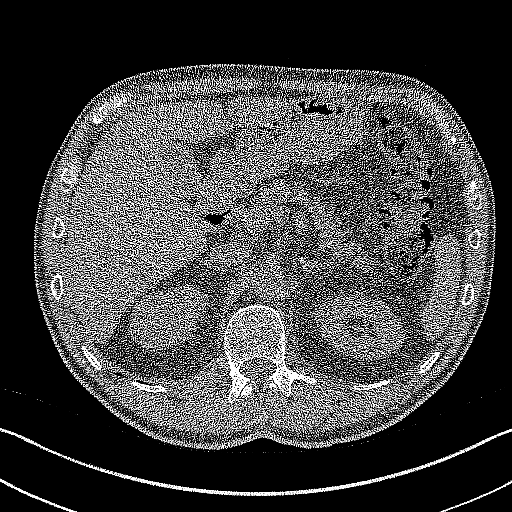
[im 15/323  lung]
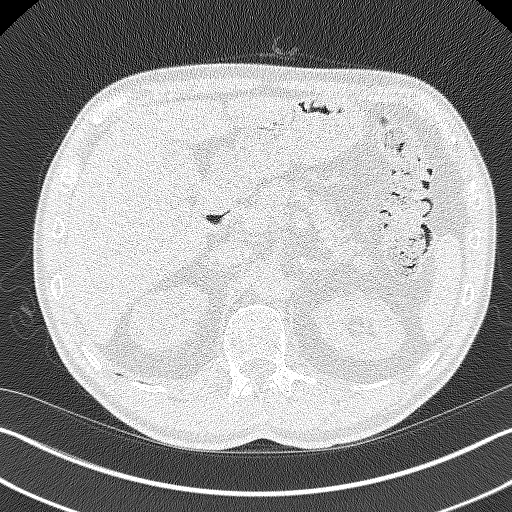
[im 44/323  lung]
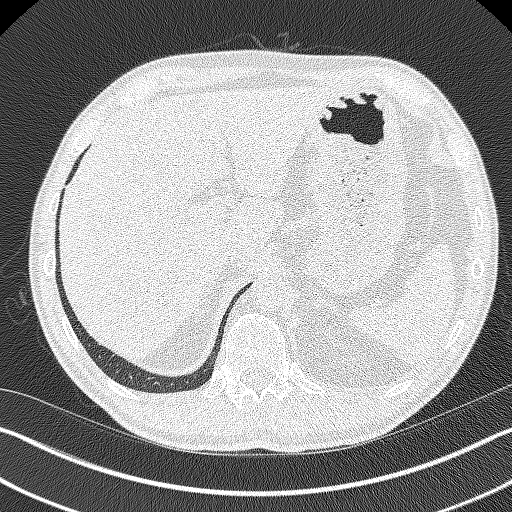
[im 74/323  lung]
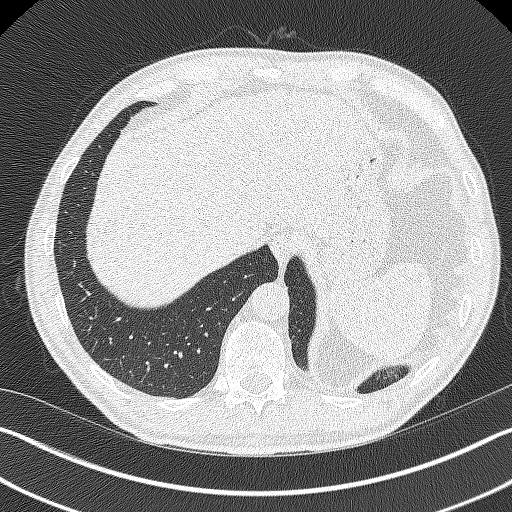
[im 103/323  lung]
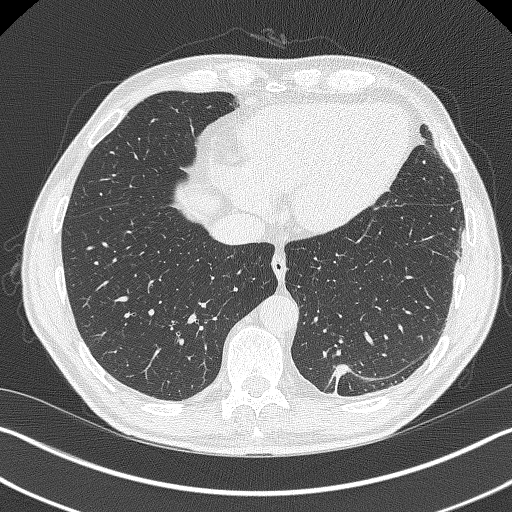
[im 118/323  mediastinal]
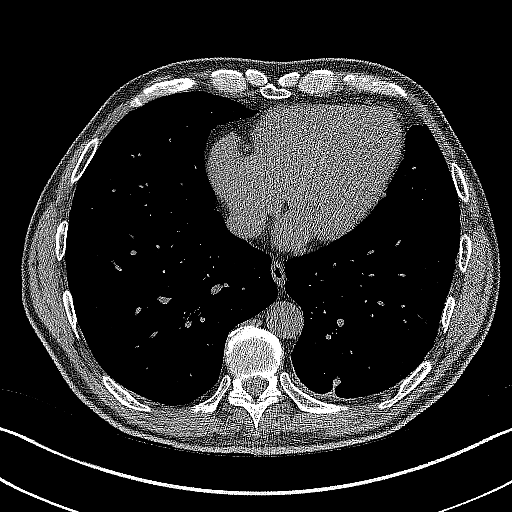
[im 118/323  lung]
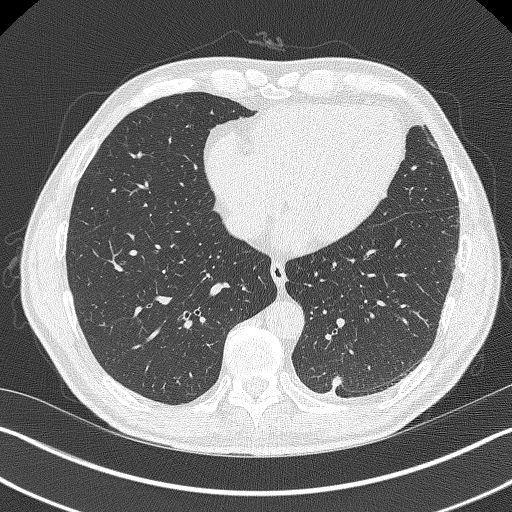
[im 147/323  lung]
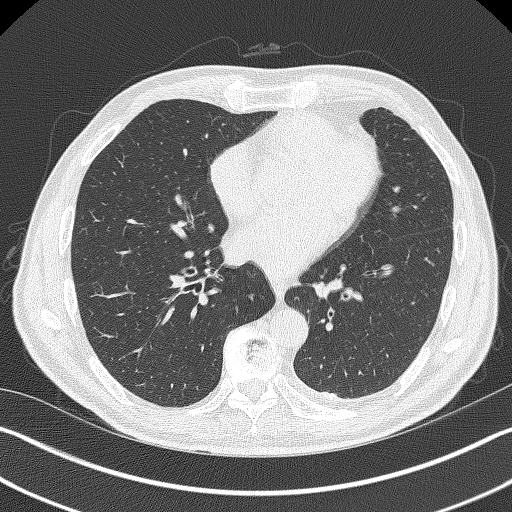
[im 176/323  lung]
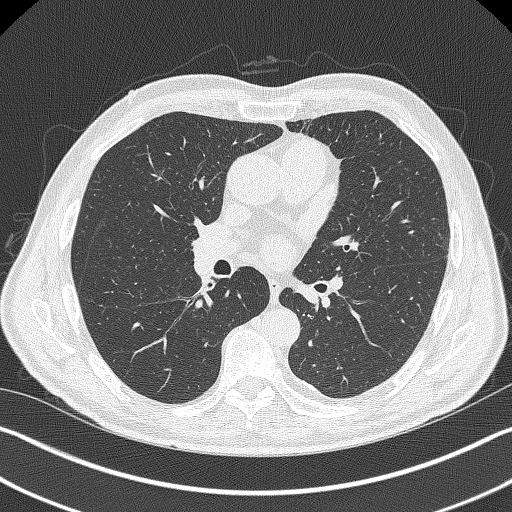
[im 205/323  lung]
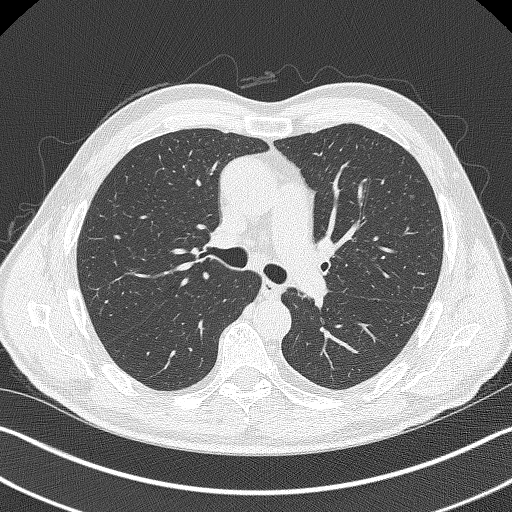
[im 220/323  mediastinal]
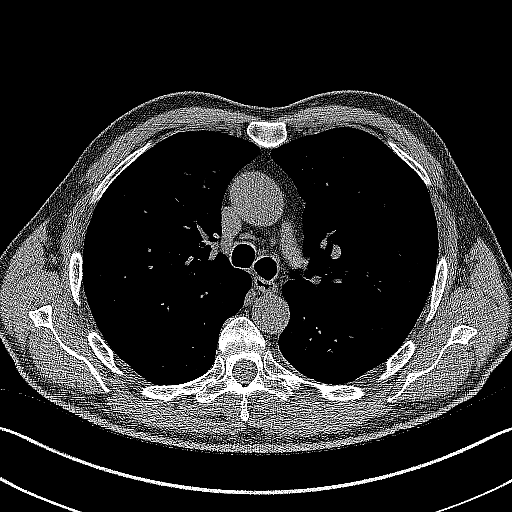
[im 220/323  lung]
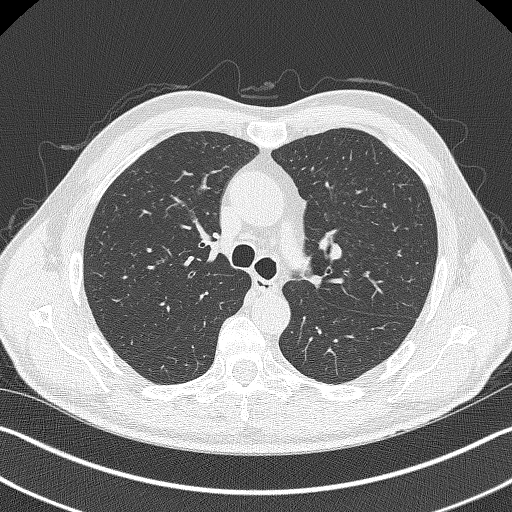
[im 249/323  lung]
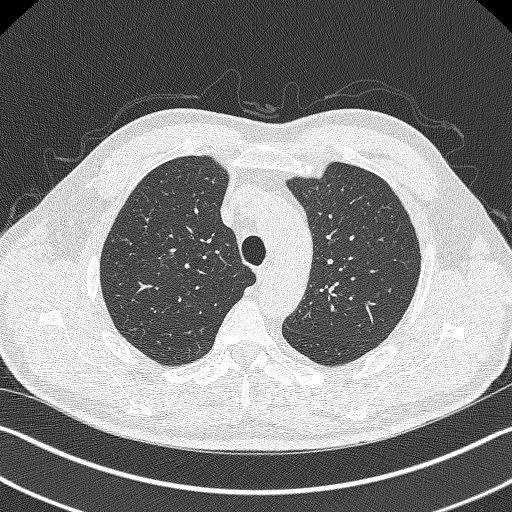
[im 279/323  lung]
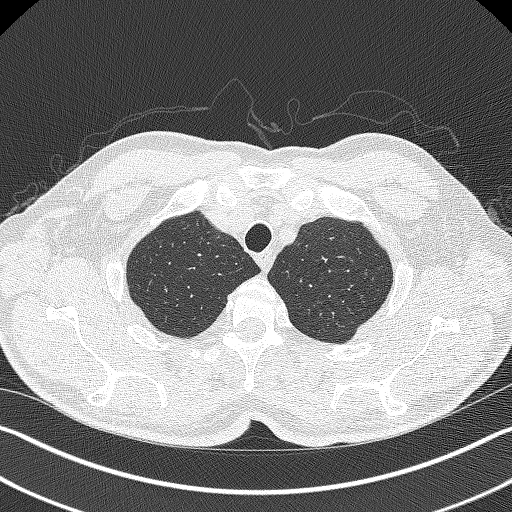
[im 308/323  lung]
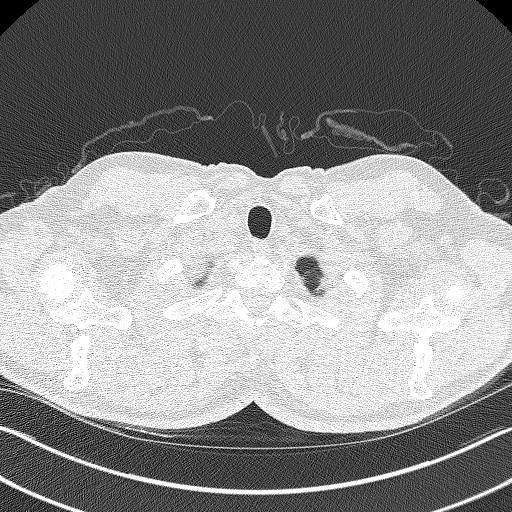

[15 of 40 positions shown; findings below may reference images not displayed]

FINDINGS: Cardiovascular: Coronary artery calcification. Heart is at the upper
limits of normal in size. No pericardial effusion.

Mediastinum/Nodes: No pathologically enlarged mediastinal or
axillary lymph nodes. Hilar regions are difficult to evaluate
without IV contrast but appear grossly unremarkable. Esophagus is
grossly unremarkable.

Lungs/Pleura: Mild centrilobular emphysema. Smoking related
respiratory bronchiolitis. Pleuroparenchymal scarring in the
posterior lower left hemithorax. Pulmonary nodules measure 4.0 mm or
less in size, as before. No pleural fluid. Airway is unremarkable.

Upper Abdomen: Visualized portions the liver, gallbladder, adrenal
glands, kidneys, spleen, pancreas, stomach and bowel are grossly
unremarkable. No upper abdominal adenopathy.

Musculoskeletal: No worrisome lytic or sclerotic lesions.
IMPRESSION: 1. Lung-RADS 2, benign appearance or behavior. Continue annual
screening with low-dose chest CT without contrast in 12 months.
2. Coronary artery calcification.
3.  Emphysema ([id number]).

## 2022-07-30 ENCOUNTER — Ambulatory Visit: Payer: 59 | Admitting: Nurse Practitioner

## 2022-07-30 NOTE — Progress Notes (Deleted)
FOLLOW UP  Corey Collier was seen today for a follow up.  Below is diagnosis and treatment.  Assessment and Plan:   Essential hypertension Discussed DASH (Dietary Approaches to Stop Hypertension) DASH diet is lower in sodium than a typical American diet. Cut back on foods that are high in saturated fat, cholesterol, and trans fats. Eat more whole-grain foods, fish, poultry, and nuts Remain active and exercise as tolerated daily.  Monitor BP at home-Call if greater than 130/80.  Check CMP/CBC  Hyperlipidemia, mixed Discussed lifestyle modifications. Recommended diet heavy in fruits and veggies, omega 3's. Decrease consumption of animal meats, cheeses, and dairy products. Remain active and exercise as tolerated. Continue to monitor. Check lipids/TSH  Coronary artery disease involving native coronary artery without angina pectoris, unspecified whether native or transplanted heart Continue to keep BP, BG and cholesterol well controlled Remain active as tolerated  Abnormal glucose Education: Reviewed 'ABCs' of diabetes management  Discussed goals to be met and/or maintained include A1C (<7) Blood pressure (<130/80) Cholesterol (LDL <70) Continue Eye Exam yearly  Continue Dental Exam Q6 mo Discussed dietary recommendations Discussed Physical Activity recommendations Check A1C  Vitamin D deficiency Continue supplement for goal of 60-100 Monitor Vitamin D levels  Gastroesophageal reflux disease, unspecified whether esophagitis present No suspected reflux complications (Barret/stricture). Lifestyle modification:  wt loss, avoid meals 2-3h before bedtime. Consider eliminating food triggers:  chocolate, caffeine, EtOH, acid/spicy food.  Medication management All medications discussed and reviewed in full. All questions and concerns regarding medications addressed.    Angina pectoris (HCC) Stable Continue to monitor  Pulmonary emphysema, unspecified emphysema type  (HCC)/ History of smoking 30 or more pack years (42) 12/2020 CT Chest Negative Continue annual screening  Tobacco abuse Smoking cessation instruction/counseling given:  counseled patient on the dangers of tobacco use, advised patient to stop smoking, and reviewed strategies to maximize success   BPH with obstruction/lower urinary tract symptoms Controlled Monitor PSA   Excessive drinking of alcohol Limit intake Monitor LFTs   Continue diet and meds as discussed. Further disposition pending results of labs. Discussed med's effects and SE's.   Over 30 minutes of exam, counseling, chart review, and critical decision making was performed.   Future Appointments  Date Time Provider Department Center  07/30/2022  2:30 PM Adela Glimpse, NP [facility] None  10/19/2022 10:00 AM Lucky Cowboy, MD [facility] None    ----------------------------------------------------------------------------------------------------------------------  HPI 62 y.o. male  presents for 3 month follow up on hypertension, cholesterol, diabetes, weight and vitamin D deficiency.   BMI is There is no height or weight on file to calculate BMI., he {HAS HAS ZOX:09604} been working on diet and exercise. Wt Readings from Last 3 Encounters:  04/17/22 191 lb 12.8 oz (87 kg)  02/27/22 189 lb 9.6 oz (86 kg)  02/06/22 190 lb (86.2 kg)   Has been treated for HTN since 2017.  In 2017, patient presented with ACS & had PCA & DES implanted (Dr Anne Fu). Patient has had no complaints of any cardiac type chest pain, palpitations, dyspnea / orthopnea / PND, dizziness, claudication, or dependent edema.  His blood pressure {HAS HAS NOT:18834} been controlled at home, today their BP is    He {DOES_DOES VWU:98119} workout. He denies chest pain, shortness of breath, dizziness.   He {ACTION; IS/IS JYN:[id number]} on cholesterol medication {Cholesterol meds:21887} and denies myalgias. His cholesterol {ACTION; IS/IS NOT:21021397} at goal.  The cholesterol last visit was:   Lab Results  Component Value Date   CHOL 167 04/17/2022  HDL 109 04/17/2022   LDLCALC 33 04/17/2022   TRIG 169 (H) 04/17/2022   CHOLHDL 1.5 04/17/2022    He {Has/has not:18111} been working on diet and exercise for prediabetes, and denies {Symptoms; diabetes w/o none:19199}. Last A1C in the office was:  Lab Results  Component Value Date   HGBA1C 5.8 (H) 04/17/2022   Patient is on Vitamin D supplement.   Lab Results  Component Value Date   VD25OH 38 04/17/2022        Current Medications:  Current Outpatient Medications on File Prior to Visit  Medication Sig   amLODipine (NORVASC) 10 MG tablet Take  1 tablet  Daily  for BP   aspirin EC 81 MG tablet Take 1 tablet (81 mg total) by mouth daily.   atenolol (TENORMIN) 100 MG tablet Takes  1/2 tablet  Daily for BP   atorvastatin (LIPITOR) 40 MG tablet TAKE 1 TABLET DAILY FOR CHOLESTEROL   Cholecalciferol 5000 units CHEW Chew 10,000 Units by mouth daily.   hydrochlorothiazide (HYDRODIURIL) 25 MG tablet Take 1 tablet (25 mg total) by mouth daily.   nitroGLYCERIN (NITROSTAT) 0.4 MG SL tablet Place 1 tablet (0.4 mg total) under the tongue every 5 (five) minutes as needed for chest pain.   olmesartan (BENICAR) 40 MG tablet Take  1 tablet  Daily  at Night  for BP  ( replaces Losartan)   pantoprazole (PROTONIX) 40 MG tablet TAKE 1 TABLET DAILY FOR ACID INDIGESTION & REFLUX   zinc gluconate 50 MG tablet Take 1 tablet (50 mg total) by mouth daily.   No current facility-administered medications on file prior to visit.     Allergies: No Known Allergies   Medical History:  Past Medical History:  Diagnosis Date   Coronary artery disease    a. Cath ~ 25 yrs ago, dx w/ pleurisy;  b. 07/2015 Cath/PCI: LM nl, LAD 28m (2.75x24 Synergy DES), LCX nl, RCA nl.   GERD (gastroesophageal reflux disease)    Hypertensive heart disease    Tobacco abuse    Family history- Reviewed and unchanged Social history-  Reviewed and unchanged   Review of Systems:  ROS    Physical Exam: There were no vitals taken for this visit. Wt Readings from Last 3 Encounters:  04/17/22 191 lb 12.8 oz (87 kg)  02/27/22 189 lb 9.6 oz (86 kg)  02/06/22 190 lb (86.2 kg)   General Appearance: Well nourished, in no apparent distress. Eyes: PERRLA, EOMs, conjunctiva no swelling or erythema Sinuses: No Frontal/maxillary tenderness ENT/Mouth: Ext aud canals clear, TMs without erythema, bulging. No erythema, swelling, or exudate on post pharynx.  Tonsils not swollen or erythematous. Hearing normal.  Neck: Supple, thyroid normal.  Respiratory: Respiratory effort normal, BS equal bilaterally without rales, rhonchi, wheezing or stridor.  Cardio: RRR with no MRGs. Brisk peripheral pulses without edema.  Abdomen: Soft, + BS.  Non tender, no guarding, rebound, hernias, masses. Lymphatics: Non tender without lymphadenopathy.  Musculoskeletal: Full ROM, 5/5 strength, {PSY - GAIT AND STATION:22860} gait Skin: Warm, dry without rashes, lesions, ecchymosis.  Neuro: Cranial nerves intact. No cerebellar symptoms.  Psych: Awake and oriented X 3, normal affect, Insight and Judgment appropriate.    Adela Glimpse, NP 2:04 PM [facility]

## 2022-08-08 ENCOUNTER — Encounter: Payer: Self-pay | Admitting: Nurse Practitioner

## 2022-08-08 ENCOUNTER — Ambulatory Visit: Payer: 59 | Admitting: Nurse Practitioner

## 2022-08-08 VITALS — BP 118/88 | HR 64 | Temp 98.1°F | Ht 75.0 in | Wt 190.2 lb

## 2022-08-08 DIAGNOSIS — E782 Mixed hyperlipidemia: Secondary | ICD-10-CM

## 2022-08-08 DIAGNOSIS — I10 Essential (primary) hypertension: Secondary | ICD-10-CM | POA: Diagnosis not present

## 2022-08-08 DIAGNOSIS — R7303 Prediabetes: Secondary | ICD-10-CM

## 2022-08-08 DIAGNOSIS — N401 Enlarged prostate with lower urinary tract symptoms: Secondary | ICD-10-CM

## 2022-08-08 DIAGNOSIS — E559 Vitamin D deficiency, unspecified: Secondary | ICD-10-CM

## 2022-08-08 DIAGNOSIS — I2510 Atherosclerotic heart disease of native coronary artery without angina pectoris: Secondary | ICD-10-CM | POA: Diagnosis not present

## 2022-08-08 DIAGNOSIS — R7309 Other abnormal glucose: Secondary | ICD-10-CM

## 2022-08-08 DIAGNOSIS — F1010 Alcohol abuse, uncomplicated: Secondary | ICD-10-CM

## 2022-08-08 DIAGNOSIS — Z79899 Other long term (current) drug therapy: Secondary | ICD-10-CM

## 2022-08-08 DIAGNOSIS — I209 Angina pectoris, unspecified: Secondary | ICD-10-CM

## 2022-08-08 DIAGNOSIS — J439 Emphysema, unspecified: Secondary | ICD-10-CM

## 2022-08-08 DIAGNOSIS — N138 Other obstructive and reflux uropathy: Secondary | ICD-10-CM

## 2022-08-08 DIAGNOSIS — Z720 Tobacco use: Secondary | ICD-10-CM

## 2022-08-08 DIAGNOSIS — K219 Gastro-esophageal reflux disease without esophagitis: Secondary | ICD-10-CM

## 2022-08-08 MED ORDER — NICOTINE 21 MG/24HR TD PT24
21.0000 mg | MEDICATED_PATCH | TRANSDERMAL | 0 refills | Status: AC
Start: 2022-08-08 — End: 2022-09-07

## 2022-08-08 NOTE — Progress Notes (Signed)
FOLLOW UP  Corey Collier was seen today for a follow up.  Below is diagnosis and treatment.  Assessment and Plan:   Essential hypertension Okay to stop Olmesartan given BP is well controlled without medication. Discussed restarting if BP increases consistently >130/80. Discussed DASH (Dietary Approaches to Stop Hypertension) DASH diet is lower in sodium than a typical American diet. Cut back on foods that are high in saturated fat, cholesterol, and trans fats. Eat more whole-grain foods, fish, poultry, and nuts Remain active and exercise as tolerated daily.  Monitor BP at home-Call if greater than 130/80.  Check CMP/CBC  Hyperlipidemia, mixed Discussed lifestyle modifications. Recommended diet heavy in fruits and veggies, omega 3's. Decrease consumption of animal meats, cheeses, and dairy products. Remain active and exercise as tolerated. Continue to monitor. Check lipids/TSH  Coronary artery disease involving native coronary artery without angina pectoris, unspecified whether native or transplanted heart Continue to keep BP, BG and cholesterol well controlled Remain active as tolerated  Abnormal glucose/Prediabetes Education: Reviewed 'ABCs' of diabetes management  Discussed goals to be met and/or maintained include A1C (<7) Blood pressure (<130/80) Cholesterol (LDL <70) Continue Eye Exam yearly  Continue Dental Exam Q6 mo Discussed dietary recommendations Discussed Physical Activity recommendations Check A1C  Vitamin D deficiency Continue supplement for goal of 60-100 Monitor Vitamin D levels  Gastroesophageal reflux disease, unspecified whether esophagitis present No suspected reflux complications (Barret/stricture). Lifestyle modification:  wt loss, avoid meals 2-3h before bedtime. Consider eliminating food triggers:  chocolate, caffeine, EtOH, acid/spicy food.  Medication management All medications discussed and reviewed in full. All questions and concerns  regarding medications addressed.    Angina pectoris (HCC) Stable Continue to monitor  Pulmonary emphysema, unspecified emphysema type (HCC)/ History of smoking 30 or more pack years (42) 12/2020 CT Chest Negative Continue annual screening 1PPD   Tobacco abuse Smoking cessation instruction/counseling given:  counseled patient on the dangers of tobacco use, advised patient to stop smoking, and reviewed strategies to maximize success   BPH with obstruction/lower urinary tract symptoms Controlled Monitor PSA   Excessive drinking of alcohol Limit intake Monitor LFTs  Orders Placed This Encounter  Procedures   CBC with Differential/Platelet   COMPLETE METABOLIC PANEL WITH GFR   Lipid panel   Hemoglobin A1c   VITAMIN D 25 Hydroxy (Vit-D Deficiency, Fractures)    Notify office for further evaluation and treatment, questions or concerns if any reported s/s fail to improve.   The patient was advised to call back or seek an in-person evaluation if any symptoms worsen or if the condition fails to improve as anticipated.   Further disposition pending results of labs. Discussed med's effects and SE's.    I discussed the assessment and treatment plan with the patient. The patient was provided an opportunity to ask questions and all were answered. The patient agreed with the plan and demonstrated an understanding of the instructions.  Discussed med's effects and SE's. Screening labs and tests as requested with regular follow-up as recommended.  I provided 25 minutes of face-to-face time during this encounter including counseling, chart review, and critical decision making was preformed.  Today's Plan of Care is based on a patient-centered health care approach known as shared decision making - the decisions, tests and treatments allow for patient preferences and values to be balanced with clinical evidence.     Future Appointments  Date Time Provider Department Center  10/19/2022  10:00 AM Corey Cowboy, MD [facility] None    ----------------------------------------------------------------------------------------------------------------------  HPI 62 y.o.  male  presents for 3 month follow up on hypertension, cholesterol, diabetes, weight and vitamin D deficiency.   Overall he reports feeling well today.    He does note increase in numbness in the bilateral feet.  He is a current every day smoker.  1.5 pack a day, for 40+ years.  Had low dose CT scan discussed performed 01/10/21- emphysema. Has a hx of prediabetes.  He also reports excessive alcohol use.  He admits to drinking 8 drinks "shots of hard liquor", drinks once he gets home from work, no urge to drink while working, hands don't shake. He does not feel ready to quit yet.   He reports hx of GERD which is currently managed by protonix 40 mg daily, failed attempt to taper with H2i (high dose ranitidine) he reports symptoms is currently well controlled, and denies breakthrough reflux, burning in chest, hoarseness or cough.     BMI is Body mass index is 23.77 kg/m., he has not been working on diet and exercise. Wt Readings from Last 3 Encounters:  08/08/22 190 lb 3.2 oz (86.3 kg)  04/17/22 191 lb 12.8 oz (87 kg)  02/27/22 189 lb 9.6 oz (86 kg)   Has been treated for HTN since 2017.  In 2017, patient presented with ACS & had PCA & DES implanted (Dr Anne Fu). Patient has had no complaints of any cardiac type chest pain, palpitations, dyspnea / orthopnea / PND, dizziness, claudication, or dependent edema.   His blood pressure has been controlled at home, today their BP is BP: 118/88 He has not taken his Olmesartan for several weeks and BP is well controlled.   He does not workout. He denies chest pain, shortness of breath, dizziness.   He is on cholesterol medication Atorvastatin and denies myalgias. His cholesterol is not at goal with elevated triglycerides mostly like to excessive alcohol intake. The cholesterol  last visit was:   Lab Results  Component Value Date   CHOL 167 04/17/2022   HDL 109 04/17/2022   LDLCALC 33 04/17/2022   TRIG 169 (H) 04/17/2022   CHOLHDL 1.5 04/17/2022    He has not been working on diet and exercise for prediabetes, and denies hypoglycemia , hypoglycemia  , polydipsia, and polyuria.  He has noticed increase in numbness and tingling. Last A1C in the office was:  Lab Results  Component Value Date   HGBA1C 5.8 (H) 04/17/2022   Patient is on Vitamin D supplement.   Lab Results  Component Value Date   VD25OH 38 04/17/2022        Current Medications:  Current Outpatient Medications on File Prior to Visit  Medication Sig   amLODipine (NORVASC) 10 MG tablet Take  1 tablet  Daily  for BP   aspirin EC 81 MG tablet Take 1 tablet (81 mg total) by mouth daily.   atenolol (TENORMIN) 100 MG tablet Takes  1/2 tablet  Daily for BP (Patient taking differently: Take 100 mg by mouth daily. Takes  1/2 tablet  Daily for BP)   atorvastatin (LIPITOR) 40 MG tablet TAKE 1 TABLET DAILY FOR CHOLESTEROL   Cholecalciferol 5000 units CHEW Chew 10,000 Units by mouth daily.   nitroGLYCERIN (NITROSTAT) 0.4 MG SL tablet Place 1 tablet (0.4 mg total) under the tongue every 5 (five) minutes as needed for chest pain.   olmesartan (BENICAR) 40 MG tablet Take  1 tablet  Daily  at Night  for BP  ( replaces Losartan)   pantoprazole (PROTONIX) 40 MG tablet TAKE  1 TABLET DAILY FOR ACID INDIGESTION & REFLUX   zinc gluconate 50 MG tablet Take 1 tablet (50 mg total) by mouth daily.   hydrochlorothiazide (HYDRODIURIL) 25 MG tablet Take 1 tablet (25 mg total) by mouth daily. (Patient not taking: Reported on 08/08/2022)   No current facility-administered medications on file prior to visit.     Allergies: Not on File   Medical History:  Past Medical History:  Diagnosis Date   Coronary artery disease    a. Cath ~ 25 yrs ago, dx w/ pleurisy;  b. 07/2015 Cath/PCI: LM nl, LAD 80m (2.75x24 Synergy DES), LCX  nl, RCA nl.   GERD (gastroesophageal reflux disease)    Hypertensive heart disease    Tobacco abuse    Family history- Reviewed and unchanged Social history- Reviewed and unchanged   Review of Systems: A complete ROS was performed with pertinent positives/negatives noted in the HPI. The remainder of the ROS are negative.  Physical Exam: BP 118/88   Pulse 64   Temp 98.1 F (36.7 C)   Ht 6\' 3"  (1.905 m)   Wt 190 lb 3.2 oz (86.3 kg)   SpO2 98%   BMI 23.77 kg/m  Wt Readings from Last 3 Encounters:  08/08/22 190 lb 3.2 oz (86.3 kg)  04/17/22 191 lb 12.8 oz (87 kg)  02/27/22 189 lb 9.6 oz (86 kg)   General Appearance: Well nourished, in no apparent distress. Eyes: PERRLA, EOMs, conjunctiva no swelling or erythema Sinuses: No Frontal/maxillary tenderness ENT/Mouth: Ext aud canals clear, TMs without erythema, bulging. No erythema, swelling, or exudate on post pharynx.  Tonsils not swollen or erythematous. Hearing normal.  Neck: Supple, thyroid normal.  Respiratory: Respiratory effort normal, BS equal bilaterally without rales, rhonchi, wheezing or stridor.  Cardio: RRR with no MRGs. Brisk peripheral pulses without edema.  Abdomen: Soft, + BS.  Non tender, no guarding, rebound, hernias, masses. Lymphatics: Non tender without lymphadenopathy.  Musculoskeletal: Full ROM, 5/5 strength, Normal gait Skin: Warm, dry without rashes, lesions, ecchymosis.  Neuro: Cranial nerves intact. No cerebellar symptoms.  Psych: Awake and oriented X 3, normal affect, Insight and Judgment appropriate.    Adela Glimpse, NP 4:02 PM [facility]

## 2022-08-09 LAB — CBC WITH DIFFERENTIAL/PLATELET
Absolute Monocytes: 546 cells/uL (ref 200–950)
Basophils Absolute: 63 cells/uL (ref 0–200)
Basophils Relative: 0.9 %
Eosinophils Absolute: 140 cells/uL (ref 15–500)
Eosinophils Relative: 2 %
HCT: 46.9 % (ref 38.5–50.0)
Hemoglobin: 16 g/dL (ref 13.2–17.1)
Lymphs Abs: 2786 cells/uL (ref 850–3900)
MCH: 33 pg (ref 27.0–33.0)
MCHC: 34.1 g/dL (ref 32.0–36.0)
MCV: 96.7 fL (ref 80.0–100.0)
MPV: 11.2 fL (ref 7.5–12.5)
Monocytes Relative: 7.8 %
Neutro Abs: 3465 cells/uL (ref 1500–7800)
Neutrophils Relative %: 49.5 %
Platelets: 181 10*3/uL (ref 140–400)
RBC: 4.85 10*6/uL (ref 4.20–5.80)
RDW: 13.9 % (ref 11.0–15.0)
Total Lymphocyte: 39.8 %
WBC: 7 10*3/uL (ref 3.8–10.8)

## 2022-08-09 LAB — COMPLETE METABOLIC PANEL WITH GFR
AG Ratio: 1.6 (calc) (ref 1.0–2.5)
ALT: 38 U/L (ref 9–46)
AST: 39 U/L — ABNORMAL HIGH (ref 10–35)
Albumin: 4.7 g/dL (ref 3.6–5.1)
Alkaline phosphatase (APISO): 100 U/L (ref 35–144)
BUN: 12 mg/dL (ref 7–25)
CO2: 30 mmol/L (ref 20–32)
Calcium: 10.1 mg/dL (ref 8.6–10.3)
Chloride: 103 mmol/L (ref 98–110)
Creat: 1.08 mg/dL (ref 0.70–1.35)
Globulin: 2.9 g/dL (calc) (ref 1.9–3.7)
Glucose, Bld: 97 mg/dL (ref 65–99)
Potassium: 4.2 mmol/L (ref 3.5–5.3)
Sodium: 141 mmol/L (ref 135–146)
Total Bilirubin: 0.9 mg/dL (ref 0.2–1.2)
Total Protein: 7.6 g/dL (ref 6.1–8.1)
eGFR: 78 mL/min/{1.73_m2} (ref >=60)

## 2022-08-09 LAB — HEMOGLOBIN A1C
Hgb A1c MFr Bld: 5.7 % of total Hgb — ABNORMAL HIGH (ref <5.7)
Mean Plasma Glucose: 117 mg/dL
eAG (mmol/L): 6.5 mmol/L

## 2022-08-09 LAB — LIPID PANEL
Cholesterol: 181 mg/dL (ref <200)
HDL: 99 mg/dL (ref >=40)
LDL Cholesterol (Calc): 62 mg/dL (calc)
Non-HDL Cholesterol (Calc): 82 mg/dL (calc) (ref <130)
Total CHOL/HDL Ratio: 1.8 (calc) (ref <5.0)
Triglycerides: 122 mg/dL (ref <150)

## 2022-08-09 LAB — VITAMIN D 25 HYDROXY (VIT D DEFICIENCY, FRACTURES): Vit D, 25-Hydroxy: 48 ng/mL (ref 30–100)

## 2022-08-13 NOTE — Patient Instructions (Signed)

## 2022-09-04 ENCOUNTER — Telehealth (HOSPITAL_BASED_OUTPATIENT_CLINIC_OR_DEPARTMENT_OTHER): Payer: Self-pay | Admitting: *Deleted

## 2022-09-04 ENCOUNTER — Ambulatory Visit: Payer: 59 | Admitting: Nurse Practitioner

## 2022-09-04 ENCOUNTER — Encounter: Payer: Self-pay | Admitting: Nurse Practitioner

## 2022-09-04 VITALS — BP 118/72 | HR 73 | Temp 97.6°F | Ht 76.0 in | Wt 189.6 lb

## 2022-09-04 DIAGNOSIS — I10 Essential (primary) hypertension: Secondary | ICD-10-CM

## 2022-09-04 DIAGNOSIS — R202 Paresthesia of skin: Secondary | ICD-10-CM

## 2022-09-04 DIAGNOSIS — R200 Anesthesia of skin: Secondary | ICD-10-CM

## 2022-09-04 DIAGNOSIS — M79604 Pain in right leg: Secondary | ICD-10-CM | POA: Diagnosis not present

## 2022-09-04 DIAGNOSIS — F1010 Alcohol abuse, uncomplicated: Secondary | ICD-10-CM

## 2022-09-04 DIAGNOSIS — Z720 Tobacco use: Secondary | ICD-10-CM

## 2022-09-04 DIAGNOSIS — M79605 Pain in left leg: Secondary | ICD-10-CM

## 2022-09-04 DIAGNOSIS — Z79899 Other long term (current) drug therapy: Secondary | ICD-10-CM

## 2022-09-04 NOTE — Telephone Encounter (Signed)
Called office for insurance prior authorization for the VAS [place] ABI w/wo TBI ordered by Tobey Bride, NP---officeto update order note

## 2022-09-04 NOTE — Progress Notes (Signed)
FOLLOW UP  Assessment and Plan:   Primary hypertension Controlled Continue Amlodipine, Atenolol Discussed DASH (Dietary Approaches to Stop Hypertension) DASH diet is lower in sodium than a typical American diet. Cut back on foods that are high in saturated fat, cholesterol, and trans fats. Eat more whole-grain foods, fish, poultry, and nuts Remain active and exercise as tolerated daily.  Monitor BP at home-Call if greater than 130/80.   - VAS [place] ABI WITH/WO TBI; Future  Tobacco abuse Smoking cessation instruction/counseling given:  counseled patient on the dangers of tobacco use, advised patient to stop smoking, and reviewed strategies to maximize success Trying to quit - Nicotine Patches on hand for use Continue to monitor  - VAS [place] ABI WITH/WO TBI; Future  Excessive drinking of alcohol Review of Vitamin B12 levels WNL but low Suggest OTC B12 supplement Decrease intake of daily/weekly alcohol consumption  - VAS [place] ABI WITH/WO TBI; Future  Numbness and tingling of both legs/leg pain Assess for PAD/PVD with ABIs Discussed possible referral to Vascular pending results. Defer treatment with gabapentin at this time Stop smoking Continue to monitor  - VAS [place] ABI WITH/WO TBI; Future  Medication management All medications discussed and reviewed in full. All questions and concerns regarding medications addressed.     Notify office for further evaluation and treatment, questions or concerns if any reported s/s fail to improve.   The patient was advised to call back or seek an in-person evaluation if any symptoms worsen or if the condition fails to improve as anticipated.   Further disposition pending results of labs. Discussed med's effects and SE's.    I discussed the assessment and treatment plan with the patient. The patient was provided an opportunity to ask questions and all were answered. The patient agreed with the plan and demonstrated an understanding of the  instructions.  Discussed med's effects and SE's. Screening labs and tests as requested with regular follow-up as recommended.  I provided 20 minutes of face-to-face time during this encounter including counseling, chart review, and critical decision making was preformed.  Today's Plan of Care is based on a patient-centered health care approach known as shared decision making - the decisions, tests and treatments allow for patient preferences and values to be balanced with clinical evidence.     Future Appointments  Date Time Provider Department Center  09/04/2022 10:30 AM Adela Glimpse, NP [facility] None  11/20/2022 10:00 AM Lucky Cowboy, MD [facility] None    ----------------------------------------------------------------------------------------------------------------------  HPI 62 y.o. male  presents for a general office visit with complains of BLE numbness, tingling and pain that has been increasing noticeable over the last 2 months.   He is a current every day smoker.  Started 1979 with 1 PPD, total pack per yeas is 42.  He is trying to stop smoking.  Has nicotine patches on hand, does not use, however, receptive to using and cutting down cigarette use.   His blood pressure has been controlled at home, today their BP is    He does not workout.  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 He denies chest pain, shortness of breath, dizziness.   He is was on on cholesterol medication, stopped thought to be contributing to myalgias, however no relief.  He was taking Atorvastatin and denies myalgias. His cholesterol is at goal. The cholesterol last visit was:   Lab Results  Component Value Date   CHOL 181 08/08/2022   HDL 99 08/08/2022   LDLCALC  62 08/08/2022   TRIG 122 08/08/2022   CHOLHDL 1.8 08/08/2022    He has been working on diet and exercise for prediabetes, and denies polydipsia and polyuria. Last A1C in the office was:  Lab Results  Component  Value Date   HGBA1C 5.7 (H) 08/08/2022      Current Medications:  Current Outpatient Medications on File Prior to Visit  Medication Sig   amLODipine (NORVASC) 10 MG tablet Take  1 tablet  Daily  for BP   aspirin EC 81 MG tablet Take 1 tablet (81 mg total) by mouth daily.   atenolol (TENORMIN) 100 MG tablet Takes  1/2 tablet  Daily for BP (Patient taking differently: Take 100 mg by mouth daily. Takes  1/2 tablet  Daily for BP)   atorvastatin (LIPITOR) 40 MG tablet TAKE 1 TABLET DAILY FOR CHOLESTEROL   Cholecalciferol 5000 units CHEW Chew 10,000 Units by mouth daily.   hydrochlorothiazide (HYDRODIURIL) 25 MG tablet Take 1 tablet (25 mg total) by mouth daily. (Patient not taking: Reported on 08/08/2022)   nicotine (NICODERM CQ - DOSED IN MG/24 HOURS) 21 mg/24hr patch Place 1 patch (21 mg total) onto the skin daily.   nitroGLYCERIN (NITROSTAT) 0.4 MG SL tablet Place 1 tablet (0.4 mg total) under the tongue every 5 (five) minutes as needed for chest pain.   olmesartan (BENICAR) 40 MG tablet Take  1 tablet  Daily  at Night  for BP  ( replaces Losartan)   pantoprazole (PROTONIX) 40 MG tablet TAKE 1 TABLET DAILY FOR ACID INDIGESTION & REFLUX   zinc gluconate 50 MG tablet Take 1 tablet (50 mg total) by mouth daily.   No current facility-administered medications on file prior to visit.     Allergies: Not on File   Medical History:  Past Medical History:  Diagnosis Date   Coronary artery disease    a. Cath ~ 25 yrs ago, dx w/ pleurisy;  b. 07/2015 Cath/PCI: LM nl, LAD 55m (2.75x24 Synergy DES), LCX nl, RCA nl.   GERD (gastroesophageal reflux disease)    Hypertensive heart disease    Tobacco abuse    Family history- Reviewed and unchanged Social history- Reviewed and unchanged   Review of Systems:  ROS    Physical Exam: There were no vitals taken for this visit. Wt Readings from Last 3 Encounters:  08/08/22 190 lb 3.2 oz (86.3 kg)  04/17/22 191 lb 12.8 oz (87 kg)  02/27/22 189 lb  9.6 oz (86 kg)   General Appearance: Well nourished, in no apparent distress. Eyes: PERRLA, EOMs, conjunctiva no swelling or erythema Sinuses: No Frontal/maxillary tenderness ENT/Mouth: Ext aud canals clear, TMs without erythema, bulging. No erythema, swelling, or exudate on post pharynx.  Tonsils not swollen or erythematous. Hearing normal.  Neck: Supple, thyroid normal.  Respiratory: Respiratory effort normal, BS equal bilaterally without rales, rhonchi, wheezing or stridor.  Cardio: RRR with no MRGs. Brisk peripheral pulses without edema.  Abdomen: Soft, + BS.  Non tender, no guarding, rebound, hernias, masses. Lymphatics: Non tender without lymphadenopathy.  Musculoskeletal: Full ROM, 5/5 strength, Normal gait Skin: Warm, dry without rashes, lesions, ecchymosis.  Neuro: Cranial nerves intact. No cerebellar symptoms.  Psych: Awake and oriented X 3, normal affect, Insight and Judgment appropriate.    Adela Glimpse, NP 9:05 AM [facility]

## 2022-09-05 ENCOUNTER — Telehealth: Payer: Self-pay | Admitting: Nurse Practitioner

## 2022-09-05 ENCOUNTER — Other Ambulatory Visit: Payer: Self-pay | Admitting: Internal Medicine

## 2022-09-05 DIAGNOSIS — K219 Gastro-esophageal reflux disease without esophagitis: Secondary | ICD-10-CM

## 2022-09-05 DIAGNOSIS — I10 Essential (primary) hypertension: Secondary | ICD-10-CM

## 2022-09-05 MED ORDER — PANTOPRAZOLE SODIUM 40 MG PO TBEC
DELAYED_RELEASE_TABLET | ORAL | 3 refills | Status: DC
Start: 2022-09-05 — End: 2023-06-17

## 2022-09-05 NOTE — Telephone Encounter (Signed)
Pt. Is requesting refill on Pantoprazole. Pls send to [facility] on file.

## 2022-09-05 NOTE — Addendum Note (Signed)
Addended by: Dionicio Stall on: 09/05/2022 12:09 PM   Modules accepted: Orders

## 2022-09-13 ENCOUNTER — Encounter: Payer: 59 | Admitting: Internal Medicine

## 2022-10-08 ENCOUNTER — Ambulatory Visit (HOSPITAL_BASED_OUTPATIENT_CLINIC_OR_DEPARTMENT_OTHER): Payer: 59

## 2022-10-08 DIAGNOSIS — Z720 Tobacco use: Secondary | ICD-10-CM

## 2022-10-08 DIAGNOSIS — F1010 Alcohol abuse, uncomplicated: Secondary | ICD-10-CM

## 2022-10-08 DIAGNOSIS — M79605 Pain in left leg: Secondary | ICD-10-CM

## 2022-10-08 DIAGNOSIS — I10 Essential (primary) hypertension: Secondary | ICD-10-CM

## 2022-10-08 DIAGNOSIS — M79604 Pain in right leg: Secondary | ICD-10-CM

## 2022-10-08 DIAGNOSIS — R202 Paresthesia of skin: Secondary | ICD-10-CM

## 2022-10-08 DIAGNOSIS — R200 Anesthesia of skin: Secondary | ICD-10-CM | POA: Diagnosis not present

## 2022-10-08 LAB — VAS US ABI WITH/WO TBI: Right ABI: 1.08

## 2022-10-10 LAB — VAS US ABI WITH/WO TBI: Left ABI: 1.1

## 2022-10-19 ENCOUNTER — Encounter: Payer: 59 | Admitting: Internal Medicine

## 2022-11-10 ENCOUNTER — Other Ambulatory Visit: Payer: Self-pay | Admitting: Internal Medicine

## 2022-11-10 DIAGNOSIS — I10 Essential (primary) hypertension: Secondary | ICD-10-CM

## 2022-11-19 ENCOUNTER — Encounter: Payer: Self-pay | Admitting: Internal Medicine

## 2022-11-19 DIAGNOSIS — Z8249 Family history of ischemic heart disease and other diseases of the circulatory system: Secondary | ICD-10-CM | POA: Insufficient documentation

## 2022-11-19 NOTE — Patient Instructions (Signed)

## 2022-11-19 NOTE — Progress Notes (Signed)
Annual  Screening/Preventative Visit  & Comprehensive Evaluation & Examination   Future Appointments  Date Time Provider Department  11/20/2022                    cpe 10:00 AM Lucky Cowboy, MD [facility]  12/02/2023                      cpe 10:00 AM Lucky Cowboy, MD [facility]            This very nice 62 y.o. separated WM with HTN, HLD, Prediabetes and Vitamin D Deficiency presents for a Screening /Preventative Visit & comprehensive evaluation and management of multiple medical co-morbidities.  Patient has GERD controlled on his meds.            Last Oct, 2021 and Oct 2022 -  patient had a LD screening Chest CT scan due to his 40 + pack year smoking hx of between 1-1.5  ppd cigarettes. I discussed lung cancer screening with him.   He was agreeable to undergo another screening low dose CT scan of the chest.  We discussed smoking cessation techniques/options. I will refer him her for a LDCT lung scan & discussed  lung cancer screening program.        HTN predates since  2017.  Patient's BP has been controlled at home.  Today's BP is  at goal - 140/84 .  In 2017,  patient presented with ACS & had PCA & DES implanted (Dr Anne Fu).  Patient denies any cardiac symptoms as chest pain, palpitations, shortness of breath, dizziness or ankle swelling.        Patient's hyperlipidemia is controlled with diet and Atorvastatin. Patient denies myalgias or other medication SE's. Last lipids were  at goal:  Lab Results  Component Value Date   CHOL 181 08/08/2022   HDL 99 08/08/2022   LDLCALC 62 08/08/2022   TRIG 122 08/08/2022   CHOLHDL 1.8 08/08/2022         Patient is monitored expectantly  for glucose intolerance  and patient denies reactive hypoglycemic symptoms, visual blurring, diabetic polys or paresthesias. Last A1c was  near goal:   Lab Results  Component Value Date   HGBA1C 5.7 (H) 08/08/2022         Finally, patient has history of Vitamin D Deficiency ("36" /2018) and  last vitamin D was low (goal is 70-100):   Lab Results  Component Value Date   VD25OH 48 08/08/2022       Current Outpatient Medications  Medication Instructions   amLODipine 10 MG tablet TAKE 1 TABLET DAILY    aspirin EC  81 mg Daily   atenolol  100 MG tablet Take  1 tablet  Daily    atorvastatin  40 MG tablet TAKE 1 TABLET DAILY   Vitamin  D   10,000 Units  Daily   hydrochlorothiazide  25 mg Daily   NITROSTAT   0.4 mg  Sl  Every 5 min PRN   olmesartan 40 MG tablet Take  1 tablet  Daily     pantoprazole 40 MG tablet TAKE 1 TABLET DAILY   zinc   50 mg Daily     Past Medical History:  Diagnosis Date   Coronary artery disease    a. Cath ~ 25 yrs ago, dx w/ pleurisy;  b. 07/2015 Cath/PCI: LM nl, LAD 30m (2.75x24 Synergy DES), LCX nl, RCA nl.   GERD (gastroesophageal reflux disease)  Hypertensive heart disease    Tobacco abuse      Health Maintenance  Topic Date Due   COVID-19 Vaccine (1) Never done   Pneumococcal Vaccine 8-31 Years old (1 - PCV) Never done   HIV Screening  Never done   Hepatitis C Screening  Never done   Zoster Vaccines- Shingrix (1 of 2) Never done   COLONOSCOPY  Never done   INFLUENZA VACCINE  10/24/2020   TETANUS/TDAP  01/24/2026   HPV VACCINES  Aged Out     Immunization History  Administered Date(s) Administered   PPD Test 05/28/2017, 08/27/2018, 09/09/2019   Tdap 01/25/2016    Cologard  - 09/24/2019  - Negative - 3 year f/u due July 2024 - overdue   Past Surgical History:  Procedure Laterality Date   BACK SURGERY     CARDIAC CATHETERIZATION N/A 08/11/2015   Procedure: Left Heart Cath and Coronary Angiography;  Surgeon: Jake Bathe, MD;  Location: [facility];  Service: Cardiovascular;  Laterality: N/A;   CARDIAC CATHETERIZATION N/A 08/11/2015   Procedure: Coronary Stent Intervention;  Surgeon: Jake Bathe, MD;  Location: [facility];  Service: Cardiovascular;  Laterality: N/A;   CARDIAC CATHETERIZATION N/A 08/11/2015    Procedure: Coronary Stent Intervention;  Surgeon: Corky Crafts, MD;  Location: [facility];  Service: Cardiovascular;  Laterality: N/A;   LUMBAR DISC SURGERY       Family History  Problem Relation Age of Onset   Stroke Father     Social History   Socioeconomic History   Marital status: Legally Separated    Spouse name:    Number of children: Not on file  Occupational History   Not on file  Tobacco Use   Smoking status: Every Day    Packs/day: 1.00    Years: 42.00    Pack years: 42.00    Types: Cigarettes    Start date: 1979   Smokeless tobacco: Never   Tobacco comments:    Currently 1.5 pack/day   Substance and Sexual Activity   Alcohol use: Yes    Alcohol/week: 42.0 standard drinks    Types: 42 Shots of liquor per week    Comment: 08/11/2015 drinks 3-4 fifths of liquor a week   Drug use: No   Sexual activity: Not on file    ROS Constitutional: Denies fever, chills, weight loss/gain, headaches, insomnia,  night sweats or change in appetite. Does c/o fatigue. Eyes: Denies redness, blurred vision, diplopia, discharge, itchy or watery eyes.  ENT: Denies discharge, congestion, post nasal drip, epistaxis, sore throat, earache, hearing loss, dental pain, Tinnitus, Vertigo, Sinus pain or snoring.  Cardio: Denies chest pain, palpitations, irregular heartbeat, syncope, dyspnea, diaphoresis, orthopnea, PND, claudication or edema Respiratory: denies cough, dyspnea, DOE, pleurisy, hoarseness, laryngitis or wheezing.  Gastrointestinal: Denies dysphagia, heartburn, reflux, water brash, pain, cramps, nausea, vomiting, bloating, diarrhea, constipation, hematemesis, melena, hematochezia, jaundice or hemorrhoids Genitourinary: Denies dysuria, frequency, urgency, nocturia, hesitancy, discharge, hematuria or flank pain Musculoskeletal: Denies arthralgia, myalgia, stiffness, Jt. Swelling, pain, limp or strain/sprain. Denies Falls. Skin: Denies puritis, rash, hives, warts, acne,  eczema or change in skin lesion Neuro: No weakness, tremor, incoordination, spasms, paresthesia or pain Psychiatric: Denies confusion, memory loss or sensory loss. Denies Depression. Endocrine: Denies change in weight, skin, hair change, nocturia, and paresthesia, diabetic polys, visual blurring or hyper / hypo glycemic episodes.  Heme/Lymph: No excessive bleeding, bruising or enlarged lymph nodes.   Physical Exam  BP (!) 140/84  Pulse (!) 51   Temp 97.9 F (36.6 C)   Resp 16   Ht 6\' 3"  (1.905 m)   Wt 188 lb 9.6 oz (85.5 kg)   SpO2 98%   BMI 23.57 kg/m   General Appearance: Well nourished and well groomed and in no apparent distress.  Eyes: PERRLA, EOMs, conjunctiva no swelling or erythema, normal fundi and vessels. Sinuses: No frontal/maxillary tenderness ENT/Mouth: EACs patent / TMs  nl. Nares clear without erythema, swelling, mucoid exudates. Oral hygiene is good. No erythema, swelling, or exudate. Tongue normal, non-obstructing. Tonsils not swollen or erythematous. Hearing normal.  Neck: Supple, thyroid not palpable. No bruits, nodes or JVD. Respiratory: Respiratory effort normal.  BS equal and clear bilateral without rales, rhonci, wheezing or stridor. Cardio: Heart sounds are normal with regular rate and rhythm and no murmurs, rubs or gallops. Peripheral pulses are normal and equal bilaterally without edema. No aortic or femoral bruits. Chest: symmetric with normal excursions and percussion.  Abdomen: Soft, with Nl bowel sounds. Nontender, no guarding, rebound, hernias, masses, or organomegaly.  Lymphatics: Non tender without lymphadenopathy.  Musculoskeletal: Full ROM all peripheral extremities, joint stability, 5/5 strength, and normal gait. Skin: Warm and dry without rashes, lesions, cyanosis, clubbing or  ecchymosis.  Neuro: Cranial nerves intact, reflexes equal bilaterally. Normal muscle tone, no cerebellar symptoms. Sensation intact.  Pysch: Alert and oriented X 3 with  normal affect, insight and judgment appropriate.   Assessment and Plan  1. Annual Preventative/Screening Exam    2. Essential hypertension  - EKG 12-Lead - [place], RETROPERITNL ABD,  LTD - Urinalysis, Routine w reflex microscopic - Microalbumin / creatinine urine ratio - CBC with Differential/Platelet - COMPLETE METABOLIC PANEL WITH GFR - Magnesium - TSH   3. Hyperlipidemia, mixed  - EKG 12-Lead - [place], RETROPERITNL ABD,  LTD - Lipid panel - TSH   4. Abnormal glucose  - EKG 12-Lead - [place], RETROPERITNL ABD,  LTD - Hemoglobin A1c - Insulin, random   5. Vitamin D deficiency  - VITAMIN D 25 Hydroxy    6. Coronary artery disease involving native coronary artery without                     angina pectoris, unspecified whether native or transplanted heart  - EKG 12-Lead - Lipid panel   7. Aortic atherosclerosis (HCC) by CT scan 12/2019  - EKG 12-Lead - [place], RETROPERITNL ABD,  LTD - Lipid panel   8. Screening for colorectal cancer  - Cologuard   9. Testosterone deficiency  - Testosterone   10. Encounter for screening for lung cancer  - CT CHEST LUNG CANCER SCREENING LOW DOSE WO CONTRAST; Future   11. Screening for prostate cancer  - PSA   12. Screening-pulmonary TB  - TB Skin Test   13. Screening for heart disease  - EKG 12-Lead   14. FHx: cardiovascular disease  - EKG 12-Lead - [place], RETROPERITNL ABD,  LTD   15. Smoker  - EKG 12-Lead - [place], RETROPERITNL ABD,  LTD   16. Screening for AAA (aortic abdominal aneurysm)  - [place], RETROPERITNL ABD,  LTD   17. Fatigue, unspecified type  - Iron, Total/Total Iron Binding Cap - Vitamin B12 - CBC with Differential/Platelet - TSH   18. Medication management  - Urinalysis, Routine w reflex microscopic - Testosterone - CBC with Differential/Platelet - COMPLETE METABOLIC PANEL WITH GFR - Magnesium - Lipid panel - TSH - Hemoglobin A1c - Insulin, random - VITAMIN D 25 Hydroxy  Patient was counseled in prudent diet, weight control to achieve/maintain BMI less than 25, BP monitoring, regular exercise and medications as discussed.  Discussed med effects and SE's. Routine screening labs and tests as requested with regular follow-up as recommended. Over 40 minutes of exam, counseling, chart review and high complex critical decision making was performed   Marinus Maw, MD

## 2022-11-20 ENCOUNTER — Encounter: Payer: Self-pay | Admitting: Internal Medicine

## 2022-11-20 ENCOUNTER — Ambulatory Visit (INDEPENDENT_AMBULATORY_CARE_PROVIDER_SITE_OTHER): Payer: 59 | Admitting: Internal Medicine

## 2022-11-20 VITALS — BP 140/84 | HR 51 | Temp 97.9°F | Resp 16 | Ht 75.0 in | Wt 188.6 lb

## 2022-11-20 DIAGNOSIS — Z136 Encounter for screening for cardiovascular disorders: Secondary | ICD-10-CM

## 2022-11-20 DIAGNOSIS — I2510 Atherosclerotic heart disease of native coronary artery without angina pectoris: Secondary | ICD-10-CM | POA: Diagnosis not present

## 2022-11-20 DIAGNOSIS — I10 Essential (primary) hypertension: Secondary | ICD-10-CM

## 2022-11-20 DIAGNOSIS — Z125 Encounter for screening for malignant neoplasm of prostate: Secondary | ICD-10-CM

## 2022-11-20 DIAGNOSIS — Z111 Encounter for screening for respiratory tuberculosis: Secondary | ICD-10-CM

## 2022-11-20 DIAGNOSIS — Z0000 Encounter for general adult medical examination without abnormal findings: Secondary | ICD-10-CM

## 2022-11-20 DIAGNOSIS — Z1211 Encounter for screening for malignant neoplasm of colon: Secondary | ICD-10-CM

## 2022-11-20 DIAGNOSIS — R7309 Other abnormal glucose: Secondary | ICD-10-CM

## 2022-11-20 DIAGNOSIS — Z122 Encounter for screening for malignant neoplasm of respiratory organs: Secondary | ICD-10-CM

## 2022-11-20 DIAGNOSIS — Z79899 Other long term (current) drug therapy: Secondary | ICD-10-CM

## 2022-11-20 DIAGNOSIS — Z0001 Encounter for general adult medical examination with abnormal findings: Secondary | ICD-10-CM

## 2022-11-20 DIAGNOSIS — F17200 Nicotine dependence, unspecified, uncomplicated: Secondary | ICD-10-CM

## 2022-11-20 DIAGNOSIS — I700 Atherosclerosis of aorta: Secondary | ICD-10-CM

## 2022-11-20 DIAGNOSIS — R5383 Other fatigue: Secondary | ICD-10-CM

## 2022-11-20 DIAGNOSIS — E349 Endocrine disorder, unspecified: Secondary | ICD-10-CM

## 2022-11-20 DIAGNOSIS — Z8249 Family history of ischemic heart disease and other diseases of the circulatory system: Secondary | ICD-10-CM

## 2022-11-20 DIAGNOSIS — E559 Vitamin D deficiency, unspecified: Secondary | ICD-10-CM

## 2022-11-20 DIAGNOSIS — E782 Mixed hyperlipidemia: Secondary | ICD-10-CM

## 2022-11-21 ENCOUNTER — Other Ambulatory Visit: Payer: Self-pay | Admitting: Internal Medicine

## 2022-11-21 DIAGNOSIS — I10 Essential (primary) hypertension: Secondary | ICD-10-CM

## 2022-11-21 LAB — COMPLETE METABOLIC PANEL WITH GFR
AG Ratio: 1.7 (calc) (ref 1.0–2.5)
ALT: 28 U/L (ref 9–46)
AST: 22 U/L (ref 10–35)
Albumin: 4.8 g/dL (ref 3.6–5.1)
Alkaline phosphatase (APISO): 89 U/L (ref 35–144)
BUN: 14 mg/dL (ref 7–25)
CO2: 30 mmol/L (ref 20–32)
Calcium: 10.5 mg/dL — ABNORMAL HIGH (ref 8.6–10.3)
Chloride: 102 mmol/L (ref 98–110)
Creat: 1.01 mg/dL (ref 0.70–1.35)
Globulin: 2.8 g/dL (ref 1.9–3.7)
Glucose, Bld: 95 mg/dL (ref 65–99)
Potassium: 4.7 mmol/L (ref 3.5–5.3)
Sodium: 140 mmol/L (ref 135–146)
Total Bilirubin: 0.9 mg/dL (ref 0.2–1.2)
Total Protein: 7.6 g/dL (ref 6.1–8.1)
eGFR: 85 mL/min/{1.73_m2} (ref >=60)

## 2022-11-21 LAB — VITAMIN D 25 HYDROXY (VIT D DEFICIENCY, FRACTURES): Vit D, 25-Hydroxy: 57 ng/mL (ref 30–100)

## 2022-11-21 LAB — CBC WITH DIFFERENTIAL/PLATELET
Absolute Monocytes: 587 {cells}/uL (ref 200–950)
Basophils Absolute: 62 {cells}/uL (ref 0–200)
Basophils Relative: 0.9 %
Eosinophils Absolute: 152 {cells}/uL (ref 15–500)
Eosinophils Relative: 2.2 %
HCT: 48.9 % (ref 38.5–50.0)
Hemoglobin: 16.8 g/dL (ref 13.2–17.1)
Lymphs Abs: 2305 {cells}/uL (ref 850–3900)
MCH: 33.4 pg — ABNORMAL HIGH (ref 27.0–33.0)
MCHC: 34.4 g/dL (ref 32.0–36.0)
MCV: 97.2 fL (ref 80.0–100.0)
MPV: 11.5 fL (ref 7.5–12.5)
Monocytes Relative: 8.5 %
Neutro Abs: 3795 {cells}/uL (ref 1500–7800)
Neutrophils Relative %: 55 %
Platelets: 207 10*3/uL (ref 140–400)
RBC: 5.03 10*6/uL (ref 4.20–5.80)
RDW: 13 % (ref 11.0–15.0)
Total Lymphocyte: 33.4 %
WBC: 6.9 10*3/uL (ref 3.8–10.8)

## 2022-11-21 LAB — HEMOGLOBIN A1C
Hgb A1c MFr Bld: 5.8 %{Hb} — ABNORMAL HIGH (ref <5.7)
Mean Plasma Glucose: 120 mg/dL
eAG (mmol/L): 6.6 mmol/L

## 2022-11-21 LAB — URINALYSIS, ROUTINE W REFLEX MICROSCOPIC
Bilirubin Urine: NEGATIVE
Glucose, UA: NEGATIVE
Hgb urine dipstick: NEGATIVE
Ketones, ur: NEGATIVE
Leukocytes,Ua: NEGATIVE
Nitrite: NEGATIVE
Protein, ur: NEGATIVE
Specific Gravity, Urine: 1.021 (ref 1.001–1.035)
pH: 5.5 (ref 5.0–8.0)

## 2022-11-21 LAB — LIPID PANEL
Cholesterol: 196 mg/dL (ref <200)
HDL: 94 mg/dL (ref >=40)
LDL Cholesterol (Calc): 75 mg/dL
Non-HDL Cholesterol (Calc): 102 mg/dL (ref <130)
Total CHOL/HDL Ratio: 2.1 (calc) (ref <5.0)
Triglycerides: 171 mg/dL — ABNORMAL HIGH (ref <150)

## 2022-11-21 LAB — MICROALBUMIN / CREATININE URINE RATIO
Creatinine, Urine: 160 mg/dL (ref 20–320)
Microalb Creat Ratio: 4 mg/g{creat} (ref <30)
Microalb, Ur: 0.6 mg/dL

## 2022-11-21 LAB — TSH: TSH: 0.85 m[IU]/L (ref 0.40–4.50)

## 2022-11-21 LAB — INSULIN, RANDOM: Insulin: 6.7 u[IU]/mL

## 2022-11-21 LAB — IRON, TOTAL/TOTAL IRON BINDING CAP
%SAT: 47 % (ref 20–48)
Iron: 166 ug/dL (ref 50–180)
TIBC: 350 ug/dL (ref 250–425)

## 2022-11-21 LAB — PSA: PSA: 0.46 ng/mL (ref <=4.00)

## 2022-11-21 LAB — VITAMIN B12: Vitamin B-12: 316 pg/mL (ref 200–1100)

## 2022-11-21 LAB — TESTOSTERONE: Testosterone: 252 ng/dL (ref 250–827)

## 2022-11-21 LAB — MAGNESIUM: Magnesium: 2.1 mg/dL (ref 1.5–2.5)

## 2022-11-21 MED ORDER — OLMESARTAN MEDOXOMIL 40 MG PO TABS
ORAL_TABLET | ORAL | 3 refills | Status: DC
Start: 2022-11-21 — End: 2023-06-17

## 2022-11-21 NOTE — Progress Notes (Signed)
^<^<^<^<^<^<^<^<^<^<^<^<^<^<^<^<^<^<^<^<^<^<^<^<^<^<^<^<^<^<^<^<^<^<^<^<^ ^>^>^>^>^>^>^>^>^>^>^>>^>^>^>^>^>^>^>^>^>^>^>^>^>^>^>^>^>^>^>^>^>^>^>^>^>  - A1c = 5.8% - still borderline elevated 12 week average blood sugar , So   - Avoid Sweets, Candy & White Stuff   - White Rice, White Potatoes, White Flour  - Breads &  Pasta  ^<^<^<^<^<^<^<^<^<^<^<^<^<^<^<^<^<^<^<^<^<^<^<^<^<^<^<^<^<^<^<^<^<^<^<^<^ ^>^>^>^>^>^>^>^>^>^>^>^>^>^>^>^>^>^>^>^>^>^>^>^>^>^>^>^>^>^>^>^>^>^>^>^>^  - Chol = 196  & LDL 75 and HDL 94 - All  Excellent   - Very low risk for Heart Attack  / Stroke  ^>^>^>^>^>^>^>^>^>^>^>^>^>^>^>^>^>^>^>^>^>^>^>^>^>^>^>^>^>^>^>^>^>^>^>^>^ ^>^>^>^>^>^>^>^>^>^>^>^>^>^>^>^>^>^>^>^>^>^>^>^>^>^>^>^>^>^>^>^>^>^>^>^>^  - Iron levels Normal , But    -  Vitamin B12 =  316  is   Very Low  (Ideal or Goal Vit B12 is between 450 - 1,100)   Low Vit B12 may be associated with Anemia , Fatigue,   Peripheral Neuropathy, Dementia, "Brain Fog", & Depression  - Recommend take a sub-lingual form of Vitamin B12 tablet   1,000 to 5,000 mcg tab that you dissolve under your tongue /Daily   - Can get Lavonia Dana - best price at [employer] or on [employer]  ^<^<^<^<^<^<^<^<^<^<^<^<^<^<^<^<^<^<^<^<^<^<^<^<^<^<^<^<^<^<^<^<^<^<^<^<^ ^>^>^>^>^>^>^>^>^>^>^>^>^>^>^>^>^>^>^>^>^>^>^>^>^>^>^>^>^>^>^>^>^>^>^>^>^  - PSA - very low - So No Prostate Cancer  - Great !  ^<^<^<^<^<^<^<^<^<^<^<^<^<^<^<^<^<^<^<^<^<^<^<^<^<^<^<^<^<^<^<^<^<^<^<^<^ ^>^>^>^>^>^>^>^>^>^>^>^>^>^>^>^>^>^>^>^>^>^>^>^>^>^>^>^>^>^>^>^>^>^>^>^>^  - Testosterone slightly low Normal ,   So be sure to take your Zinc 50 mg which helps raise Testosterone levels naturally                                                                                          ( as does regular exercise )   ^<^<^<^<^<^<^<^<^<^<^<^<^<^<^<^<^<^<^<^<^<^<^<^<^<^<^<^<^<^<^<^<^<^<^<^<^ ^>^>^>^>^>^>^>^>^>^>^>^>^>^>^>^>^>^>^>^>^>^>^>^>^>^>^>^>^>^>^>^>^>^>^>^>^  - Vitamin DF =  57 - is low   - Vitamin D goal is between 70-100.   - Please make sure that    you are taking your Vitamin D 10,000 units / day as recommended !   - It is very important as a natural anti-inflammatory and helping the                           immune system protect against viral infections, like the Covid-19    helping hair, skin, and nails, as well as reducing stroke and heart attack risk.   - It helps your bones and helps with mood.  - It also decreases numerous cancer risks so please                                                                                           take it as directed.   - Low Vit D is associated with a 200-300% higher risk for CANCER   and 200-300% higher risk for HEART   ATTACK  &  STROKE.    - It is also associated with higher death rate at younger ages,   autoimmune diseases like Rheumatoid arthritis, Lupus, Multiple Sclerosis.     - Also many  other serious conditions, like depression, Alzheimer's  Dementia,  muscle aches, fatigue, fibromyalgia   ^<^<^<^<^<^<^<^<^<^<^<^<^<^<^<^<^<^<^<^<^<^<^<^<^<^<^<^<^<^<^<^<^<^<^<^<^ ^>^>^>^>^>^>^>^>^>^>^>^>^>^>^>^>^>^>^>^>^>^>^>^>^>^>^>^>^>^>^>^>^>^>^>^>^  - All Else - CBC - Kidneys - Electrolytes - Liver - Magnesium & Thyroid     - all  Normal / OK  ^<^<^<^<^<^<^<^<^<^<^<^<^<^<^<^<^<^<^<^<^<^<^<^<^<^<^<^<^<^<^<^<^<^<^<^<^ ^>^>^>^>^>^>^>^>^>^>^>^>^>^>^>^>^>^>^>^>^>^>^>^>^>^>^>^>^>^>^>^>^>^>^>^>^

## 2022-11-25 ENCOUNTER — Encounter: Payer: Self-pay | Admitting: Internal Medicine

## 2023-01-17 ENCOUNTER — Ambulatory Visit
Admission: RE | Admit: 2023-01-17 | Discharge: 2023-01-17 | Disposition: A | Discharge status: Home / Self Care | Payer: 59 | Source: Ambulatory Visit | Attending: Internal Medicine | Admitting: Internal Medicine

## 2023-01-17 DIAGNOSIS — Z122 Encounter for screening for malignant neoplasm of respiratory organs: Secondary | ICD-10-CM

## 2023-02-12 NOTE — Progress Notes (Signed)
<>*<>*<>*<>*<>*<>*<>*<>*<>*<>*<>*<>*<>*<>*<>*<>*<>*<>*<>*<>*<>*<>*<>*<>*<> <>*<>*<>*<>*<>*<>*<>*<>*<>*<>*<>*<>*<>*<>*<>*<>*<>*<>*<>*<>*<>*<>*<>*<>*<>  -  Chest CT scan - Great  "No Sign of Lung Cancer"  - But Radiologist does recommend continue                                                annual screening CT Lung scans  <>*<>*<>*<>*<>*<>*<>*<>*<>*<>*<>*<>*<>*<>*<>*<>*<>*<>*<>*<>*<>*<>*<>*<>*<> <>*<>*<>*<>*<>*<>*<>*<>*<>*<>*<>*<>*<>*<>*<>*<>*<>*<>*<>*<>*<>*<>*<>*<>*<>  -

## 2023-02-25 NOTE — Progress Notes (Unsigned)
FOLLOW UP  Assessment and Plan:   Aortic Atherosclerosis by CT scan 12/2019 Control blood pressure, cholesterol, glucose, increase exercise  Pulmonary Emphysema Strongly encourage to taper and quit smoking- not ready Monitor symptoms- currently asymptomatic  CAD Control blood pressure, cholesterol, glucose, increase exercise low risk stress myoview in 10/2018, denies angina Stop smoking Followed by cardiology  Hypertension Not currently controlled Reviewed medication and added HCTZ 25mg  QD Advised to avoid smoking right prior to OV Monitor blood pressure at home; patient to call if consistently greater than 130/80 Continue DASH diet.   Reminder to go to the ER if any CP, SOB, nausea, dizziness, severe HA, changes vision/speech, left arm numbness and tingling and jaw pain. Follow up in 1 month for reevaluation- bring BP log to visit Check CBC  Cholesterol At goal; continue medication Continue low cholesterol diet and exercise.  Check lipid panel.  Check CMP  Tobacco use Discussed risks associated with tobacco use and advised to reduce or quit Patient is not ready to do so Prescription for chantix with information provided Will follow up at the next visit Monitor by Low dose CT annually  Abnormal Glucose Continue diet and exercise Check A1c  GERD Symptoms well managed without breakthrough Failed trial to taper off of PPI Cut back on ETOH  Vitamin D Def He has increased supplement and taking 40102 IU daily; near goal at last check Defer vit D  Excessive drinking of alcohol Strongly encouraged to taper and quit- counseled on dangers Pt is not ready to stop   Hypogonadism He declines to initiate testosterone injections  He would prefer to work on lifestyle; fatigue has improved some Suggested initiate zinc 50 mg daily,  Check testosterone  Medication Management Continued   Continue diet and meds as discussed. Further disposition pending results of labs.  Discussed med's effects and SE's.   Over 30 minutes of exam, counseling, chart review, and critical decision making was performed.   Future Appointments  Date Time Provider Department Center  02/26/2023  9:45 AM Raynelle Dick, NP [facility] None  05/28/2023  9:30 AM Lucky Cowboy, MD [facility] None  08/29/2023  9:30 AM Raynelle Dick, NP [facility] None  12/02/2023 10:00 AM Lucky Cowboy, MD [facility] None    ----------------------------------------------------------------------------------------------------------------------  HPI 62 y.o. male  presents for 3 month follow up on hypertension, continued tobacco use and cholesterol;   he currently continues to smoke 1.5 pack a day, for 40+ years ; discussed risks associated with smoking, patient is ready to quit. Low dose CT scan discussed performed 01/10/21- emphysema  He admits to drinking 8 drinks "shots of hard liquor", drinks once he gets home from work, no urge to drink while working, hands don't shake. He does not feel ready to quit yet.  he has a diagnosis of GERD which is currently managed by protonix 40 mg daily, failed attempt to taper with H2i (high dose ranitidine)  he reports symptoms is currently well controlled, and denies breakthrough reflux, burning in chest, hoarseness or cough.    BMI is There is no height or weight on file to calculate BMI., he has not been working on diet and exercise, works in physically active job 3-4 hours a day.  Wt Readings from Last 3 Encounters:  11/20/22 188 lb 9.6 oz (85.5 kg)  09/04/22 189 lb 9.6 oz (86 kg)  08/08/22 190 lb 3.2 oz (86.3 kg)   He is s/p mid LAD 90% lesion stented in mid May 2017, followed by Dr.  Skains.  Underwent stress myoview in 10/2018 which showed low risk study; patient denies episodes of angina, has not had to take nitroglycerin.   His blood pressure has not been controlled at home running 150's/80's, today their BP is   Tried Imdur but could not tolerate  caused dizziness. He will occasionally forget to take Losartan at night. BP Readings from Last 3 Encounters:  11/20/22 (!) 140/84  09/04/22 118/72  08/08/22 118/88     He does workout. He denies chest pain, shortness of breath, dizziness.   He is on cholesterol medication (atorvastatin 20 mg daily) and denies myalgias. His cholesterol is at goal. The cholesterol last visit was:   Lab Results  Component Value Date   CHOL 196 11/20/2022   HDL 94 11/20/2022   LDLCALC 75 11/20/2022   TRIG 171 (H) 11/20/2022   CHOLHDL 2.1 11/20/2022   Last Z6X in the office was:  Lab Results  Component Value Date   HGBA1C 5.8 (H) 11/20/2022   Patient is on Vitamin D supplement, now taking 09604 IU daily  Lab Results  Component Value Date   VD25OH 95 11/20/2022     He has a history of testosterone deficiency, does endorse some low energy. Exercising less than he used to. States he no longer wants to do testosterone supplement. He has not tried zinc supplementation, agreeable to try.  Lab Results  Component Value Date   TESTOSTERONE 252 11/20/2022    Current Medications:  Current Outpatient Medications on File Prior to Visit  Medication Sig   amLODipine (NORVASC) 10 MG tablet TAKE 1 TABLET DAILY FOR BLOOD PRESSURE   aspirin EC 81 MG tablet Take 1 tablet (81 mg total) by mouth daily.   atenolol (TENORMIN) 100 MG tablet Take  1 tablet  Daily for BP                                                            /                                    TAKE                                                  BY                                                         MOUTH   atorvastatin (LIPITOR) 40 MG tablet TAKE 1 TABLET DAILY FOR CHOLESTEROL   Cholecalciferol 5000 units CHEW Chew 10,000 Units by mouth daily.   nitroGLYCERIN (NITROSTAT) 0.4 MG SL tablet Place 1 tablet (0.4 mg total) under the tongue every 5 (five) minutes as needed for chest pain.   olmesartan (BENICAR) 40 MG tablet Take  1 tablet  every  Night   for BP   pantoprazole (PROTONIX) 40 MG tablet TAKE 1 TABLET DAILY FOR ACID INDIGESTION & REFLUX  zinc gluconate 50 MG tablet Take 1 tablet (50 mg total) by mouth daily.   No current facility-administered medications on file prior to visit.     Allergies: No Known Allergies   Medical History:  Past Medical History:  Diagnosis Date   Coronary artery disease    a. Cath ~ 25 yrs ago, dx w/ pleurisy;  b. 07/2015 Cath/PCI: LM nl, LAD 45m (2.75x24 Synergy DES), LCX nl, RCA nl.   GERD (gastroesophageal reflux disease)    Hypertensive heart disease    Tobacco abuse    Family history- Reviewed and unchanged Social history- Reviewed and unchanged   Review of Systems:  Review of Systems  Constitutional:  Negative for malaise/fatigue and weight loss.  HENT:  Negative for hearing loss and tinnitus.   Eyes:  Negative for blurred vision and double vision.  Respiratory:  Negative for cough, shortness of breath and wheezing.   Cardiovascular:  Negative for chest pain, palpitations, orthopnea, claudication and leg swelling.  Gastrointestinal:  Negative for abdominal pain, blood in stool, constipation, diarrhea, heartburn, melena, nausea and vomiting.  Genitourinary: Negative.   Musculoskeletal:  Negative for joint pain and myalgias.  Skin:  Negative for rash.  Neurological:  Negative for dizziness, tingling, sensory change, weakness and headaches.  Endo/Heme/Allergies:  Negative for polydipsia.  Psychiatric/Behavioral: Negative.    All other systems reviewed and are negative.   Physical Exam: There were no vitals taken for this visit. Wt Readings from Last 3 Encounters:  11/20/22 188 lb 9.6 oz (85.5 kg)  09/04/22 189 lb 9.6 oz (86 kg)  08/08/22 190 lb 3.2 oz (86.3 kg)   General Appearance: Well nourished, in no apparent distress. Eyes: PERRLA, EOMs, conjunctiva no swelling or erythema Sinuses: No Frontal/maxillary tenderness ENT/Mouth: Ext aud canals clear, TMs without erythema,  bulging. No erythema, swelling, or exudate on post pharynx.  Tonsils not swollen or erythematous. Hearing normal.  Neck: Supple, thyroid normal.  Respiratory: Respiratory effort normal, BS equal bilaterally without rales, rhonchi, wheezing or stridor.  Cardio: RRR with no MRGs. Brisk peripheral pulses without edema.  Abdomen: Soft, + BS.  Non tender, no guarding, rebound, hernias, masses. Lymphatics: Non tender without lymphadenopathy.  Musculoskeletal: Full ROM, 5/5 strength, Normal gait Skin: Warm, dry without rashes, lesions, ecchymosis.  Neuro: Cranial nerves intact. No cerebellar symptoms.  Psych: Awake and oriented X 3, normal affect, Insight and Judgment appropriate.    Raynelle Dick, NP 12:44 PM [facility]

## 2023-02-26 ENCOUNTER — Ambulatory Visit (INDEPENDENT_AMBULATORY_CARE_PROVIDER_SITE_OTHER): Payer: 59 | Admitting: Nurse Practitioner

## 2023-02-26 ENCOUNTER — Ambulatory Visit: Payer: 59 | Admitting: Nurse Practitioner

## 2023-02-26 ENCOUNTER — Encounter: Payer: Self-pay | Admitting: Nurse Practitioner

## 2023-02-26 VITALS — BP 118/82 | HR 51 | Temp 97.5°F | Ht 75.0 in | Wt 192.6 lb

## 2023-02-26 DIAGNOSIS — R7309 Other abnormal glucose: Secondary | ICD-10-CM | POA: Diagnosis not present

## 2023-02-26 DIAGNOSIS — F17200 Nicotine dependence, unspecified, uncomplicated: Secondary | ICD-10-CM

## 2023-02-26 DIAGNOSIS — F1010 Alcohol abuse, uncomplicated: Secondary | ICD-10-CM

## 2023-02-26 DIAGNOSIS — I10 Essential (primary) hypertension: Secondary | ICD-10-CM

## 2023-02-26 DIAGNOSIS — E782 Mixed hyperlipidemia: Secondary | ICD-10-CM

## 2023-02-26 DIAGNOSIS — E291 Testicular hypofunction: Secondary | ICD-10-CM

## 2023-02-26 DIAGNOSIS — K219 Gastro-esophageal reflux disease without esophagitis: Secondary | ICD-10-CM

## 2023-02-26 DIAGNOSIS — J439 Emphysema, unspecified: Secondary | ICD-10-CM

## 2023-02-26 DIAGNOSIS — I700 Atherosclerosis of aorta: Secondary | ICD-10-CM

## 2023-02-26 DIAGNOSIS — E559 Vitamin D deficiency, unspecified: Secondary | ICD-10-CM

## 2023-02-26 DIAGNOSIS — Z720 Tobacco use: Secondary | ICD-10-CM

## 2023-02-26 DIAGNOSIS — Z79899 Other long term (current) drug therapy: Secondary | ICD-10-CM

## 2023-02-26 DIAGNOSIS — I2510 Atherosclerotic heart disease of native coronary artery without angina pectoris: Secondary | ICD-10-CM

## 2023-02-26 NOTE — Progress Notes (Signed)
FOLLOW UP  Assessment and Plan:   Aortic Atherosclerosis by CT scan 12/2019 Control blood pressure, cholesterol, glucose, increase exercise  Pulmonary Emphysema Strongly encourage to taper and quit smoking- not ready Monitor symptoms- currently asymptomatic UTD on CT  CAD Control blood pressure, cholesterol, glucose, increase exercise low risk stress myoview in 10/2018, denies angina Stop smoking Followed by cardiology  Hypertension Not currently controlled Reviewed medication and added HCTZ 25mg  QD Advised to avoid smoking right prior to OV Monitor blood pressure at home; patient to call if consistently greater than 130/80 Continue DASH diet.   Reminder to go to the ER if any CP, SOB, nausea, dizziness, severe HA, changes vision/speech, left arm numbness and tingling and jaw pain. Follow up in 1 month for reevaluation- bring BP log to visit Check CBC  Cholesterol At goal; continue medication Continue low cholesterol diet and exercise.  Check lipid panel.  Check CMP  Tobacco use Discussed risks associated with tobacco use and advised to reduce or quit Patient is not ready to do so Prescription for chantix with information provided Will follow up at the next visit Monitor by Low dose CT annually  Abnormal Glucose Continue diet and exercise - CMP  GERD Symptoms well managed without breakthrough Failed trial to taper off of PPI Cut back on ETOH - Magnesium  Vitamin D Def He has increased supplement and taking 40981 IU daily; near goal at last check Defer vit D  Excessive drinking of alcohol Strongly encouraged to taper and quit- counseled on dangers Pt is not ready to stop   Hypogonadism He declines to initiate testosterone injections  He would prefer to work on lifestyle; fatigue has improved some Suggested initiate zinc 50 mg daily,    Hypercalcemia -     COMPLETE METABOLIC PANEL WITH GFR  Medication management -     CBC with  Differential/Platelet -     COMPLETE METABOLIC PANEL WITH GFR -     Magnesium -     Lipid panel      Continue diet and meds as discussed. Further disposition pending results of labs. Discussed med's effects and SE's.   Over 30 minutes of exam, counseling, chart review, and critical decision making was performed.   Future Appointments  Date Time Provider Department Center  05/28/2023  9:30 AM Lucky Cowboy, MD [facility] None  08/29/2023  9:30 AM Raynelle Dick, NP [facility] None  12/02/2023 10:00 AM Lucky Cowboy, MD [facility] None    ----------------------------------------------------------------------------------------------------------------------  HPI 62 y.o. male  presents for 3 month follow up on hypertension, continued tobacco use and cholesterol;   he currently continues to smoke 1.5 pack a day, for 40+ years ; discussed risks associated with smoking, patient is ready to quit. Low dose CT scan discussed performed 01/17/23- emphysema: 1. Lung-RADS 2, benign appearance or behavior. Continue annual screening with low-dose chest CT without contrast in 12 months. 2. Ascending thoracic aorta measures up to 3.9 cm diameter. Attention on follow-up lung cancer screening chest CT recommended. 3.  Emphysema (ICD10-J43.9).  He admits to drinking 8 drinks "shots of hard liquor" daily or every other day drinks once he gets home from work, no urge to drink while working, hands don't shake. He does not feel ready to quit yet.  he has a diagnosis of GERD which is currently managed by protonix 40 mg daily, failed attempt to taper with H2i (high dose ranitidine)  he reports symptoms is currently well controlled, and denies breakthrough reflux, burning in chest,  hoarseness or cough.    BMI is Body mass index is 24.07 kg/m., he has not been working on diet and exercise, works in physically active job 3-4 hours a day.  Wt Readings from Last 3 Encounters:  02/26/23 192 lb 9.6 oz (87.4 kg)   11/20/22 188 lb 9.6 oz (85.5 kg)  09/04/22 189 lb 9.6 oz (86 kg)   He is s/p mid LAD 90% lesion stented in mid May 2017, followed by Dr. Anne Fu.  Underwent stress myoview in 10/2018 which showed low risk study; patient denies episodes of angina, has not had to take nitroglycerin.   His blood pressure has been controlled, today their BP is BP: 118/82 Currently controlled with Amlodipine 10 mg every day and Atenolol 100 mg every day.  Stopped taking olmesartan in September- he was having shortness of breath and dizziness. BP Readings from Last 3 Encounters:  02/26/23 118/82  11/20/22 (!) 140/84  09/04/22 118/72   He does workout. He denies chest pain, shortness of breath, dizziness since stopping olmesartan   He is on cholesterol medication (atorvastatin 40 mg 1/2 tab daily) and denies myalgias. His cholesterol is at goal. The cholesterol last visit was:   Lab Results  Component Value Date   CHOL 196 11/20/2022   HDL 94 11/20/2022   LDLCALC 75 11/20/2022   TRIG 171 (H) 11/20/2022   CHOLHDL 2.1 11/20/2022   Last B2W in the office was:  Lab Results  Component Value Date   HGBA1C 5.8 (H) 11/20/2022   Patient is on Vitamin D supplement, now taking 41324 IU daily  Lab Results  Component Value Date   VD25OH 19 11/20/2022     He has a history of testosterone deficiency, does endorse some low energy. Exercising less than he used to. States he no longer wants to do testosterone supplement. He has not tried zinc supplementation, agreeable to try.  Lab Results  Component Value Date   TESTOSTERONE 252 11/20/2022    Current Medications:  Current Outpatient Medications on File Prior to Visit  Medication Sig   amLODipine (NORVASC) 10 MG tablet TAKE 1 TABLET DAILY FOR BLOOD PRESSURE   aspirin EC 81 MG tablet Take 1 tablet (81 mg total) by mouth daily.   atenolol (TENORMIN) 100 MG tablet Take  1 tablet  Daily for BP                                                            /                                     TAKE                                                  BY  MOUTH   atorvastatin (LIPITOR) 40 MG tablet TAKE 1 TABLET DAILY FOR CHOLESTEROL   Cholecalciferol 5000 units CHEW Chew 10,000 Units by mouth daily.   nitroGLYCERIN (NITROSTAT) 0.4 MG SL tablet Place 1 tablet (0.4 mg total) under the tongue every 5 (five) minutes as needed for chest pain.   pantoprazole (PROTONIX) 40 MG tablet TAKE 1 TABLET DAILY FOR ACID INDIGESTION & REFLUX   zinc gluconate 50 MG tablet Take 1 tablet (50 mg total) by mouth daily.   olmesartan (BENICAR) 40 MG tablet Take  1 tablet  every  Night  for BP (Patient not taking: Reported on 02/26/2023)   No current facility-administered medications on file prior to visit.     Allergies: No Known Allergies   Medical History:  Past Medical History:  Diagnosis Date   Coronary artery disease    a. Cath ~ 25 yrs ago, dx w/ pleurisy;  b. 07/2015 Cath/PCI: LM nl, LAD 81m (2.75x24 Synergy DES), LCX nl, RCA nl.   GERD (gastroesophageal reflux disease)    Hypertensive heart disease    Tobacco abuse    Family history- Reviewed and unchanged Social history- Reviewed and unchanged   Review of Systems:  Review of Systems  Constitutional:  Negative for malaise/fatigue and weight loss.  HENT:  Negative for hearing loss and tinnitus.   Eyes:  Negative for blurred vision and double vision.  Respiratory:  Negative for cough, shortness of breath and wheezing.   Cardiovascular:  Negative for chest pain, palpitations, orthopnea, claudication and leg swelling.  Gastrointestinal:  Negative for abdominal pain, blood in stool, constipation, diarrhea, heartburn, melena, nausea and vomiting.  Genitourinary: Negative.   Musculoskeletal:  Negative for joint pain and myalgias.  Skin:  Negative for rash.  Neurological:  Negative for dizziness, tingling, sensory change, weakness and headaches.  Endo/Heme/Allergies:   Negative for polydipsia.  Psychiatric/Behavioral: Negative.    All other systems reviewed and are negative.   Physical Exam: BP 118/82   Pulse (!) 51   Temp (!) 97.5 F (36.4 C)   Ht 6\' 3"  (1.905 m)   Wt 192 lb 9.6 oz (87.4 kg)   SpO2 97%   BMI 24.07 kg/m  Wt Readings from Last 3 Encounters:  02/26/23 192 lb 9.6 oz (87.4 kg)  11/20/22 188 lb 9.6 oz (85.5 kg)  09/04/22 189 lb 9.6 oz (86 kg)   General Appearance: Well nourished, in no apparent distress. Eyes: PERRLA, EOMs, conjunctiva no swelling or erythema Sinuses: No Frontal/maxillary tenderness ENT/Mouth: Ext aud canals clear, TMs without erythema, bulging. No erythema, swelling, or exudate on post pharynx.  Tonsils not swollen or erythematous. Hearing normal.  Neck: Supple, thyroid normal.  Respiratory: Respiratory effort normal, BS equal bilaterally without rales, rhonchi, wheezing or stridor.  Cardio: RRR with no MRGs. Brisk peripheral pulses without edema.  Abdomen: Soft, + BS.  Lymphatics: Non tender without lymphadenopathy.  Musculoskeletal: Full ROM, 5/5 strength, Normal gait Skin: Warm, dry without rashes, lesions, ecchymosis.  Neuro: Cranial nerves intact. No cerebellar symptoms.  Psych: Awake and oriented X 3, normal affect, Insight and Judgment appropriate.    Raynelle Dick, NP 11:40 AM [facility]

## 2023-02-26 NOTE — Patient Instructions (Signed)
Tobacco Use Disorder Tobacco use disorder (TUD) occurs when a person craves, seeks, and uses tobacco, regardless of the consequences. This disorder can cause problems with mental and physical health. It can affect your ability to have healthy relationships. It can also keep you from meeting your responsibilities at work, home, or school. Tobacco products contain a dangerous chemical called nicotine. Nicotine triggers hormones that make the body feel stimulated and works on areas of the brain that make a person feel good. These effects can make the person depend on nicotine, which makes it hard to quit tobacco. Tobacco may be: Smoked as a cigarette or cigar. Inhaled using vaping devices, such as e-cigarettes. Smoked in a pipe or hookah. Chewed as smokeless tobacco. Inhaled into the nostrils as snuff. What are the causes? This condition is caused by using nicotine. Nicotine causes changes in your brain that make you want more and more. This is called addiction. This can make it hard to stop using tobacco once you start. What are the signs or symptoms? Symptoms of TUD may include: Being unable to stop your tobacco use even after planned quit attempts. Spending an abnormal amount of time getting or using tobacco. Craving tobacco. Tobacco use that: Interferes with your work, school, or home life. Interferes with your personal and social relationships. Makes you give up activities that you once enjoyed or found important. Using tobacco even though you know that it is: Dangerous or bad for your health or someone else's health. Causing problems in your life. Needing more and more of the substance to get the same effect (developing tolerance). Using the substance to avoid unpleasant symptoms if you do not use the substance (withdrawal). How is this diagnosed? This condition may be diagnosed based on: Your current and past tobacco use. Your health care provider may ask questions about how your  tobacco use affects your life. A physical exam. You may be diagnosed with TUD if you have at least two symptoms within a 12-month period. How is this treated? This condition is treated by stopping tobacco use. Many people are unable to quit on their own and need help. Treatment may include: Nicotine replacement therapy (NRT). NRT provides nicotine without the other harmful chemicals in tobacco. NRT gradually lowers the dosage of nicotine in the body and reduces withdrawal symptoms. NRT is available as: Over-the-counter gums, lozenges, and skin patches. Prescription mouth inhalers and nasal sprays. Medicine that acts on the brain to reduce cravings and withdrawal symptoms. A type of talk therapy that examines your triggers for tobacco use, how to avoid them, and how to cope with cravings (behavioral therapy). Hypnosis. This may help with withdrawal symptoms. Joining a support group for people coping with TUD. The best treatment for TUD is usually a combination of medicine, talk therapy, and support groups. Recovery can be a long process. Many people start using tobacco again after stopping (relapse). If you relapse, it does not mean that treatment will not work. Follow these instructions at home:  Lifestyle Do not use any products that contain nicotine or tobacco. These products include cigarettes, chewing tobacco, and vaping devices, such as e-cigarettes. If you need help quitting, ask your health care provider. Avoid things that trigger tobacco use as much as you can. Triggers include people and situations that usually cause you to use tobacco. Avoid drinks that contain caffeine, including coffee. These may worsen some withdrawal symptoms. Find ways to manage stress. Wanting to smoke may cause stress, and stress can make you want   to smoke. Relaxation techniques such as deep breathing, meditation, and yoga may help. Attend support groups as needed. These groups are an important part of long-term  recovery for many people. General instructions Take over-the-counter and prescription medicines only as told by your health care provider. Check with your health care provider before taking any new prescription or over-the-counter medicines. Decide on a friend, family member, or smoking quit-line (such as 1-800-QUIT-NOW in the [place] that you can call or text when you feel the urge to smoke or when you need help coping with cravings. Keep all follow-up visits. This is important. Contact a health care provider if: You are not able to take your medicines as told by your health care provider. Your symptoms get worse, even with treatment. Summary Tobacco use disorder (TUD) occurs when a person craves, seeks, and uses tobacco regardless of the consequences. This condition may be diagnosed based on your current and past tobacco use and a physical exam. Many people are unable to quit on their own and need help. Recovery can be a long process. The most effective treatment for TUD is usually a combination of medicine, talk therapy, and support groups. This information is not intended to replace advice given to you by your health care provider. Make sure you discuss any questions you have with your health care provider. Document Revised: 03/07/2021 Document Reviewed: 03/07/2021 Elsevier Patient Education  2024 [employer]  

## 2023-02-27 LAB — CBC WITH DIFFERENTIAL/PLATELET
Absolute Lymphocytes: 2329 {cells}/uL (ref 850–3900)
Absolute Monocytes: 561 {cells}/uL (ref 200–950)
Basophils Absolute: 78 {cells}/uL (ref 0–200)
Basophils Relative: 1.1 %
Eosinophils Absolute: 128 {cells}/uL (ref 15–500)
Eosinophils Relative: 1.8 %
HCT: 49 % (ref 38.5–50.0)
Hemoglobin: 16.2 g/dL (ref 13.2–17.1)
MCH: 32.1 pg (ref 27.0–33.0)
MCHC: 33.1 g/dL (ref 32.0–36.0)
MCV: 97 fL (ref 80.0–100.0)
MPV: 11.3 fL (ref 7.5–12.5)
Monocytes Relative: 7.9 %
Neutro Abs: 4004 {cells}/uL (ref 1500–7800)
Neutrophils Relative %: 56.4 %
Platelets: 203 10*3/uL (ref 140–400)
RBC: 5.05 10*6/uL (ref 4.20–5.80)
RDW: 13.4 % (ref 11.0–15.0)
Total Lymphocyte: 32.8 %
WBC: 7.1 10*3/uL (ref 3.8–10.8)

## 2023-02-27 LAB — COMPLETE METABOLIC PANEL WITH GFR
AG Ratio: 1.8 (calc) (ref 1.0–2.5)
ALT: 39 U/L (ref 9–46)
AST: 35 U/L (ref 10–35)
Albumin: 4.9 g/dL (ref 3.6–5.1)
Alkaline phosphatase (APISO): 92 U/L (ref 35–144)
BUN: 15 mg/dL (ref 7–25)
CO2: 29 mmol/L (ref 20–32)
Calcium: 10 mg/dL (ref 8.6–10.3)
Chloride: 101 mmol/L (ref 98–110)
Creat: 0.95 mg/dL (ref 0.70–1.35)
Globulin: 2.8 g/dL (ref 1.9–3.7)
Glucose, Bld: 103 mg/dL — ABNORMAL HIGH (ref 65–99)
Potassium: 4.5 mmol/L (ref 3.5–5.3)
Sodium: 139 mmol/L (ref 135–146)
Total Bilirubin: 1 mg/dL (ref 0.2–1.2)
Total Protein: 7.7 g/dL (ref 6.1–8.1)
eGFR: 91 mL/min/{1.73_m2} (ref >=60)

## 2023-02-27 LAB — MAGNESIUM: Magnesium: 2 mg/dL (ref 1.5–2.5)

## 2023-02-27 LAB — LIPID PANEL
Cholesterol: 185 mg/dL (ref <200)
HDL: 112 mg/dL (ref >=40)
LDL Cholesterol (Calc): 49 mg/dL
Non-HDL Cholesterol (Calc): 73 mg/dL (ref <130)
Total CHOL/HDL Ratio: 1.7 (calc) (ref <5.0)
Triglycerides: 153 mg/dL — ABNORMAL HIGH (ref <150)

## 2023-04-29 ENCOUNTER — Encounter: Payer: Self-pay | Admitting: Nurse Practitioner

## 2023-04-29 ENCOUNTER — Ambulatory Visit: Payer: 59 | Admitting: Nurse Practitioner

## 2023-04-29 VITALS — BP 110/82 | HR 88 | Temp 98.0°F | Ht 75.0 in | Wt 184.8 lb

## 2023-04-29 DIAGNOSIS — R6889 Other general symptoms and signs: Secondary | ICD-10-CM | POA: Diagnosis not present

## 2023-04-29 DIAGNOSIS — R051 Acute cough: Secondary | ICD-10-CM

## 2023-04-29 DIAGNOSIS — Z1152 Encounter for screening for COVID-19: Secondary | ICD-10-CM

## 2023-04-29 DIAGNOSIS — J069 Acute upper respiratory infection, unspecified: Secondary | ICD-10-CM

## 2023-04-29 DIAGNOSIS — R062 Wheezing: Secondary | ICD-10-CM | POA: Diagnosis not present

## 2023-04-29 DIAGNOSIS — F17200 Nicotine dependence, unspecified, uncomplicated: Secondary | ICD-10-CM

## 2023-04-29 LAB — POC COVID19 BINAXNOW: SARS Coronavirus 2 Ag: NEGATIVE

## 2023-04-29 LAB — POCT INFLUENZA A/B
Influenza A, POC: NEGATIVE
Influenza B, POC: NEGATIVE

## 2023-04-29 MED ORDER — AZITHROMYCIN 250 MG PO TABS: ORAL_TABLET | ORAL | 1 refills | Status: DC

## 2023-04-29 MED ORDER — PROMETHAZINE-DM 6.25-15 MG/5ML PO SYRP: 5.0000 mL | ORAL_SOLUTION | Freq: Four times a day (QID) | ORAL | 0 refills | Status: DC | PRN

## 2023-04-29 MED ORDER — PREDNISONE 10 MG PO TABS: ORAL_TABLET | ORAL | 0 refills | Status: DC

## 2023-04-29 NOTE — Patient Instructions (Signed)

## 2023-04-29 NOTE — Progress Notes (Signed)
Assessment and Plan:  Corey Collier was seen today for an episodic visit.  Diagnoses and all order for this visit:  1. Flu-like symptoms (Primary) Negative  - POCT Influenza A/B  2. Encounter for screening for COVID-19 Negative  - POC COVID-19  3. Upper respiratory tract infection, unspecified type Start tmt with abx - take in full and as directed to reduce abx resistance. Stay well hydrated to keep mucus thin and productive  - azithromycin (ZITHROMAX) 250 MG tablet; Take 2 tablets on  Day 1,  followed by 1 tablet  daily for 4 more days    for Sinusitis  /Bronchitis  Dispense: 6 each; Refill: 1  4. Acute cough Start Promethazine cough syrup  Stay well hydrated to keep any mucus thin an d productive. Coughing can be cuased by several factors including   breathing in things that bother (irritate) your lungs.  Allergies.  Asthma.  Mucus that runs down the back of your throat (postnasal drip).  Smoking/smoke.  Acid backing up from the stomach into the tube that moves food from the mouth to the stomach (gastroesophageal reflux). A cough can linger for 3 weeks. Watch for any changes in your cough and contact office if noticed including blood, pus, pain, night sweats. Cover your mouth when you cough. If the air is dry, use a cool mist vaporizer or humidifier in your home. If your cough is worse at night, try using extra pillows to raise your head up higher while you sleep. Call 911 or report to ER if you start to have difficulty breathing.   - promethazine-dextromethorphan (PROMETHAZINE-DM) 6.25-15 MG/5ML syrup; Take 5 mLs by mouth 4 (four) times daily as needed for cough.  Dispense: 240 mL; Refill: 0  5. Wheezing Start steroid taper to decrease inflammation. Patient refuses further treatment with inhaler. Report to ER or call 911 for any increase in difficulty breathing.  - predniSONE (DELTASONE) 10 MG tablet; 1 tab 3 x day for 2 days, then 1 tab 2 x day for 2 days, then 1  tab 1 x day for 3 days  Dispense: 13 tablet; Refill: 0  6. Smoker Smoking cessation instruction/counseling given:  counseled patient on the dangers of tobacco use, advised patient to stop smoking, and reviewed strategies to maximize success   Notify office for further evaluation and treatment, questions or concerns if s/s fail to improve. The risks and benefits of my recommendations, as well as other treatment options were discussed with the patient today. Questions were answered.  Further disposition pending results of labs. Discussed med's effects and SE's.    Over 20 minutes of exam, counseling, chart review, and critical decision making was performed.   Future Appointments  Date Time Provider Department Center  05/28/2023 10:30 AM Lucky Cowboy, MD [facility] None  08/29/2023  9:30 AM Raynelle Dick, NP [facility] None  12/02/2023 10:00 AM Lucky Cowboy, MD [facility] None    ------------------------------------------------------------------------------------------------------------------   HPI BP 110/82   Pulse 88   Temp 98 F (36.7 C)   Ht 6\' 3"  (1.905 m)   Wt 184 lb 12.8 oz (83.8 kg)   SpO2 98%   BMI 23.10 kg/m   Patient complains of symptoms of a URI, possible sinusitis. Symptoms include congestion, cough described as waxing and waning over time, nasal congestion, sinus pressure, and sneezing. Onset of symptoms was 5 days ago, and has been unchanged since that time. Treatment to date: antihistamines and decongestants. Denies fever, chills.  He is a current smoker.  Past Medical History:  Diagnosis Date   Coronary artery disease    a. Cath ~ 25 yrs ago, dx w/ pleurisy;  b. 07/2015 Cath/PCI: LM nl, LAD 36m (2.75x24 Synergy DES), LCX nl, RCA nl.   GERD (gastroesophageal reflux disease)    Hypertensive heart disease    Tobacco abuse      No Known Allergies  Current Outpatient Medications on File Prior to Visit  Medication Sig   amLODipine (NORVASC) 10 MG tablet  TAKE 1 TABLET DAILY FOR BLOOD PRESSURE   aspirin EC 81 MG tablet Take 1 tablet (81 mg total) by mouth daily.   atenolol (TENORMIN) 100 MG tablet Take  1 tablet  Daily for BP                                                            /                                    TAKE                                                  BY                                                         MOUTH   atorvastatin (LIPITOR) 40 MG tablet TAKE 1 TABLET DAILY FOR CHOLESTEROL   Cholecalciferol 5000 units CHEW Chew 10,000 Units by mouth daily.   nitroGLYCERIN (NITROSTAT) 0.4 MG SL tablet Place 1 tablet (0.4 mg total) under the tongue every 5 (five) minutes as needed for chest pain.   pantoprazole (PROTONIX) 40 MG tablet TAKE 1 TABLET DAILY FOR ACID INDIGESTION & REFLUX   zinc gluconate 50 MG tablet Take 1 tablet (50 mg total) by mouth daily.   olmesartan (BENICAR) 40 MG tablet Take  1 tablet  every  Night  for BP (Patient not taking: Reported on 04/29/2023)   No current facility-administered medications on file prior to visit.    ROS: all negative except what is noted in the HPI.   Physical Exam:  BP 110/82   Pulse 88   Temp 98 F (36.7 C)   Ht 6\' 3"  (1.905 m)   Wt 184 lb 12.8 oz (83.8 kg)   SpO2 98%   BMI 23.10 kg/m   General Appearance: NAD.  Awake, conversant and cooperative. Eyes: PERRLA, EOMs intact.  Sclera white.  Conjunctiva without erythema. Sinuses: No frontal/maxillary tenderness.  No nasal discharge. Nares patent.  ENT/Mouth: Ext aud canals clear.  Bilateral TMs w/DOL and without erythema or bulging. Hearing intact.  Posterior pharynx without swelling or exudate.  Tonsils without swelling or erythema.  Neck: Supple.  No masses, nodules or thyromegaly. Respiratory: Effort is regular with non-labored breathing. Breath sounds are equal bilaterally with scattered wheezing upon posterior right exhalation. No stridor.  Cardio: RRR with no MRGs. Brisk peripheral pulses without edema.  Abdomen:  Active BS in all four quadrants.  Soft and non-tender without guarding, rebound tenderness, hernias or masses. Lymphatics: Non tender without lymphadenopathy.  Musculoskeletal: Full ROM, 5/5 strength, normal ambulation.  No clubbing or cyanosis. Skin: Appropriate color for ethnicity. Warm without rashes, lesions, ecchymosis, ulcers.  Neuro: CN II-XII grossly normal. Normal muscle tone without cerebellar symptoms and intact sensation.   Psych: AO X 3,  appropriate mood and affect, insight and judgment.     Adela Glimpse, NP 10:34 AM [facility]

## 2023-05-07 ENCOUNTER — Encounter: Payer: Self-pay | Admitting: *Deleted

## 2023-05-16 ENCOUNTER — Other Ambulatory Visit: Payer: Self-pay

## 2023-05-16 DIAGNOSIS — E782 Mixed hyperlipidemia: Secondary | ICD-10-CM

## 2023-05-16 MED ORDER — ATORVASTATIN CALCIUM 40 MG PO TABS: ORAL_TABLET | ORAL | 0 refills | Status: DC

## 2023-05-28 ENCOUNTER — Ambulatory Visit: Payer: 59 | Admitting: Internal Medicine

## 2023-06-17 ENCOUNTER — Ambulatory Visit: Payer: 59 | Admitting: Family Medicine

## 2023-06-17 ENCOUNTER — Encounter: Payer: Self-pay | Admitting: Family Medicine

## 2023-06-17 VITALS — BP 122/78 | HR 96 | Temp 98.6°F | Ht 75.0 in | Wt 192.4 lb

## 2023-06-17 DIAGNOSIS — K219 Gastro-esophageal reflux disease without esophagitis: Secondary | ICD-10-CM

## 2023-06-17 DIAGNOSIS — I2510 Atherosclerotic heart disease of native coronary artery without angina pectoris: Secondary | ICD-10-CM | POA: Diagnosis not present

## 2023-06-17 DIAGNOSIS — N401 Enlarged prostate with lower urinary tract symptoms: Secondary | ICD-10-CM | POA: Diagnosis not present

## 2023-06-17 DIAGNOSIS — I119 Hypertensive heart disease without heart failure: Secondary | ICD-10-CM

## 2023-06-17 DIAGNOSIS — Z720 Tobacco use: Secondary | ICD-10-CM

## 2023-06-17 DIAGNOSIS — E782 Mixed hyperlipidemia: Secondary | ICD-10-CM

## 2023-06-17 DIAGNOSIS — N138 Other obstructive and reflux uropathy: Secondary | ICD-10-CM

## 2023-06-17 DIAGNOSIS — I10 Essential (primary) hypertension: Secondary | ICD-10-CM | POA: Diagnosis not present

## 2023-06-17 MED ORDER — PANTOPRAZOLE SODIUM 40 MG PO TBEC: 40.0000 mg | DELAYED_RELEASE_TABLET | Freq: Every day | ORAL | 3 refills | Status: AC

## 2023-06-17 MED ORDER — TAMSULOSIN HCL 0.4 MG PO CAPS
0.4000 mg | ORAL_CAPSULE | Freq: Every day | ORAL | 3 refills | Status: DC
Start: 2023-06-17 — End: 2023-09-20

## 2023-06-17 MED ORDER — NITROGLYCERIN 0.4 MG SL SUBL: 0.4000 mg | SUBLINGUAL_TABLET | SUBLINGUAL | 6 refills | Status: DC | PRN

## 2023-06-17 MED ORDER — ATENOLOL 100 MG PO TABS: 100.0000 mg | ORAL_TABLET | Freq: Every day | ORAL | 3 refills | Status: AC

## 2023-06-17 MED ORDER — ATORVASTATIN CALCIUM 40 MG PO TABS: 40.0000 mg | ORAL_TABLET | Freq: Every day | ORAL | 3 refills | Status: AC

## 2023-06-17 NOTE — Assessment & Plan Note (Signed)
 Corey Collier has at least moderate LUTS. I recommend a trial of tamsulosin to see if we can reduce his urgency and nocturia.

## 2023-06-17 NOTE — Assessment & Plan Note (Signed)
At goal. Continue atorvastatin 40 mg daily.

## 2023-06-17 NOTE — Assessment & Plan Note (Signed)
 I strongly advise smoking cessation, esp. in light of CAD, hypertension, and emphysema. We discussed approaches to this. I recommend he check with his insurance about Chantix. In the meantime, he could try using his nicotine patches. I reviewed with him about how to decrease the dose monthly until stopping the medicine.  I spent 5 minutes counseling the patient about tobacco cessation.

## 2023-06-17 NOTE — Assessment & Plan Note (Signed)
 Stable. Denies angina. Continue atenolol 100 mg daily and aspirin 81 mg daily. I will renew his NTG. I strongly advise smoking cessation.

## 2023-06-17 NOTE — Assessment & Plan Note (Signed)
 Stable. Continue atenolol 100mg  daily

## 2023-06-17 NOTE — Progress Notes (Signed)
 [facility] [address] [place] [address] Dept: [phone] Dept Fax: [phone]  New Patient Office Visit  Subjective:    Patient ID: Corey [place], male    DOB: 12-Aug-1960, 63 y.o..   MRN: [id number]  Chief Complaint  Patient presents with   Establish Care    NP- establish care.   No concerns.     History of Present Illness:  Patient is in today to establish care. [name] was born in [place], [place]. His family moved to [place] when he was 99-53 years old. He attended high school at [employer]. 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 [name] notes his wife died about 2 years ago. He had been separated from her for a long time, but they had maintained a friendship. He has two daughters (47s, 57) and two grandchildren. [name] drinks 2-3 shots of liquor a night. He denies drug use.  [name] admits to smoking 1 ppd of cigarettes for many years. He has thought about quitting. He notes his insurance does not cover Chantix. He has some nicotine patches, but has not tried to use these.  [name] has a history of coronary artery disease. He had a coronary stent placed in 2017. He denies any chest pain. He is managed on a daily aspirin and he takes atenolol 100 mg daily.  [name] has a history of hypertension. He is managed on amlodipine 10 mg daily and atenolol 100 mg daily.  [name] has a history of hyperlipidemia. He is managed on atorvastatin 40 mg daily.  [name] has a history of BPH. He admits to some poor stream, occasional dribbling, sudden urinary urgency, and getting up twice a night to urinate. PSA testing int he past has been normal.  Past Medical History: Patient Active Problem List   Diagnosis Date Noted   Angina pectoris (HCC) 02/06/2022   Aortic atherosclerosis (HCC) by CT scan  12/2019 01/02/2020   Emphysema lung (HCC) 01/02/2020   Excessive drinking of alcohol 12/22/2019   BPH with obstruction/lower urinary tract symptoms 08/26/2018   History of smoking 30 or more pack years (42) 04/28/2018   Essential hypertension 01/24/2016   Hyperlipidemia, mixed 01/24/2016   Vitamin D deficiency 01/24/2016   Hypertensive heart disease    Tobacco abuse    GERD (gastroesophageal reflux disease)    Coronary artery disease    Past Surgical History:  Procedure Laterality Date   CARDIAC CATHETERIZATION N/A 08/11/2015   Procedure: Left Heart Cath and Coronary Angiography;  Surgeon: Jake Bathe, MD;  [facility];  Service: Cardiovascular;  Laterality: N/A;   CARDIAC CATHETERIZATION N/A 08/11/2015   Procedure: Coronary Stent Intervention;  Surgeon: Jake Bathe, MD;  Location: [facility];  Service: Cardiovascular;  Laterality: N/A;   CARDIAC CATHETERIZATION N/A 08/11/2015   Procedure: Coronary Stent Intervention;  Surgeon: Corky Crafts, MD;  Location: [facility] INVASIVE CV LAB;  Service: Cardiovascular;  Laterality: N/A;   LUMBAR DISC SURGERY     Family History  Problem Relation Age of Onset   Heart disease Mother    Stroke Father    Peripheral Artery Disease Sister    Peripheral Artery Disease Brother    Outpatient Medications Prior to Visit  Medication Sig Dispense Refill   amLODipine (NORVASC) 10 MG tablet TAKE 1 TABLET DAILY FOR BLOOD PRESSURE 90 tablet  3   aspirin EC 81 MG tablet Take 1 tablet (81 mg total) by mouth daily. 90 tablet 3   Cholecalciferol 5000 units CHEW Chew 10,000 Units by mouth daily.     zinc gluconate 50 MG tablet Take 1 tablet (50 mg total) by mouth daily.     atenolol (TENORMIN) 100 MG tablet Take  1 tablet  Daily for BP                                                            /                                    TAKE                                                  BY                                                          MOUTH 90 tablet 3   atorvastatin (LIPITOR) 40 MG tablet TAKE 1 TABLET DAILY FOR CHOLESTEROL 90 tablet 0   nitroGLYCERIN (NITROSTAT) 0.4 MG SL tablet Place 1 tablet (0.4 mg total) under the tongue every 5 (five) minutes as needed for chest pain. 25 tablet 6   pantoprazole (PROTONIX) 40 MG tablet TAKE 1 TABLET DAILY FOR ACID INDIGESTION & REFLUX 90 tablet 3   azithromycin (ZITHROMAX) 250 MG tablet Take 2 tablets on  Day 1,  followed by 1 tablet  daily for 4 more days    for Sinusitis  /Bronchitis 6 each 1   olmesartan (BENICAR) 40 MG tablet Take  1 tablet  every  Night  for BP (Patient not taking: Reported on 02/26/2023) 90 tablet 3   predniSONE (DELTASONE) 10 MG tablet 1 tab 3 x day for 2 days, then 1 tab 2 x day for 2 days, then 1 tab 1 x day for 3 days 13 tablet 0   promethazine-dextromethorphan (PROMETHAZINE-DM) 6.25-15 MG/5ML syrup Take 5 mLs by mouth 4 (four) times daily as needed for cough. 240 mL 0   No facility-administered medications prior to visit.   No Known Allergies Objective:   Today's Vitals   06/17/23 1017  BP: 122/78  Pulse: 96  Temp: 98.6 F (37 C)  TempSrc: Temporal  SpO2: 98%  Weight: 192 lb 6.4 oz (87.3 kg)  Height: 6\' 3"  (1.905 m)   Body mass index is 24.05 kg/m.   General: Well developed, well nourished. No acute distress. Psych: Alert and oriented. Normal mood and affect.  Health Maintenance Due  Topic Date Due   Pneumococcal Vaccine 86-41 Years old (1 of 2 - PCV) Never done   HIV Screening  Never done   Hepatitis C Screening  Never done   Zoster Vaccines- Shingrix (1 of 2) Never done   Lab Results    Latest Ref Rng & Units 02/26/2023  12:04 PM 11/20/2022   11:11 AM 08/08/2022    4:36 PM  CMP  Glucose 65 - 99 mg/dL 045  95  97   BUN 7 - 25 mg/dL 15  14  12    Creatinine 0.70 - 1.35 mg/dL 4.09  8.11  9.14   Sodium 135 - 146 mmol/L 139  140  141   Potassium 3.5 - 5.3 mmol/L 4.5  4.7  4.2   Chloride 98 - 110 mmol/L 101  102  103   CO2 20 - 32  mmol/L 29  30  30    Calcium 8.6 - 10.3 mg/dL 78.2  95.6  21.3   Total Protein 6.1 - 8.1 g/dL 7.7  7.6  7.6   Total Bilirubin 0.2 - 1.2 mg/dL 1.0  0.9  0.9   AST 10 - 35 U/L 35  22  39   ALT 9 - 46 U/L 39  28  38    Last lipids Lab Results  Component Value Date   CHOL 185 02/26/2023   HDL 112 02/26/2023   LDLCALC 49 02/26/2023   TRIG 153 (H) 02/26/2023   CHOLHDL 1.7 02/26/2023   Last hemoglobin A1c Lab Results  Component Value Date   HGBA1C 5.8 (H) 11/20/2022      Assessment & Plan:   Problem List Items Addressed This Visit       Cardiovascular and Mediastinum   Coronary artery disease   Stable. Denies angina. Continue atenolol 100 mg daily and aspirin 81 mg daily. I will renew his NTG. I strongly advise smoking cessation.      Relevant Medications   atenolol (TENORMIN) 100 MG tablet   atorvastatin (LIPITOR) 40 MG tablet   nitroGLYCERIN (NITROSTAT) 0.4 MG SL tablet   Essential hypertension - Primary   Blood pressure is in good control. Continue amlodipine 10 mg daily and atenolol 100 mg daily.      Relevant Medications   atenolol (TENORMIN) 100 MG tablet   atorvastatin (LIPITOR) 40 MG tablet   nitroGLYCERIN (NITROSTAT) 0.4 MG SL tablet   Hypertensive heart disease   Stable. Continue atenolol 100 mg daily.      Relevant Medications   atenolol (TENORMIN) 100 MG tablet   atorvastatin (LIPITOR) 40 MG tablet   nitroGLYCERIN (NITROSTAT) 0.4 MG SL tablet     Genitourinary   BPH with obstruction/lower urinary tract symptoms   Mr. Yohn has at least moderate LUTS. I recommend a trial of tamsulosin to see if we can reduce his urgency and nocturia.      Relevant Medications   tamsulosin (FLOMAX) 0.4 MG CAPS capsule     Other   Hyperlipidemia, mixed   At goal. Continue atorvastatin 40 mg daily.      Relevant Medications   atenolol (TENORMIN) 100 MG tablet   atorvastatin (LIPITOR) 40 MG tablet   nitroGLYCERIN (NITROSTAT) 0.4 MG SL tablet   Tobacco abuse   I  strongly advise smoking cessation, esp. in light of CAD, hypertension, and emphysema. We discussed approaches to this. I recommend he check with his insurance about Chantix. In the meantime, he could try using his nicotine patches. I reviewed with him about how to decrease the dose monthly until stopping the medicine.  I spent 5 minutes counseling the patient about tobacco cessation.       Other Visit Diagnoses       Mixed hyperlipidemia       Relevant Medications   atenolol (TENORMIN) 100 MG tablet   atorvastatin (LIPITOR)  40 MG tablet   nitroGLYCERIN (NITROSTAT) 0.4 MG SL tablet     Gastroesophageal reflux disease       Relevant Medications   pantoprazole (PROTONIX) 40 MG tablet       Return in about 4 months (around 10/17/2023) for Reassessment.   Loyola Mast, MD

## 2023-06-17 NOTE — Assessment & Plan Note (Signed)
 Blood pressure is in good control. Continue amlodipine 10 mg daily and atenolol 100 mg daily.

## 2023-08-29 ENCOUNTER — Ambulatory Visit: Payer: 59 | Admitting: Nurse Practitioner

## 2023-09-20 ENCOUNTER — Other Ambulatory Visit: Payer: Self-pay | Admitting: Family Medicine

## 2023-09-20 DIAGNOSIS — N138 Other obstructive and reflux uropathy: Secondary | ICD-10-CM

## 2023-10-15 ENCOUNTER — Telehealth: Payer: Self-pay

## 2023-10-15 ENCOUNTER — Other Ambulatory Visit: Payer: Self-pay | Admitting: Family Medicine

## 2023-10-15 ENCOUNTER — Ambulatory Visit: Payer: Self-pay | Admitting: Family Medicine

## 2023-10-15 ENCOUNTER — Ambulatory Visit: Admitting: Family Medicine

## 2023-10-15 ENCOUNTER — Encounter: Payer: Self-pay | Admitting: Family Medicine

## 2023-10-15 ENCOUNTER — Other Ambulatory Visit (HOSPITAL_COMMUNITY): Payer: Self-pay

## 2023-10-15 VITALS — BP 118/80 | HR 63 | Temp 97.5°F | Ht 75.0 in | Wt 188.0 lb

## 2023-10-15 DIAGNOSIS — I2510 Atherosclerotic heart disease of native coronary artery without angina pectoris: Secondary | ICD-10-CM

## 2023-10-15 DIAGNOSIS — Z720 Tobacco use: Secondary | ICD-10-CM

## 2023-10-15 DIAGNOSIS — I499 Cardiac arrhythmia, unspecified: Secondary | ICD-10-CM

## 2023-10-15 DIAGNOSIS — I10 Essential (primary) hypertension: Secondary | ICD-10-CM

## 2023-10-15 DIAGNOSIS — I4891 Unspecified atrial fibrillation: Secondary | ICD-10-CM | POA: Insufficient documentation

## 2023-10-15 DIAGNOSIS — E782 Mixed hyperlipidemia: Secondary | ICD-10-CM

## 2023-10-15 LAB — COMPREHENSIVE METABOLIC PANEL WITH GFR
ALT: 35 U/L (ref 0–53)
AST: 30 U/L (ref 0–37)
Albumin: 4.4 g/dL (ref 3.5–5.2)
Alkaline Phosphatase: 79 U/L (ref 39–117)
BUN: 11 mg/dL (ref 6–23)
CO2: 32 meq/L (ref 19–32)
Calcium: 9.6 mg/dL (ref 8.4–10.5)
Chloride: 103 meq/L (ref 96–112)
Creatinine, Ser: 1.06 mg/dL (ref 0.40–1.50)
GFR: 75.31 mL/min (ref >60.00)
Glucose, Bld: 120 mg/dL — ABNORMAL HIGH (ref 70–99)
Potassium: 3.5 meq/L (ref 3.5–5.1)
Sodium: 140 meq/L (ref 135–145)
Total Bilirubin: 1.1 mg/dL (ref 0.2–1.2)
Total Protein: 7 g/dL (ref 6.0–8.3)

## 2023-10-15 LAB — CBC
HCT: 45.3 % (ref 39.0–52.0)
Hemoglobin: 15.1 g/dL (ref 13.0–17.0)
MCHC: 33.2 g/dL (ref 30.0–36.0)
MCV: 99.5 fl (ref 78.0–100.0)
Platelets: 191 K/uL (ref 150.0–400.0)
RBC: 4.55 Mil/uL (ref 4.22–5.81)
RDW: 14.4 % (ref 11.5–15.5)
WBC: 5.9 K/uL (ref 4.0–10.5)

## 2023-10-15 LAB — TSH: TSH: 0.5 u[IU]/mL (ref 0.35–5.50)

## 2023-10-15 LAB — T4, FREE: Free T4: 0.91 ng/dL (ref 0.60–1.60)

## 2023-10-15 MED ORDER — VARENICLINE TARTRATE 0.5 MG PO TABS: ORAL_TABLET | ORAL | 0 refills | Status: AC

## 2023-10-15 MED ORDER — NITROGLYCERIN 0.4 MG SL SUBL: 0.4000 mg | SUBLINGUAL_TABLET | SUBLINGUAL | 6 refills | Status: AC | PRN

## 2023-10-15 MED ORDER — VARENICLINE TARTRATE 1 MG PO TABS: 1.0000 mg | ORAL_TABLET | Freq: Two times a day (BID) | ORAL | 1 refills | Status: DC

## 2023-10-15 NOTE — Progress Notes (Signed)
 [facility] [address] [place] [address] Dept: [phone] Dept Fax: [phone]  Chronic Care Office Visit  Subjective:    Patient ID: Corey [place], male    DOB: October 12, 1960, 63 y.o..   MRN: [id number]  Chief Complaint  Patient presents with   Hypertension    82mo f/u; doing alright; been feeling about the same   History of Present Illness:  Patient is in today for reassessment of chronic medical issues.  [name] smokes 1 to 1 1/2 ppd of cigarettes for many years. He continues to contemplate quitting. He is under the impression that his insurance does not cover Chantix .   [name] has a history of coronary artery disease. He had a coronary stent placed in 2017. He denies any chest pain. He is managed on a daily aspirin  and atenolol  100 mg daily. He notes he occasionally does have brief episodes of palpitations.   [name] has a history of hypertension. He is managed on amlodipine  10 mg daily and atenolol  100 mg daily.   [name] has a history of hyperlipidemia. He is managed on atorvastatin  40 mg daily.   [name] has a history of BPH. He is managed on tamsulosin  0.4 mg daily.  Past Medical History: Patient Active Problem List   Diagnosis Date Noted   Atrial fibrillation (HCC) 10/15/2023   Angina pectoris (HCC) 02/06/2022   Aortic atherosclerosis (HCC) by CT scan 12/2019 01/02/2020   Emphysema lung (HCC) 01/02/2020   Excessive drinking of alcohol 12/22/2019   BPH with obstruction/lower urinary tract symptoms 08/26/2018   History of smoking 30 or more pack years (42) 04/28/2018   Essential hypertension 01/24/2016   Hyperlipidemia, mixed 01/24/2016   Vitamin D  deficiency 01/24/2016   Hypertensive heart disease    Tobacco abuse    GERD (gastroesophageal reflux disease)    Coronary artery disease    Past Surgical History:  Procedure Laterality Date   CARDIAC CATHETERIZATION N/A  08/11/2015   Procedure: Left Heart Cath and Coronary Angiography;  Surgeon: Corey JAYSON Parchment, MD;  Location: [facility];  Service: Cardiovascular;  Laterality: N/A;   CARDIAC CATHETERIZATION N/A 08/11/2015   Procedure: Coronary Stent Intervention;  Surgeon: Corey JAYSON Parchment, MD;  Location: [facility];  Service: Cardiovascular;  Laterality: N/A;   CARDIAC CATHETERIZATION N/A 08/11/2015   Procedure: Coronary Stent Intervention;  Surgeon: Corey Corey Reek, MD;  Location[facility] INVASIVE CV LAB;  Service: Cardiovascular;  Laterality: N/A;   LUMBAR DISC SURGERY     Family History  Problem Relation Age of Onset   Heart disease Mother    Stroke Father    Peripheral Artery Disease Sister    Peripheral Artery Disease Brother    Outpatient Medications Prior to Visit  Medication Sig Dispense Refill   amLODipine  (NORVASC ) 10 MG tablet TAKE 1 TABLET DAILY FOR BLOOD PRESSURE 90 tablet 3   aspirin  EC 81 MG tablet Take 1 tablet (81 mg total) by mouth daily. 90 tablet 3   atenolol  (TENORMIN ) 100 MG tablet Take 1 tablet (100 mg total) by mouth daily. 90 tablet 3   atorvastatin  (LIPITOR) 40 MG tablet Take 1 tablet (40 mg total) by mouth daily. 90 tablet 3   Cholecalciferol 5000 units CHEW Chew 10,000 Units by mouth daily.     nitroGLYCERIN  (NITROSTAT ) 0.4 MG SL tablet Place 1 tablet (0.4 mg total) under the tongue every 5 (five) minutes as needed for chest pain. 25 tablet 6  pantoprazole  (PROTONIX ) 40 MG tablet Take 1 tablet (40 mg total) by mouth daily. 90 tablet 3   tamsulosin  (FLOMAX ) 0.4 MG CAPS capsule TAKE 1 CAPSULE BY MOUTH EVERY DAY 90 capsule 1   zinc  gluconate 50 MG tablet Take 1 tablet (50 mg total) by mouth daily.     No facility-administered medications prior to visit.   No Known Allergies Objective:   Today's Vitals   10/15/23 1010  BP: 118/80  Pulse: 63  Temp: (!) 97.5 F (36.4 C)  SpO2: 98%  Weight: 188 lb (85.3 kg)  Height: 6' 3 (1.905 m)   Body mass index is 23.5  kg/m.   General: Well developed, well nourished. No acute distress. CV: Irregular rhythm without murmurs or rubs. Pulses 2+ bilaterally. Psych: Alert and oriented. Normal mood and affect.  Health Maintenance Due  Topic Date Due   COVID-19 Vaccine (1) Never done   HIV Screening  Never done   Hepatitis C Screening  Never done   Pneumococcal Vaccine 63-71 Years old (1 of 2 - PCV) Never done   Zoster Vaccines- Shingrix (1 of 2) Never done   EKG: Atrial fibrillation with a rate of 76. Occasional supraventricular aberrancy. RSR' in V1, but with normal QRS duration and axis.    Assessment & Plan:   Problem List Items Addressed This Visit       Cardiovascular and Mediastinum   Atrial fibrillation [facility]) - Primary   [name] is in atrial fibrillation today. I will check labs to assess for potential underlying causes. I will refer him to cardiology (previously seen by Corey Parchment, MD, who has retired) for assessment and an echocardiogram. Discussed that he may benefit form anticoagulation with a DOAC for stroke prevention. Rate currently well controlled.      Relevant Medications   nitroGLYCERIN  (NITROSTAT ) 0.4 MG SL tablet   Other Relevant Orders   EKG 12-Lead (Completed)   Comprehensive metabolic panel with GFR   CBC   TSH   T4, free   Ambulatory referral to Cardiology   Coronary artery disease   Stable. Denies angina. Continue atenolol  100 mg daily and aspirin  81 mg daily. I will renew his NTG. I strongly advise smoking cessation.      Relevant Medications   nitroGLYCERIN  (NITROSTAT ) 0.4 MG SL tablet   Essential hypertension   Blood pressure is in good control. Continue amlodipine  10 mg daily and atenolol  100 mg daily.      Relevant Medications   nitroGLYCERIN  (NITROSTAT ) 0.4 MG SL tablet     Other   Hyperlipidemia, mixed   LDL cholesterol is at goal. Continue atorvastatin  40 mg daily.      Relevant Medications   nitroGLYCERIN  (NITROSTAT ) 0.4 MG SL tablet   Tobacco  abuse   I strongly advise smoking cessation, esp. in light of CAD, hypertension, emphysema, and now atrial fibrillation. He is willing for me to prescribe Chantix  for him.  It does appear his insurance will cover this.  I spent 5 minutes counseling the patient about tobacco cessation.       Relevant Medications   varenicline  (CHANTIX ) 0.5 MG tablet   varenicline  (CHANTIX ) 1 MG tablet    Return in about 3 months (around 01/15/2024) for Reassessment.   Garnette CHRISTELLA Simpler, MD

## 2023-10-15 NOTE — Assessment & Plan Note (Signed)
 Blood pressure is in good control. Continue amlodipine 10 mg daily and atenolol 100 mg daily.

## 2023-10-15 NOTE — Assessment & Plan Note (Signed)
 LDL cholesterol is at goal. Continue atorvastatin  40 mg daily.

## 2023-10-15 NOTE — Patient Instructions (Signed)
 Atrial Fibrillation Atrial fibrillation (AFib) is a type of irregular or rapid heartbeat (arrhythmia). In AFib, the top part of the heart (atria) beats in an irregular pattern. This makes the heart unable to pump blood normally and effectively. The goal of treatment is to prevent blood clots from forming, control your heart rate, or restore your heartbeat to a normal rhythm. If this condition is not treated, it can cause serious problems, such as a weakened heart muscle (cardiomyopathy) or a stroke. What are the causes? This condition is often caused by medical conditions that damage the heart's electrical system. These include: High blood pressure (hypertension). This is the most common cause. Certain heart problems or conditions, such as heart failure, coronary artery disease, heart valve problems, or heart surgery. Diabetes. Overactive thyroid (hyperthyroidism). Chronic kidney disease. Certain lung conditions, such as emphysema, pneumonia, or COPD. Obstructive sleep apnea. In some cases, the cause of this condition is not known. What increases the risk? This condition is more likely to develop in: Older adults. Athletes who do endurance exercise. People who have a family history of AFib. Males. People who are Caucasian. People who are obese. People who smoke or misuse alcohol. What are the signs or symptoms? Symptoms of this condition include: Fast or irregular heartbeats (palpitations). Discomfort or pain in your chest. Shortness of breath. Sudden light-headedness or weakness. Tiring easily during exercise or activity. Syncope (fainting). Sweating. In some cases, there are no symptoms. How is this diagnosed? Your health care provider may detect AFib when taking your pulse. If detected, this condition may be diagnosed with: An electrocardiogram (ECG) to check electrical signals of the heart. An ambulatory cardiac monitor to record your heart's activity for a few days. A  transthoracic echocardiogram (TTE) to create pictures of your heart. A transesophageal echocardiogram (TEE) to create even clearer pictures of your heart. A stress test to check your blood supply while you exercise. Imaging tests, such as a CT scan or chest X-ray. Blood tests. How is this treated? Treatment depends on underlying conditions and how you feel when you get AFib. This condition may be treated with: Medicines to prevent blood clots or to treat heart rate or heart rhythm problems. Electrical cardioversion to reset the heart's rhythm. A pacemaker to correct abnormal heart rhythm. Ablation to remove the heart tissue that sends abnormal signals. Left atrial appendage closure to seal the area where blood clots can form. In some cases, underlying conditions will be treated. Follow these instructions at home: Medicines Take over-the counter and prescription medicines only as told by your provider. Do not take any new medicines without talking to your provider. If you are taking blood thinners: Talk with your provider before taking aspirin or NSAIDs. These medicines can raise your risk of bleeding. Take your medicines as told. Take them at the same time each day. Do not do things that could hurt or bruise you. Be careful to avoid falls. Wear an alert bracelet or carry a card that says that you take blood thinners. Lifestyle Do not use any products that contain nicotine or tobacco. These products include cigarettes, chewing tobacco, and vaping devices, such as e-cigarettes. If you need help quitting, ask your provider. Eat heart-healthy foods. Talk with a food expert (dietitian) to make an eating plan that is right for you. Exercise regularly as told by your provider. Do not drink alcohol. Lose weight if you are overweight. General instructions If you have obstructive sleep apnea, manage your condition as told by your provider.  Do not use diet pills unless your provider approves. Diet  pills can make heart problems worse. Keep all follow-up visits. Your provider will want to check your heart rate and rhythm regularly. Contact a health care provider if: You notice a change in the rate, rhythm, or strength of your heartbeat. You are taking a blood thinner and you notice more bruising. You tire more easily when you exercise or do heavy work. You have a sudden change in weight. Get help right away if:  You have chest pain. You have trouble breathing. You have side effects of blood thinners, such as blood in your vomit, poop (stool), or pee (urine), or bleeding that does not stop. You have any symptoms of a stroke. "BE FAST" is an easy way to remember the main warning signs of a stroke: B - Balance. Signs are dizziness, sudden trouble walking, or loss of balance. E - Eyes. Signs are trouble seeing or a sudden change in vision. F - Face. Signs are sudden weakness or numbness of the face, or the face or eyelid drooping on one side. A - Arms. Signs are weakness or numbness in an arm. This happens suddenly and usually on one side of the body. S - Speech.Signs are sudden trouble speaking, slurred speech, or trouble understanding what people say. T - Time. Time to call emergency services. Write down what time symptoms started. Other signs of a stroke, such as: A sudden, severe headache with no known cause. Nausea or vomiting. Seizure. These symptoms may be an emergency. Get help right away. Call 911. Do not wait to see if the symptoms will go away. Do not drive yourself to the hospital. This information is not intended to replace advice given to you by your health care provider. Make sure you discuss any questions you have with your health care provider. Document Revised: 11/29/2021 Document Reviewed: 11/29/2021 Elsevier Patient Education  2024 [employer].

## 2023-10-15 NOTE — Assessment & Plan Note (Signed)
 Mr. Dec is in atrial fibrillation today. I will check labs to assess for potential underlying causes. I will refer him to cardiology (previously seen by Oneil Parchment, MD, who has retired) for assessment and an echocardiogram. Discussed that he may benefit form anticoagulation with a DOAC for stroke prevention. Rate currently well controlled.

## 2023-10-15 NOTE — Assessment & Plan Note (Signed)
 Stable. Denies angina. Continue atenolol 100 mg daily and aspirin 81 mg daily. I will renew his NTG. I strongly advise smoking cessation.

## 2023-10-15 NOTE — Telephone Encounter (Signed)
 Pharmacy Patient Advocate Encounter   Received notification from CoverMyMeds that prior authorization for Varenicline  Tartrate 1MG  tablets  is required/requested.   Insurance verification completed.   The patient is insured through [facility] .   Per test claim: PA required; PA started via CoverMyMeds. KEY [id number] . Waiting for clinical questions to populate.

## 2023-10-15 NOTE — Assessment & Plan Note (Signed)
 I strongly advise smoking cessation, esp. in light of CAD, hypertension, emphysema, and now atrial fibrillation. He is willing for me to prescribe Chantix  for him.  It does appear his insurance will cover this.  I spent 5 minutes counseling the patient about tobacco cessation.

## 2023-10-16 ENCOUNTER — Other Ambulatory Visit (HOSPITAL_COMMUNITY): Payer: Self-pay

## 2023-10-16 NOTE — Telephone Encounter (Signed)
 PLEASE BE ADVISED Clinical questions have been answered and PA submitted.TO PLAN. PA currently Pending.

## 2023-10-17 NOTE — Telephone Encounter (Signed)
 Prior Authorization form/request asks a question that requires your assistance. Please see the question below and advise accordingly. The PA will not be submitted until the necessary information is received.

## 2023-10-17 NOTE — Telephone Encounter (Signed)
 Called and provided PCP info about labs. Pt acknowledged understanding.

## 2023-10-18 NOTE — Telephone Encounter (Signed)
 Additional information has been requested from the patient's insurance in order to proceed with the prior authorization request. Requested information has been sent, or form has been filled out and faxed back to [phone]    PHONE: [phone]

## 2023-10-21 NOTE — Telephone Encounter (Signed)
 Patient notified VIA phone.  He will try to use [facility].  Dm/cma

## 2023-10-21 NOTE — Telephone Encounter (Signed)
 Pharmacy Patient Advocate Encounter  Received notification from [facility] that Prior Authorization for Varenicline  Tartrate 1MG  tablets  has been DENIED.  Full denial letter will be uploaded to the media tab. See denial reason below.   PA #/Case ID/Reference #: : [id number]

## 2023-11-12 NOTE — Telephone Encounter (Signed)
 PA was denied because:   See 10/15/23 TE. Pt stated he will try the [facility] card for coverage.

## 2023-11-26 NOTE — Progress Notes (Signed)
  Electrophysiology Office Note:    Date:  11/27/2023   ID:  Corey Collier, DOB May 05, 1960, MRN [id number]  PCP:  Thedora Garnette HERO, MD   [facility] Providers Cardiologist:  None     Referring MD: Thedora Garnette HERO, MD   History of Present Illness:    Corey Collier is a 63 y.o. male with a medical history significant for atrial fibrillation, cigarette smoking, hypertension, hyperlipidemia, referred for arrhythmia management.      History of Present Illness Corey Collier is a 63 year old male with atrial fibrillation who presents for evaluation of his heart rhythm disorder. He was referred by Dr. Thedora for evaluation of atrial fibrillation.  Atrial fibrillation was first identified on an EKG on November 20, 2022. He previously experienced irregular heartbeats, particularly when lying down, but these have not been felt recently. He sometimes experiences fatigue and leg tiredness during walking.  He underwent a PCI in 2017 due to angina and fatigue, which revealed a blockage treated with a stent. He has coronary artery disease and hypertension. He is not currently on a blood thinner but has been taking baby aspirin , which he has recently run out of.  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 He lives in [place].         Today, he reports that he is at baseline and has no complaints  EKGs/Labs/Other Studies Reviewed Today:     Echocardiogram:  TTE  ordered    Cardiac catherization  May 2017 PCI to mid LAD  EKG:   EKG Interpretation Date/Time:  Wednesday November 27 2023 09:07:25 EDT Ventricular Rate:  69 PR Interval:    QRS Duration:  84 QT Interval:  404 QTC Calculation: 432 R Axis:   87  Text Interpretation: Atrial fibrillation When compared with ECG of 12-Aug-2015 05:50, Atrial fibrillation has replaced Sinus rhythm Vent. rate has increased BY  23 BPM Confirmed by Nancey Scotts [phone]) on 11/27/2023 9:15:46 AM      Physical Exam:    VS:  BP 120/86 (BP Location: Left Arm, Patient Position: Sitting, Cuff Size: Normal)   Pulse 69   Ht 6' 3 (1.905 m)   Wt 185 lb (83.9 kg)   SpO2 99%   BMI 23.12 kg/m     Wt Readings from Last 3 Encounters:  11/27/23 185 lb (83.9 kg)  10/15/23 188 lb (85.3 kg)  06/17/23 192 lb 6.4 oz (87.3 kg)     GEN: Well nourished, well developed in no acute distress CARDIAC: iRRR, no murmurs, rubs, gallops RESPIRATORY:  Normal work of breathing MUSCULOSKELETAL: no edema    ASSESSMENT & PLAN:     Atrial fibrillation -- likely persistent Symptoms, if any, are mild -- no palpitations, noticeable fatigue Stop aspirin  81 mg, start Eliquis  5 mg p.o. twice daily TTE Scheduled for DC cardioversion Will discuss rhythm control and follow-up after cardioversion  Hypertension BP slightly above goal today On amlodipine  10 mg daily and atenolol  100 mg daily  Coronary artery disease Status post PCI to mid LAD in 2017 Stable, no angina  Cigarette smoking Advised to quit smoking  Secondary hypercoagulable state CHA2DS2-VASc score is 2 for coronary disease and hypertension Start Eliquis  5 mg p.o. twice daily    Signed, Scotts FORBES Nancey, MD  11/27/2023 9:40 AM    [facility]

## 2023-11-27 ENCOUNTER — Ambulatory Visit: Attending: Cardiovascular Disease | Admitting: Cardiovascular Disease

## 2023-11-27 ENCOUNTER — Encounter: Payer: Self-pay | Admitting: Cardiovascular Disease

## 2023-11-27 VITALS — BP 120/86 | HR 69 | Ht 75.0 in | Wt 185.0 lb

## 2023-11-27 DIAGNOSIS — I4891 Unspecified atrial fibrillation: Secondary | ICD-10-CM

## 2023-11-27 MED ORDER — APIXABAN 5 MG PO TABS: 5.0000 mg | ORAL_TABLET | Freq: Two times a day (BID) | ORAL | 1 refills | Status: AC

## 2023-11-27 NOTE — Patient Instructions (Signed)
 Medication Instructions:  Your physician has recommended you make the following change in your medication:   ** Begin Eliquis  5mg  - 1 tablet by mouth twice daily.  *If you need a refill on your cardiac medications before your next appointment, please call your pharmacy*  Lab Work: None ordered.  If you have labs (blood work) drawn today and your tests are completely normal, you will receive your results only by: MyChart Message (if you have MyChart) OR A paper copy in the mail If you have any lab test that is abnormal or we need to change your treatment, we will call you to review the results.  Testing/Procedures: Your physician has requested that you have an echocardiogram. Echocardiography is a painless test that uses sound waves to create images of your heart. It provides your doctor with information about the size and shape of your heart and how well your heart's chambers and valves are working. This procedure takes approximately one hour. There are no restrictions for this procedure. Please do NOT wear cologne, perfume, aftershave, or lotions (deodorant is allowed). Please arrive 15 minutes prior to your appointment time.  Please note: We ask at that you not bring children with you during ultrasound (echo/ vascular) testing. Due to room size and safety concerns, children are not allowed in the ultrasound rooms during exams. Our front office staff cannot provide observation of children in our lobby area while testing is being conducted. An adult accompanying a patient to their appointment will only be allowed in the ultrasound room at the discretion of the ultrasound technician under special circumstances. We apologize for any inconvenience.  Your physician has recommended that you have a Cardioversion (DCCV). Electrical Cardioversion uses a jolt of electricity to your heart either through paddles or wired patches attached to your chest. This is a controlled, usually prescheduled,  procedure. Defibrillation is done under light anesthesia in the hospital, and you usually go home the day of the procedure. This is done to get your heart back into a normal rhythm. You are not awake for the procedure. Please see the instruction sheet given to you today.   Follow-Up: At [facility], you and your health needs are our priority.  As part of our continuing mission to provide you with exceptional heart care, our providers are all part of one team.  This team includes your primary Cardiologist (physician) and Advanced Practice Providers or APPs (Physician Assistants and Nurse Practitioners) who all work together to provide you with the care you need, when you need it.  Your next appointment:   Please schedule appointment after echo with Dr Nancey  We recommend signing up for the patient portal called MyChart.  Sign up information is provided on this After Visit Summary.  MyChart is used to connect with patients for Virtual Visits (Telemedicine).  Patients are able to view lab/test results, encounter notes, upcoming appointments, etc.  Non-urgent messages can be sent to your provider as well.   To learn more about what you can do with MyChart, go to [email or web address].   Other Instructions     Dear Corey Collier  You are scheduled for a Cardioversion on Monday, September 29 with Dr. Santo.  Please arrive at the [facility] (Main Entrance A) at [facility]: [address] [place], [place] [address] at 6:00 AM (This time is 1.5 hour(s) before your procedure to ensure your preparation).   Free valet parking service is available. You will check in at ADMITTING.   *  Please Note: You will receive a call the day before your procedure to confirm the appointment time. That time may have changed from the original time based on the schedule for that day.*   DIET:  Nothing to eat or drink after midnight except a sip of water with medications (see medication  instructions below)  MEDICATION INSTRUCTIONS: !!IF ANY NEW MEDICATIONS ARE STARTED AFTER TODAY, PLEASE NOTIFY YOUR PROVIDER AS SOON AS POSSIBLE!!  Continue taking your anticoagulant (blood thinner): Apixaban  (Eliquis ).  You will need to continue this after your procedure until you are told by your provider that it is safe to stop.    LABS: Your labs will be done at the hospital prior to your procedure - you will need to arrive 1 and 1/2 hours prior to your procedure.  FYI:  For your safety, and to allow us  to monitor your vital signs accurately during the surgery/procedure we request: If you have artificial nails, gel coating, SNS etc, please have those removed prior to your surgery/procedure. Not having the nail coverings /polish removed may result in cancellation or delay of your surgery/procedure.  Your support person will be asked to wait in the waiting room during your procedure.  It is OK to have someone drop you off and come back when you are ready to be discharged.  You cannot drive after the procedure and will need someone to drive you home.  Bring your insurance cards.  *Special Note: Every effort is made to have your procedure done on time. Occasionally there are emergencies that occur at the hospital that may cause delays. Please be patient if a delay does occur.

## 2023-11-27 NOTE — Addendum Note (Signed)
 Addended by: CASIMIR ALDONA BRAVO on: 11/27/2023 05:58 PM   Modules accepted: Orders

## 2023-12-02 ENCOUNTER — Encounter: Payer: 59 | Admitting: Internal Medicine

## 2023-12-20 ENCOUNTER — Telehealth: Payer: Self-pay

## 2023-12-20 NOTE — Telephone Encounter (Signed)
 Attempted phone call to pt and left voicemail message to contact office at [phone].  Cardioversion has been canceled with central scheduling.

## 2023-12-20 NOTE — Progress Notes (Signed)
 Spoke with patient to review instructions for appointment on Monday. Patient stated he had missed multiple doses of Eliquis . Advised he would need to be rescheduled and notified his provider.

## 2023-12-20 NOTE — Telephone Encounter (Signed)
 Received message from MaryAnn Han Lysne, RN - [facility] who reports she contacted pt regarding 12/23/2023 scheduled DCCV.  Pt reports he has missed multiple doses of Eliquis  over the past 3 weeks.  Per Stacy Marina, RN pt's DCCV will need to be rescheduled. She reports she made pt aware.

## 2023-12-23 ENCOUNTER — Ambulatory Visit (HOSPITAL_COMMUNITY): Admission: RE | Admit: 2023-12-23 | Source: Home / Self Care | Admitting: Internal Medicine

## 2023-12-23 ENCOUNTER — Encounter (HOSPITAL_COMMUNITY): Admission: RE | Source: Home / Self Care

## 2023-12-23 DIAGNOSIS — I4819 Other persistent atrial fibrillation: Secondary | ICD-10-CM

## 2023-12-23 SURGERY — CARDIOVERSION (CATH LAB)
Anesthesia: General

## 2023-12-23 NOTE — Telephone Encounter (Signed)
 Spoke with pt who states he is willing to reschedule DCCV.  Pt advised RN will reschedule appointment and contact him once this has been done.  Discussed importance of taking Eliquis  as prescribed in order to complete procedure.  Pt verbalizes understanding and agrees with current plan.

## 2023-12-24 NOTE — Telephone Encounter (Addendum)
 Spoke with pt and advised DCCV rescheduled to 11//3 at 830am.  Pt will need to see Charlies Arthur, PA-C 01/06/2024 with arrival at 830am to update H&P, labs and EKG.  Stressed importance of taking Eliquis  without interruption.  Pt verbalizes understanding and agrees with current plan.

## 2024-01-03 ENCOUNTER — Ambulatory Visit (HOSPITAL_COMMUNITY)
Admission: RE | Admit: 2024-01-03 | Discharge: 2024-01-03 | Disposition: A | Discharge status: Home / Self Care | Source: Ambulatory Visit | Attending: Cardiovascular Disease | Admitting: Cardiovascular Disease

## 2024-01-03 DIAGNOSIS — I4819 Other persistent atrial fibrillation: Secondary | ICD-10-CM | POA: Diagnosis not present

## 2024-01-03 DIAGNOSIS — I4891 Unspecified atrial fibrillation: Secondary | ICD-10-CM | POA: Insufficient documentation

## 2024-01-03 LAB — ECHOCARDIOGRAM COMPLETE
Area-P 1/2: 5.46 cm2
MV M vel: 5.32 m/s
MV Peak grad: 113.1 mmHg
Radius: 0.55 cm
S' Lateral: 2.4 cm

## 2024-01-05 NOTE — H&P (View-Only) (Signed)
 Cardiology Office Note:  .   Date:  01/05/2024  ID:  Corey Collier, DOB 1961-01-01, MRN [id number] PCP: Thedora Garnette HERO, MD  [facility] Providers Cardiologist:  None {  History of Present Illness: .   Corey Collier is a 63 y.o. male w/PMHx of  HTN, HLD CAD (2017: mid LAD 90% lesion stented)  He saw Dr. Stanford last back in 11/11/2018 Appears to have gotten LTFU since then Until this year  Saw Dr. Nancey 11/27/23 for management of AFib, likely persistent ASA stopped > Eliquis  Plan for DCCV once adequately a/c  Delayed 2/2 missed OAC doses  Scheduled for 01/27/24   Today's visit is scheduled as a revisit to update H&P, re-eval prior to DCCV ROS:   Generally with AFib feels less energy, less well. No CP Infrequently has an awareness of irregular heart beat No near syncope or syncope No bleeding or signs of bleeding  Reports since his last call with the office/reschedule of his DCCV, he has not missed any further doses of his Eliquis .  When walking feels like his legs get numb, not painful This was w/u by his PMD last year with normal ABIs   Arrhythmia/AAD hx AFib noted AUg 2024  Studies Reviewed: SABRA    EKG done today and reviewed by myself:  AFib 74bpm, PVC  01/03/24: TTE 1. Left ventricular ejection fraction, by estimation, is 65 to 70%. Left  ventricular ejection fraction by 3D volume is 71 %. The left ventricle has  normal function. The left ventricle has no regional wall motion  abnormalities. Left ventricular diastolic   parameters are indeterminate. The average left ventricular global  longitudinal strain is -17.7 %. The global longitudinal strain is normal.   2. Right ventricular systolic function is normal. The right ventricular  size is normal.   3. Left atrial size was mildly dilated.   4. Right atrial size was mildly dilated.   5. The mitral valve is normal in structure. Mild mitral valve  regurgitation. No evidence of mitral  stenosis.   6. The aortic valve is normal in structure. Aortic valve regurgitation is  not visualized. No aortic stenosis is present.   7. The inferior vena cava is normal in size with greater than 50%  respiratory variability, suggesting right atrial pressure of 3 mmHg.    Risk Assessment/Calculations:    Physical Exam:   VS:  There were no vitals taken for this visit.   Wt Readings from Last 3 Encounters:  11/27/23 185 lb (83.9 kg)  10/15/23 188 lb (85.3 kg)  06/17/23 192 lb 6.4 oz (87.3 kg)    GEN: Well nourished, well developed in no acute distress NECK: No JVD; No carotid bruits CARDIAC: irreg-irreg, no murmurs, rubs, gallops RESPIRATORY:  CTA b/l without rales, wheezing or rhonchi  ABDOMEN: Soft, non-tender, non-distended EXTREMITIES: No edema; No deformity + pedal pulses b/l L are better then the right, no cyanosis, feet are warm and appear well perfused  ASSESSMENT AND PLAN: .    persistent AFib CHA2DS2Vasc is 2, on Eliquis , appropriately dosed Rate controled Symptomatic  Discussed importance of Eliquis , it's role in stroke risk reduction with AFib and especially importance of compliance leading into his DCCV and afterwards.   CAD No symptoms Lipids/labs have been managed via his PMD Needs to re-engage with Dr. Beverlee  HTN Looks good  Secondary hypercoagulable state 2/2 AFib   Dispo: will get him re-established with Dr. Stanford team, pre-DCCV labs today, back with EP in 1-40mo,  sooner if needed  Signed, Corey Macario Arthur, PA-C

## 2024-01-05 NOTE — Progress Notes (Unsigned)
 Cardiology Office Note:  .   Date:  01/05/2024  ID:  Corey Collier, DOB 1961-01-01, MRN [id number] PCP: Thedora Garnette HERO, MD  [facility] Providers Cardiologist:  None {  History of Present Illness: .   Corey Collier is a 63 y.o. male w/PMHx of  HTN, HLD CAD (2017: mid LAD 90% lesion stented)  He saw Dr. Stanford last back in 11/11/2018 Appears to have gotten LTFU since then Until this year  Saw Dr. Nancey 11/27/23 for management of AFib, likely persistent ASA stopped > Eliquis  Plan for DCCV once adequately a/c  Delayed 2/2 missed OAC doses  Scheduled for 01/27/24   Today's visit is scheduled as a revisit to update H&P, re-eval prior to DCCV ROS:   Generally with AFib feels less energy, less well. No CP Infrequently has an awareness of irregular heart beat No near syncope or syncope No bleeding or signs of bleeding  Reports since his last call with the office/reschedule of his DCCV, he has not missed any further doses of his Eliquis .  When walking feels like his legs get numb, not painful This was w/u by his PMD last year with normal ABIs   Arrhythmia/AAD hx AFib noted AUg 2024  Studies Reviewed: SABRA    EKG done today and reviewed by myself:  AFib 74bpm, PVC  01/03/24: TTE 1. Left ventricular ejection fraction, by estimation, is 65 to 70%. Left  ventricular ejection fraction by 3D volume is 71 %. The left ventricle has  normal function. The left ventricle has no regional wall motion  abnormalities. Left ventricular diastolic   parameters are indeterminate. The average left ventricular global  longitudinal strain is -17.7 %. The global longitudinal strain is normal.   2. Right ventricular systolic function is normal. The right ventricular  size is normal.   3. Left atrial size was mildly dilated.   4. Right atrial size was mildly dilated.   5. The mitral valve is normal in structure. Mild mitral valve  regurgitation. No evidence of mitral  stenosis.   6. The aortic valve is normal in structure. Aortic valve regurgitation is  not visualized. No aortic stenosis is present.   7. The inferior vena cava is normal in size with greater than 50%  respiratory variability, suggesting right atrial pressure of 3 mmHg.    Risk Assessment/Calculations:    Physical Exam:   VS:  There were no vitals taken for this visit.   Wt Readings from Last 3 Encounters:  11/27/23 185 lb (83.9 kg)  10/15/23 188 lb (85.3 kg)  06/17/23 192 lb 6.4 oz (87.3 kg)    GEN: Well nourished, well developed in no acute distress NECK: No JVD; No carotid bruits CARDIAC: irreg-irreg, no murmurs, rubs, gallops RESPIRATORY:  CTA b/l without rales, wheezing or rhonchi  ABDOMEN: Soft, non-tender, non-distended EXTREMITIES: No edema; No deformity + pedal pulses b/l L are better then the right, no cyanosis, feet are warm and appear well perfused  ASSESSMENT AND PLAN: .    persistent AFib CHA2DS2Vasc is 2, on Eliquis , appropriately dosed Rate controled Symptomatic  Discussed importance of Eliquis , it's role in stroke risk reduction with AFib and especially importance of compliance leading into his DCCV and afterwards.   CAD No symptoms Lipids/labs have been managed via his PMD Needs to re-engage with Dr. Beverlee  HTN Looks good  Secondary hypercoagulable state 2/2 AFib   Dispo: will get him re-established with Dr. Stanford team, pre-DCCV labs today, back with EP in 1-40mo,  sooner if needed  Signed, [name], PA-C

## 2024-01-06 ENCOUNTER — Ambulatory Visit: Attending: Physician Assistant | Admitting: Physician Assistant

## 2024-01-06 ENCOUNTER — Ambulatory Visit: Admitting: Cardiovascular Disease

## 2024-01-06 VITALS — BP 126/74 | HR 74 | Ht 75.0 in | Wt 188.0 lb

## 2024-01-06 DIAGNOSIS — I4819 Other persistent atrial fibrillation: Secondary | ICD-10-CM | POA: Diagnosis not present

## 2024-01-06 DIAGNOSIS — Z79899 Other long term (current) drug therapy: Secondary | ICD-10-CM

## 2024-01-06 DIAGNOSIS — D6869 Other thrombophilia: Secondary | ICD-10-CM

## 2024-01-06 DIAGNOSIS — I2510 Atherosclerotic heart disease of native coronary artery without angina pectoris: Secondary | ICD-10-CM | POA: Diagnosis not present

## 2024-01-06 DIAGNOSIS — I10 Essential (primary) hypertension: Secondary | ICD-10-CM | POA: Diagnosis not present

## 2024-01-06 LAB — CBC

## 2024-01-06 NOTE — Patient Instructions (Signed)
 Medication Instructions:   Your physician recommends that you continue on your current medications as directed. Please refer to the Current Medication list given to you today.  *If you need a refill on your cardiac medications before your next appointment, please call your pharmacy*   Lab Work:   PLEASE GO DOWN STAIRS  [facility]  FIRST FLOOR   ( GET OFF ELEVATORS WALK TOWARDS WAITING AREA LAB LOCATED BY PHARMACY):  BMET AND CBC TODAY     If you have labs (blood work) drawn today and your tests are completely normal, you will receive your results only by: MyChart Message (if you have MyChart) OR A paper copy in the mail If you have any lab test that is abnormal or we need to change your treatment, we will call you to review the results.   Testing/Procedures: Your physician has recommended that you have a Cardioversion (DCCV). Electrical Cardioversion uses a jolt of electricity to your heart either through paddles or wired patches attached to your chest. This is a controlled, usually prescheduled, procedure. Defibrillation is done under light anesthesia in the hospital, and you usually go home the day of the procedure. This is done to get your heart back into a normal rhythm. You are not awake for the procedure. Please see the instruction sheet given to you today.    Follow-Up: At [facility], you and your health needs are our priority.  As part of our continuing mission to provide you with exceptional heart care, our providers are all part of one team.  This team includes your primary Cardiologist (physician) and Advanced Practice Providers or APPs (Physician Assistants and Nurse Practitioners) who all work together to provide you with the care you need, when you need it.   Your next appointment:   NEXT AVAILABLE WITH DR JEFFRIE / APP  ( REESTABLISH)    1 -2 month(s)  POST CARDIOVERSION   Provider:  Daphne Barrack, NP, Ozell Jodie Passey, PA-C, or Charlies Arthur, PA-C    We  recommend signing up for the patient portal called MyChart.  Sign up information is provided on this After Visit Summary.  MyChart is used to connect with patients for Virtual Visits (Telemedicine).  Patients are able to view lab/test results, encounter notes, upcoming appointments, etc.  Non-urgent messages can be sent to your provider as well.   To learn more about what you can do with MyChart, go to [email or web address].   Other Instructions

## 2024-01-07 ENCOUNTER — Ambulatory Visit: Payer: Self-pay | Admitting: Physician Assistant

## 2024-01-07 ENCOUNTER — Ambulatory Visit: Payer: Self-pay | Admitting: Cardiovascular Disease

## 2024-01-07 LAB — CBC
Hematocrit: 50.3 % (ref 37.5–51.0)
Hemoglobin: 16.7 g/dL (ref 13.0–17.7)
MCH: 33.1 pg — AB (ref 26.6–33.0)
MCHC: 33.2 g/dL (ref 31.5–35.7)
MCV: 100 fL — AB (ref 79–97)
Platelets: 209 x10E3/uL (ref 150–450)
RBC: 5.05 x10E6/uL (ref 4.14–5.80)
RDW: 12.8 % (ref 11.6–15.4)
WBC: 7.5 x10E3/uL (ref 3.4–10.8)

## 2024-01-07 LAB — BASIC METABOLIC PANEL WITH GFR
BUN/Creatinine Ratio: 11 (ref 10–24)
BUN: 12 mg/dL (ref 8–27)
CO2: 24 mmol/L (ref 20–29)
Calcium: 10.1 mg/dL (ref 8.6–10.2)
Chloride: 100 mmol/L (ref 96–106)
Creatinine, Ser: 1.12 mg/dL (ref 0.76–1.27)
Glucose: 104 mg/dL — ABNORMAL HIGH (ref 70–99)
Potassium: 4.1 mmol/L (ref 3.5–5.2)
Sodium: 140 mmol/L (ref 134–144)
eGFR: 74 mL/min/1.73 (ref >59)

## 2024-01-10 ENCOUNTER — Other Ambulatory Visit: Payer: Self-pay | Admitting: Family Medicine

## 2024-01-10 DIAGNOSIS — I10 Essential (primary) hypertension: Secondary | ICD-10-CM

## 2024-01-10 NOTE — Telephone Encounter (Signed)
 Refill request for  Amlodipine   LR  prior PCP LOV  10/15/23 FOV  01/15/2024   Please review and advise.  Thanks. Dm/cma

## 2024-01-15 ENCOUNTER — Other Ambulatory Visit: Payer: Self-pay | Admitting: Family Medicine

## 2024-01-15 ENCOUNTER — Ambulatory Visit: Admitting: Family Medicine

## 2024-01-15 ENCOUNTER — Encounter: Payer: Self-pay | Admitting: Family Medicine

## 2024-01-15 ENCOUNTER — Other Ambulatory Visit (HOSPITAL_COMMUNITY): Payer: Self-pay

## 2024-01-15 VITALS — BP 120/78 | HR 68 | Temp 97.4°F | Ht 75.0 in | Wt 186.4 lb

## 2024-01-15 DIAGNOSIS — Z720 Tobacco use: Secondary | ICD-10-CM

## 2024-01-15 DIAGNOSIS — I4891 Unspecified atrial fibrillation: Secondary | ICD-10-CM | POA: Diagnosis not present

## 2024-01-15 DIAGNOSIS — I10 Essential (primary) hypertension: Secondary | ICD-10-CM

## 2024-01-15 DIAGNOSIS — E782 Mixed hyperlipidemia: Secondary | ICD-10-CM

## 2024-01-15 DIAGNOSIS — F4329 Adjustment disorder with other symptoms: Secondary | ICD-10-CM

## 2024-01-15 DIAGNOSIS — I2510 Atherosclerotic heart disease of native coronary artery without angina pectoris: Secondary | ICD-10-CM

## 2024-01-15 MED ORDER — VARENICLINE TARTRATE 0.5 MG PO TABS: ORAL_TABLET | ORAL | 0 refills | Status: AC

## 2024-01-15 MED ORDER — VARENICLINE TARTRATE 1 MG PO TABS: 1.0000 mg | ORAL_TABLET | Freq: Two times a day (BID) | ORAL | 5 refills | Status: DC

## 2024-01-15 NOTE — Assessment & Plan Note (Signed)
 I strongly advise smoking cessation, esp. in light of CAD, hypertension, emphysema, and atrial fibrillation. He is feeling more motivated to try Chantix .  If his insurance does not cover Chantix , I discussed with him about the use of a Good Rx card to get a discount price, pointing out he is currently spending ~ $360/mo on cigarettes.  I spent 5 minutes counseling the patient about tobacco cessation.

## 2024-01-15 NOTE — Assessment & Plan Note (Signed)
 Blood pressure is in good control. Continue amlodipine 10 mg daily and atenolol 100 mg daily.

## 2024-01-15 NOTE — Assessment & Plan Note (Signed)
 Stable. Denies angina. Continue atenolol  100 mg daily and aspirin  81 mg daily and PRN NTG. I strongly advise smoking cessation.

## 2024-01-15 NOTE — Assessment & Plan Note (Signed)
 Corey Collier is scheduled for a cardioversion related to his recent diagnosis of atrial fibrillation. He will continue atenolol  100 mg daily for rate control and apixaban  5 mg bid for stroke prevention.

## 2024-01-15 NOTE — Assessment & Plan Note (Signed)
 LDL cholesterol is at goal. Continue atorvastatin  40 mg daily.

## 2024-01-15 NOTE — Progress Notes (Signed)
 [facility]IMARY [facility]ARE [facility] [address] [place] [address] Dept: [phone] Dept Fax: [phone]  Chronic Care Office Visit  Subjective:    Patient ID: Corey Collier, male    DOB: 1960/03/28, 63 y.o..   MRN: [id number]  Chief Complaint  Patient presents with   Hypertension    3 month f/u HTN.  No concerns.  Declines flu shot.    History of Present Illness:  Patient is in today for reassessment of chronic medical issues.  Corey Collier smokes 1 to 1 1/2 ppd of cigarettes for many years. He continues to contemplate quitting. He is under the impression that his insurance does not cover Chantix .   Corey Collier has a history of coronary artery disease. He had a coronary stent placed in 2017. He denies any chest pain. He is managed on a daily aspirin  and atenolol  100 mg daily. At his last visit, I diagnosed him with new onset of atrial fibrillation. He has seen cardiology. He is now on apixaban  for stroke prevention. He is scheduled for a cardioversion on 11/3. He had been scheduled earlier, but had missed some doses of his anticoagulant.   Corey Collier has a history of hypertension. He is managed on amlodipine  10 mg daily and atenolol  100 mg daily.   Corey Collier has a history of hyperlipidemia. He is managed on atorvastatin  40 mg daily.   Corey Collier has been noting he is more on edge in the past few months. He feels like her more easily snaps at coworkers and at home. He was concerned that one of his medicines is making him feel this way. He does admit to stressors at work that are worrisome.   Past Medical History: Patient Active Problem List   Diagnosis Date Noted   Atrial fibrillation (HCC) 10/15/2023   Angina pectoris 02/06/2022   Aortic atherosclerosis (HCC) by CT scan 12/2019 01/02/2020   Emphysema lung (HCC) 01/02/2020   Excessive drinking of alcohol 12/22/2019   BPH with obstruction/lower urinary tract symptoms 08/26/2018    History of smoking 30 or more pack years (42) 04/28/2018   Essential hypertension 01/24/2016   Hyperlipidemia, mixed 01/24/2016   Vitamin D  deficiency 01/24/2016   Hypertensive heart disease    Tobacco abuse    GERD (gastroesophageal reflux disease)    Coronary artery disease    Past Surgical History:  Procedure Laterality Date   CARDIAC CATHETERIZATION N/A 08/11/2015   Procedure: Left Heart Cath and Coronary Angiography;  Surgeon: Oneil JAYSON Parchment, MD;  Location: [facility] INVASIVE CV LAB;  Service: Cardiovascular;  Laterality: N/A;   CARDIAC CATHETERIZATION N/A 08/11/2015   Procedure: Coronary Stent Intervention;  Surgeon: Oneil JAYSON Parchment, MD;  Location: [facility] INVASIVE CV LAB;  Service: Cardiovascular;  Laterality: N/A;   CARDIAC CATHETERIZATION N/A 08/11/2015   Procedure: Coronary Stent Intervention;  Surgeon: Candyce GORMAN Reek, MD;  Location: [facility] INVASIVE CV LAB;  Service: Cardiovascular;  Laterality: N/A;   LUMBAR DISC SURGERY     Family History  Problem Relation Age of Onset   Heart disease Mother    Stroke Father    Peripheral Artery Disease Sister    Peripheral Artery Disease Brother    Outpatient Medications Prior to Visit  Medication Sig Dispense Refill   amLODipine  (NORVASC ) 10 MG tablet TAKE 1 TABLET BY MOUTH EVERY DAY FOR BLOOD PRESSURE 90 tablet 3   apixaban  (ELIQUIS ) 5 MG TABS tablet Take 1 tablet (5 mg total) by mouth 2 (two) times daily. 180 tablet  1   atenolol  (TENORMIN ) 100 MG tablet Take 1 tablet (100 mg total) by mouth daily. 90 tablet 3   atorvastatin  (LIPITOR) 40 MG tablet Take 1 tablet (40 mg total) by mouth daily. 90 tablet 3   Cholecalciferol 50 MCG (2000 UT) TABS Take 6,000 Units by mouth daily.     nitroGLYCERIN  (NITROSTAT ) 0.4 MG SL tablet Place 1 tablet (0.4 mg total) under the tongue every 5 (five) minutes as needed for chest pain. 25 tablet 6   pantoprazole  (PROTONIX ) 40 MG tablet Take 1 tablet (40 mg total) by mouth daily. 90 tablet 3   zinc  gluconate 50 MG  tablet Take 1 tablet (50 mg total) by mouth daily.     tamsulosin  (FLOMAX ) 0.4 MG CAPS capsule TAKE 1 CAPSULE BY MOUTH EVERY DAY (Patient not taking: Reported on 01/15/2024) 90 capsule 1   varenicline  (CHANTIX ) 1 MG tablet Take 1 tablet (1 mg total) by mouth 2 (two) times daily. Maintenance dose after initial week. (Patient not taking: Reported on 01/06/2024) 180 tablet 1   No facility-administered medications prior to visit.   No Known Allergies Objective:   Today's Vitals   01/15/24 1010  BP: 120/78  Pulse: 68  Temp: (!) 97.4 F (36.3 C)  TempSrc: Temporal  SpO2: 99%  Weight: 186 lb 6.4 oz (84.6 kg)  Height: 6' 3 (1.905 m)   Body mass index is 23.3 kg/m.   General: Well developed, well nourished. No acute distress. Psych: Alert and oriented. Normal mood and affect.  Health Maintenance Due  Topic Date Due   HIV Screening  Never done   Hepatitis C Screening  Never done   Pneumococcal Vaccine: 50+ Years (1 of 2 - PCV) Never done   Zoster Vaccines- Shingrix (1 of 2) Never done   Lung Cancer Screening  01/17/2024   Imaging: Echocardiogram (12/24/2023) IMPRESSIONS   1. Left ventricular ejection fraction, by estimation, is 65 to 70%. Left ventricular ejection fraction by 3D volume is 71 %. The left ventricle has normal function. The left ventricle has no regional wall motion abnormalities. Left ventricular diastolic parameters are indeterminate. The average left ventricular global longitudinal strain is -17.7 %. The global longitudinal strain is normal.   2. Right ventricular systolic function is normal. The right ventricular size is normal.   3. Left atrial size was mildly dilated.   4. Right atrial size was mildly dilated.   5. The mitral valve is normal in structure. Mild mitral valve regurgitation. No evidence of mitral stenosis.   6. The aortic valve is normal in structure. Aortic valve regurgitation is not visualized. No aortic stenosis is present.   7. The inferior vena  cava is normal in size with greater than 50% respiratory variability, suggesting right atrial pressure of 3 mmHg.    Assessment & Plan:   Problem List Items Addressed This Visit       Cardiovascular and Mediastinum   Atrial fibrillation [facility])   Corey Collier is scheduled for a cardioversion related to his recent diagnosis of atrial fibrillation. He will continue atenolol  100 mg daily for rate control and apixaban  5 mg bid for stroke prevention.      Coronary artery disease   Stable. Denies angina. Continue atenolol  100 mg daily and aspirin  81 mg daily and PRN NTG. I strongly advise smoking cessation.      Essential hypertension - Primary   Blood pressure is in good control. Continue amlodipine  10 mg daily and atenolol  100 mg daily.  Other   Hyperlipidemia, mixed   LDL cholesterol is at goal. Continue atorvastatin  40 mg daily.      Tobacco abuse   I strongly advise smoking cessation, esp. in light of CAD, hypertension, emphysema, and atrial fibrillation. He is feeling more motivated to try Chantix .  If his insurance does not cover Chantix , I discussed with him about the use of a Good Rx card to get a discount price, pointing out he is currently spending ~ $360/mo on cigarettes.  I spent 5 minutes counseling the patient about tobacco cessation.       Relevant Medications   varenicline  (CHANTIX ) 0.5 MG tablet   varenicline  (CHANTIX ) 1 MG tablet   Other Visit Diagnoses       Stress and adjustment reaction       I suspect the mood issues he is feelign are related to his medical issues and work stressors. I offered referral for counseling, but he declined. Will follow.       Return in about 3 months (around 04/16/2024) for Reassessment.   Garnette CHRISTELLA Simpler, MD

## 2024-01-24 ENCOUNTER — Telehealth: Payer: Self-pay | Admitting: Cardiovascular Disease

## 2024-01-24 NOTE — Progress Notes (Signed)
 Pt called for pre procedure instructions.  Message left on identified voicemail. Encouraged to call back. Arrival time 0730 NPO after midnight explained Instructed to take am meds with sip of water and confirmed blood thinner consistency Instructed pt need for ride home tomorrow and have responsible adult with them for 24 hrs post procedure.

## 2024-01-24 NOTE — Telephone Encounter (Signed)
 Spoke with pt, questions regarding DCCV 01/27/24 answered.

## 2024-01-24 NOTE — Telephone Encounter (Signed)
 Patient wants a call back regarding instructions for procedure scheduled on Monday, 11/3.

## 2024-01-26 NOTE — Anesthesia Preprocedure Evaluation (Signed)
 Anesthesia Evaluation  Patient identified by MRN, date of birth, ID band Patient awake    Reviewed: Allergy & Precautions, NPO status , Patient's Chart, lab work & pertinent test results, reviewed documented beta blocker date and time   History of Anesthesia Complications Negative for: history of anesthetic complications  Airway Mallampati: II  TM Distance: >3 FB Neck ROM: Full    Dental  (+) Dental Advisory Given   Pulmonary COPD, Current SmokerPatient did not abstain from smoking.   Pulmonary exam normal        Cardiovascular hypertension, Pt. on medications and Pt. on home beta blockers + CAD and + Cardiac Stents  Normal cardiovascular exam+ dysrhythmias Atrial Fibrillation    '25 TTE - EF 65 to 70%. Left atrial size was mildly dilated. Right atrial size was mildly dilated. Mild MR.     Neuro/Psych negative neurological ROS  negative psych ROS   GI/Hepatic ,GERD  Medicated and Controlled,,(+)     substance abuse  alcohol use  Endo/Other  negative endocrine ROS    Renal/GU negative Renal ROS     Musculoskeletal negative musculoskeletal ROS (+)    Abdominal   Peds  Hematology  On eliquis     Anesthesia Other Findings   Reproductive/Obstetrics                              Anesthesia Physical Anesthesia Plan  ASA: 3  Anesthesia Plan: General   Post-op Pain Management: Minimal or no pain anticipated   Induction: Intravenous  PONV Risk Score and Plan: 1 and Treatment may vary due to age or medical condition and Propofol infusion  Airway Management Planned: Natural Airway and Mask  Additional Equipment: None  Intra-op Plan:   Post-operative Plan:   Informed Consent: I have reviewed the patients History and Physical, chart, labs and discussed the procedure including the risks, benefits and alternatives for the proposed anesthesia with the patient or authorized representative  who has indicated his/her understanding and acceptance.       Plan Discussed with: CRNA and Anesthesiologist  Anesthesia Plan Comments:          Anesthesia Quick Evaluation

## 2024-01-27 ENCOUNTER — Ambulatory Visit (HOSPITAL_COMMUNITY): Admitting: Anesthesiology

## 2024-01-27 ENCOUNTER — Other Ambulatory Visit: Payer: Self-pay

## 2024-01-27 ENCOUNTER — Ambulatory Visit (HOSPITAL_COMMUNITY)
Admission: RE | Admit: 2024-01-27 | Discharge: 2024-01-27 | Disposition: A | Discharge status: Home / Self Care | Attending: Student in an Organized Health Care Education/Training Program | Admitting: Student in an Organized Health Care Education/Training Program

## 2024-01-27 ENCOUNTER — Encounter (HOSPITAL_COMMUNITY)
Admission: RE | Disposition: A | Discharge status: Home / Self Care | Payer: Self-pay | Source: Home / Self Care | Attending: Student in an Organized Health Care Education/Training Program

## 2024-01-27 DIAGNOSIS — I2510 Atherosclerotic heart disease of native coronary artery without angina pectoris: Secondary | ICD-10-CM

## 2024-01-27 DIAGNOSIS — I4819 Other persistent atrial fibrillation: Secondary | ICD-10-CM | POA: Insufficient documentation

## 2024-01-27 DIAGNOSIS — I4891 Unspecified atrial fibrillation: Secondary | ICD-10-CM

## 2024-01-27 DIAGNOSIS — F17210 Nicotine dependence, cigarettes, uncomplicated: Secondary | ICD-10-CM | POA: Diagnosis not present

## 2024-01-27 DIAGNOSIS — D6869 Other thrombophilia: Secondary | ICD-10-CM | POA: Diagnosis not present

## 2024-01-27 DIAGNOSIS — Z955 Presence of coronary angioplasty implant and graft: Secondary | ICD-10-CM | POA: Diagnosis not present

## 2024-01-27 DIAGNOSIS — F17200 Nicotine dependence, unspecified, uncomplicated: Secondary | ICD-10-CM | POA: Diagnosis not present

## 2024-01-27 DIAGNOSIS — J449 Chronic obstructive pulmonary disease, unspecified: Secondary | ICD-10-CM | POA: Diagnosis not present

## 2024-01-27 DIAGNOSIS — K219 Gastro-esophageal reflux disease without esophagitis: Secondary | ICD-10-CM | POA: Insufficient documentation

## 2024-01-27 DIAGNOSIS — Z7901 Long term (current) use of anticoagulants: Secondary | ICD-10-CM | POA: Insufficient documentation

## 2024-01-27 DIAGNOSIS — Z006 Encounter for examination for normal comparison and control in clinical research program: Secondary | ICD-10-CM

## 2024-01-27 DIAGNOSIS — I119 Hypertensive heart disease without heart failure: Secondary | ICD-10-CM | POA: Diagnosis not present

## 2024-01-27 DIAGNOSIS — E785 Hyperlipidemia, unspecified: Secondary | ICD-10-CM | POA: Diagnosis not present

## 2024-01-27 HISTORY — PX: CARDIOVERSION: EP1203

## 2024-01-27 SURGERY — CARDIOVERSION (CATH LAB)
Anesthesia: General

## 2024-01-27 MED ORDER — SODIUM CHLORIDE 0.9 % IV SOLN: INTRAVENOUS | Status: DC

## 2024-01-27 MED ORDER — LIDOCAINE 2% (20 MG/ML) 5 ML SYRINGE
INTRAMUSCULAR | Status: DC | PRN
  Administered 2024-01-27: 60 mg via INTRAVENOUS

## 2024-01-27 MED ORDER — PROPOFOL 10 MG/ML IV BOLUS
INTRAVENOUS | Status: DC | PRN
  Administered 2024-01-27: 90 mg via INTRAVENOUS

## 2024-01-27 SURGICAL SUPPLY — 1 items: PAD DEFIB RADIO PHYSIO CONN (PAD) ×1 IMPLANT

## 2024-01-27 NOTE — CV Procedure (Signed)
   CARDIOVERSION    Procedure:   DCCV  Indication:  Symptomatic atrial fibrillation  Procedure Note:  The patient signed informed consent.  The patient has had therapeutic oral anticoagulation uninterrupted for greater than 3 weeks.  Anesthesia was administered per the anesthesia team.  Adequate airway was maintained throughout and vital followed per protocol.  The patient was cardioverted x 1 with 360J of biphasic synchronized energy.  The patient converted to NSR.  There were no apparent complications.  The patient had normal neuro status and respiratory status post procedure with vitals stable as recorded elsewhere.     Ariyel Jeangilles T. Floretta HEATH, MD   [facility]  8:49 AM

## 2024-01-27 NOTE — Research (Signed)
 [facility] Cardioversion Informed Consent   Subject Name: Corey Collier  Subject met inclusion and exclusion criteria.  The informed consent form, study requirements and expectations were reviewed with the subject and questions and concerns were addressed prior to the signing of the consent form.  The subject verbalized understanding of the trial requirements.  The subject agreed to participate in the [facility] Cardioversion trial and signed the informed consent at 0745 on 03/Nov/2025.  The informed consent was obtained prior to performance of any protocol-specific procedures for the subject.  A copy of the signed informed consent was given to the subject and a copy was placed in the subject's medical record.   [name] [name]

## 2024-01-27 NOTE — Interval H&P Note (Signed)
 History and Physical Interval Note:  01/27/2024 8:16 AM  Corey Collier  has presented today for surgery, with the diagnosis of afib.  The various methods of treatment have been discussed with the patient and family. After consideration of risks, benefits and other options for treatment, the patient has consented to  Procedure(s): CARDIOVERSION (N/A) as a surgical intervention.  The patient's history has been reviewed, patient examined, no change in status, stable for surgery.  I have reviewed the patient's chart and labs.  Questions were answered to the patient's satisfaction.     Georganna Archer

## 2024-01-27 NOTE — Transfer of Care (Signed)
 Immediate Anesthesia Transfer of Care Note  Patient: Corey Collier  Procedure(s) Performed: CARDIOVERSION  Patient Location: PACU  Anesthesia Type:MAC  Level of Consciousness: awake and drowsy  Airway & Oxygen Therapy: Patient Spontanous Breathing and Patient connected to nasal cannula oxygen  Post-op Assessment: Report given to RN and Post -op Vital signs reviewed and stable  Post vital signs: Reviewed and stable  Last Vitals:  Vitals Value Taken Time  BP    Temp    Pulse    Resp    SpO2      Last Pain:  Vitals:   01/27/24 0741  TempSrc:   PainSc: 0-No pain         Complications: No notable events documented.

## 2024-01-27 NOTE — Anesthesia Postprocedure Evaluation (Signed)
 Anesthesia Post Note  Patient: Corey Collier  Procedure(s) Performed: CARDIOVERSION     Patient location during evaluation: PACU Anesthesia Type: General Level of consciousness: awake and alert Pain management: pain level controlled Vital Signs Assessment: post-procedure vital signs reviewed and stable Respiratory status: spontaneous breathing, nonlabored ventilation and respiratory function stable Cardiovascular status: stable and blood pressure returned to baseline Anesthetic complications: no   No notable events documented.  Last Vitals:  Vitals:   01/27/24 0855 01/27/24 0900  BP: (!) 134/90 134/83  Pulse: (!) 47 (!) 43  Resp: 13 13  Temp:    SpO2: 98% 100%    Last Pain:  Vitals:   01/27/24 0855  TempSrc:   PainSc: 0-No pain                 Debby FORBES Like

## 2024-01-28 ENCOUNTER — Encounter (HOSPITAL_COMMUNITY): Payer: Self-pay | Admitting: Student in an Organized Health Care Education/Training Program

## 2024-03-01 NOTE — Progress Notes (Unsigned)
 Electrophysiology Office Note:    Date:  03/02/2024   ID:  Steven Basso, DOB 1960-06-19, MRN [id number]  PCP:  Thedora Garnette HERO, MD   [facility] Providers Cardiologist:  None     Referring MD: Thedora Garnette HERO, MD   History of Present Illness:    Menno Vanbergen is a 63 y.o. male with a medical history significant for atrial fibrillation, cigarette smoking, hypertension, hyperlipidemia, referred for arrhythmia management.      History of Present Illness Arick Mareno is a 63 year old male with atrial fibrillation who presents for evaluation of his heart rhythm disorder. He was referred by Dr. Thedora for evaluation of atrial fibrillation.  Atrial fibrillation was first identified on an EKG on November 20, 2022. He previously experienced irregular heartbeats, particularly when lying down, but these have not been felt recently. He sometimes experiences fatigue and leg tiredness during walking.  He underwent a PCI in 2017 due to angina and fatigue, which revealed a blockage treated with a stent. He has coronary artery disease and hypertension. He is not currently on a blood thinner but has been taking baby aspirin , which he has recently run out of.  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 He lives in [place].  He underwent cardioversion on January 27, 2024 but had recurrence of atrial fibrillation in the interim.  He did not notice significant improvement in symptoms.         Today, he reports that he is at baseline and has no complaints  EKGs/Labs/Other Studies Reviewed Today:     Echocardiogram:  TTE January 03, 2024 LVEF 65 to 70%.  Mildly dilated left and right atrium.  Normal mitral valve.    Cardiac catherization  May 2017 PCI to mid LAD  EKG:   EKG Interpretation Date/Time:  Monday March 02 2024 09:44:51 EST Ventricular Rate:  89 PR Interval:    QRS Duration:  86 QT Interval:  350 QTC  Calculation: 425 R Axis:   87  Text Interpretation: Atrial fibrillation with premature ventricular or aberrantly conducted complexes Nonspecific ST abnormality When compared with ECG of 27-Jan-2024 08:57, Atrial fibrillation has replaced Sinus rhythm Vent. rate has increased BY  44 BPM ST now depressed in Inferior leads ST no longer elevated in Lateral leads Confirmed by Nancey Scotts [phone]) on 03/02/2024 10:07:43 AM     Physical Exam:    VS:  BP 136/70   Pulse (!) 101   Ht 6' 3 (1.905 m)   Wt 190 lb (86.2 kg)   SpO2 97%   BMI 23.75 kg/m     Wt Readings from Last 3 Encounters:  03/02/24 190 lb (86.2 kg)  01/15/24 186 lb 6.4 oz (84.6 kg)  01/06/24 188 lb (85.3 kg)     GEN: Well nourished, well developed in no acute distress CARDIAC: iRRR, no murmurs, rubs, gallops RESPIRATORY:  Normal work of breathing MUSCULOSKELETAL: no edema    ASSESSMENT & PLAN:     Atrial fibrillation -- likely persistent Symptoms, if any, are mild -- no palpitations, noticeable fatigue Continue Eliquis  5 mg p.o. twice daily We discussed the atrial fibrillation procedure.  I explained risks which include but are not limited to stroke, heart attack, need for prolonged hospitalization or surgery, and a low but nonzero risk of death.  He is interested but would like to obtain more information about the finances. He will call us  in the future when he is ready to schedule.  Hypertension BP slightly above goal today On amlodipine  10 mg daily and atenolol  100 mg daily  Coronary artery disease Status post PCI to mid LAD in 2017 Stable, no angina  Cigarette smoking Advised to quit smoking  Secondary hypercoagulable state CHA2DS2-VASc score is 2 for coronary disease and hypertension Continue Eliquis  5 mg p.o. twice daily    Signed, Eulas FORBES Furbish, MD  03/02/2024 10:13 AM    [facility]

## 2024-03-02 ENCOUNTER — Ambulatory Visit: Attending: Cardiovascular Disease | Admitting: Cardiovascular Disease

## 2024-03-02 ENCOUNTER — Encounter: Payer: Self-pay | Admitting: Cardiovascular Disease

## 2024-03-02 VITALS — BP 136/70 | HR 101 | Ht 75.0 in | Wt 190.0 lb

## 2024-03-02 DIAGNOSIS — I2510 Atherosclerotic heart disease of native coronary artery without angina pectoris: Secondary | ICD-10-CM | POA: Diagnosis not present

## 2024-03-02 NOTE — Patient Instructions (Signed)
 Medication Instructions:  Your physician recommends that you continue on your current medications as directed. Please refer to the Current Medication list given to you today.  *If you need a refill on your cardiac medications before your next appointment, please call your pharmacy*  Lab Work: None ordered.  If you have labs (blood work) drawn today and your tests are completely normal, you will receive your results only by: MyChart Message (if you have MyChart) OR A paper copy in the mail If you have any lab test that is abnormal or we need to change your treatment, we will call you to review the results.  Testing/Procedures: Your physician has recommended that you have an ablation. Catheter ablation is a medical procedure used to treat some cardiac arrhythmias (irregular heartbeats). During catheter ablation, a long, thin, flexible tube is put into a blood vessel in your groin (upper thigh), or neck. This tube is called an ablation catheter. It is then guided to your heart through the blood vessel. Radio frequency waves destroy small areas of heart tissue where abnormal heartbeats may cause an arrhythmia to start. Please see the instruction sheet given to you today.   Follow-Up: At [facility], you and your health needs are our priority.  As part of our continuing mission to provide you with exceptional heart care, our providers are all part of one team.  This team includes your primary Cardiologist (physician) and Advanced Practice Providers or APPs (Physician Assistants and Nurse Practitioners) who all work together to provide you with the care you need, when you need it.  Your next appointment:   Please contact our office if you decide to move forward with the ablation.  You will need to contact your insurance company to find out what your copay will be.  We recommend signing up for the patient portal called MyChart.  Sign up information is provided on this After Visit Summary.   MyChart is used to connect with patients for Virtual Visits (Telemedicine).  Patients are able to view lab/test results, encounter notes, upcoming appointments, etc.  Non-urgent messages can be sent to your provider as well.   To learn more about what you can do with MyChart, go to [email or web address].

## 2024-03-08 NOTE — Progress Notes (Deleted)
 Cardiology Office Note:  .   Date:  03/08/2024  ID:  Corey Collier, DOB April 01, 1960, MRN [id number] PCP: Thedora Garnette HERO, MD  [facility] Providers Cardiologist:  None {  History of Present Illness: .   Corey Collier is a 63 y.o. male w/PMHx of  HTN, HLD CAD (2017: mid LAD 90% lesion stented)  He saw Dr. Stanford last back in 11/11/2018 Appears to have gotten LTFU since then Until this year  Saw Dr. Nancey 11/27/23 for management of AFib, likely persistent ASA stopped > Eliquis  Plan for DCCV once adequately a/c  Delayed 2/2 missed OAC doses>>> Scheduled for 01/27/24  I saw him 01/16/24 Generally with AFib feels less energy, less well. No CP Infrequently has an awareness of irregular heart beat No near syncope or syncope No bleeding or signs of bleeding Reports since his last call with the office/reschedule of his DCCV, he has not missed any further doses of his Eliquis .  DCCV 01/27/24 > SR  He saw Dr. Nancey 03/02/24, reported no real improvement in symptoms, well being when in SR. Did discuss ablation, the patient wante dto look into finances of it for him.  Today's visit is scheduled as a post DCCV visit ROS:   *** eliquis , dose, bleeding, labs *** symptoms *** needs gen cards, last was 2020 (skains) *** symptoms    Arrhythmia/AAD hx AFib noted Aug 2024  Studies Reviewed: SABRA    EKG done today and reviewed by myself:  ***  01/03/24: TTE 1. Left ventricular ejection fraction, by estimation, is 65 to 70%. Left  ventricular ejection fraction by 3D volume is 71 %. The left ventricle has  normal function. The left ventricle has no regional wall motion  abnormalities. Left ventricular diastolic   parameters are indeterminate. The average left ventricular global  longitudinal strain is -17.7 %. The global longitudinal strain is normal.   2. Right ventricular systolic function is normal. The right ventricular  size is normal.   3. Left atrial size  was mildly dilated.   4. Right atrial size was mildly dilated.   5. The mitral valve is normal in structure. Mild mitral valve  regurgitation. No evidence of mitral stenosis.   6. The aortic valve is normal in structure. Aortic valve regurgitation is  not visualized. No aortic stenosis is present.   7. The inferior vena cava is normal in size with greater than 50%  respiratory variability, suggesting right atrial pressure of 3 mmHg.    Risk Assessment/Calculations:    Physical Exam:   VS:  There were no vitals taken for this visit.   Wt Readings from Last 3 Encounters:  03/02/24 190 lb (86.2 kg)  01/15/24 186 lb 6.4 oz (84.6 kg)  01/06/24 188 lb (85.3 kg)    GEN: Well nourished, well developed in no acute distress NECK: No JVD; No carotid bruits CARDIAC: ***, no murmurs, rubs, gallops RESPIRATORY:  *** CTA b/l without rales, wheezing or rhonchi  ABDOMEN: Soft, non-tender, non-distended EXTREMITIES: *** No edema; No deformity + pedal pulses b/l L are better then the right, no cyanosis, feet are warm and appear well perfused  ASSESSMENT AND PLAN: .    persistent AFib CHA2DS2Vasc is 2, on Eliquis , *** appropriately dosed ***   CAD *** No symptoms Lipids/labs have been managed via his PMD C/w with Dr. Beverlee  HTN *** Looks good  Secondary hypercoagulable state 2/2 AFib   Dispo: *** sooner if needed  Signed, [name], PA-C

## 2024-03-09 ENCOUNTER — Ambulatory Visit: Admitting: Physician Assistant

## 2024-03-30 ENCOUNTER — Ambulatory Visit: Attending: Cardiology | Admitting: Cardiology

## 2024-04-16 ENCOUNTER — Ambulatory Visit: Admitting: Family Medicine

## 2024-04-16 NOTE — Assessment & Plan Note (Addendum)
 Stable. Denies angina. Continue and aspirin  81 mg daily and PRN NTG. I strongly advise smoking cessation. Reported no longer taking atenolol  100 mg daily

## 2024-04-16 NOTE — Assessment & Plan Note (Signed)
 I strongly advise smoking cessation, esp. in light of CAD, hypertension, emphysema, and atrial fibrillation. He is feeling more motivated to try Chantix .  If his insurance does not cover Chantix , I discussed with him about the use of a Good Rx card to get a discount price, pointing out he is currently spending ~ $360/mo on cigarettes.  I spent 5 minutes counseling the patient about tobacco cessation.

## 2024-04-16 NOTE — Progress Notes (Incomplete)
 " [facility] PRIMARY CARE [facility] [address] [place] [address] Dept: [phone] Dept Fax: [phone]  Chronic Care Office Visit  Subjective:    Patient ID: Corey Collier, male    DOB: Apr 17, 1960, 64 y.o..   MRN: [id number]  No chief complaint on file.  History of Present Illness:  Patient is in today for reassessment of chronic medical conditions.   [name] smokes 1 to 1 1/2 ppd of cigarettes for many years. He continues to contemplate quitting. He is under the impression that his insurance does not cover Chantix .   [name] has a history of coronary artery disease. He had a coronary stent placed in 2017. He denies any chest pain. He is managed on a daily aspirin . Reported 03/02/2024 that he is not taking atenolol  100 mg daily. I have also diagnosed him with new onset of atrial fibrillation. He has seen cardiology. He is now on apixaban  for stroke prevention. S/p cardioversion on 11/3.    [name] has a history of hypertension. He is managed on amlodipine  10 mg daily. Reported 03/02/2024 that he is not taking atenolol  100 mg daily.    [name] has a history of hyperlipidemia. He is managed on atorvastatin  40 mg daily.   Past Medical History: Patient Active Problem List   Diagnosis Date Noted   Atrial fibrillation (HCC) 10/15/2023   Angina pectoris 02/06/2022   Aortic atherosclerosis (HCC) by CT scan 12/2019 01/02/2020   Emphysema lung (HCC) 01/02/2020   Excessive drinking of alcohol 12/22/2019   BPH with obstruction/lower urinary tract symptoms 08/26/2018   History of smoking 30 or more pack years (42) 04/28/2018   Essential hypertension 01/24/2016   Hyperlipidemia, mixed 01/24/2016   Vitamin D  deficiency 01/24/2016   Hypertensive heart disease    Tobacco abuse    GERD (gastroesophageal reflux disease)    Coronary artery disease    Past Surgical History:  Procedure Laterality Date   CARDIAC CATHETERIZATION N/A  08/11/2015   Procedure: Left Heart Cath and Coronary Angiography;  Surgeon: Corey Corey Parchment, MD;  Location: [facility] INVASIVE CV LAB;  Service: Cardiovascular;  Laterality: N/A;   CARDIAC CATHETERIZATION N/A 08/11/2015   Procedure: Coronary Stent Intervention;  Surgeon: Corey Corey Parchment, MD;  Location: [facility] INVASIVE CV LAB;  Service: Cardiovascular;  Laterality: N/A;   CARDIAC CATHETERIZATION N/A 08/11/2015   Procedure: Coronary Stent Intervention;  Surgeon: Corey Corey Reek, MD;  Location: [facility] INVASIVE CV LAB;  Service: Cardiovascular;  Laterality: N/A;   CARDIOVERSION N/A 01/27/2024   Procedure: CARDIOVERSION;  Surgeon: Corey Mallard, MD;  Location: [facility] INVASIVE CV LAB;  Service: Cardiovascular;  Laterality: N/A;   LUMBAR DISC SURGERY     Family History  Problem Relation Age of Onset   Heart disease Mother    Stroke Father    Peripheral Artery Disease Sister    Peripheral Artery Disease Brother    Outpatient Medications Prior to Visit  Medication Sig Dispense Refill   amLODipine  (NORVASC ) 10 MG tablet TAKE 1 TABLET BY MOUTH EVERY DAY FOR BLOOD PRESSURE 90 tablet 3   apixaban  (ELIQUIS ) 5 MG TABS tablet Take 1 tablet (5 mg total) by mouth 2 (two) times daily. 180 tablet 1   atenolol  (TENORMIN ) 100 MG tablet Take 1 tablet (100 mg total) by mouth daily. (Patient not taking: Reported on 03/02/2024) 90 tablet 3   atorvastatin  (LIPITOR) 40 MG tablet Take 1 tablet (40 mg total) by mouth daily. (Patient taking differently: Take 20 mg by mouth  daily.) 90 tablet 3   Cholecalciferol (DIALYVITE VITAMIN D  5000 PO) Take by mouth 2 (two) times daily.     nitroGLYCERIN  (NITROSTAT ) 0.4 MG SL tablet Place 1 tablet (0.4 mg total) under the tongue every 5 (five) minutes as needed for chest pain. 25 tablet 6   pantoprazole  (PROTONIX ) 40 MG tablet Take 1 tablet (40 mg total) by mouth daily. 90 tablet 3   zinc  gluconate 50 MG tablet Take 1 tablet (50 mg total) by mouth daily.     No facility-administered medications  prior to visit.   Allergies[1]   Objective:   There were no vitals filed for this visit. There is no height or weight on file to calculate BMI.   General: Well developed, well nourished. No acute distress. HEENT: Normocephalic, non-traumatic. PERRL, EOMI. Conjunctiva clear. External ears normal. EAC and TMs normal bilaterally. Nose    clear without congestion or rhinorrhea. Mucous membranes moist. Oropharynx clear. Good dentition. Neck: Supple. No lymphadenopathy. No thyromegaly. Lungs: Clear to auscultation bilaterally. No wheezing, rales or rhonchi. CV: RRR without murmurs or rubs. Pulses 2+ bilaterally. Abdomen: Soft, non-tender. Bowel sounds positive, normal pitch and frequency. No hepatosplenomegaly. No rebound or guarding. Back: Straight. No CVA tenderness bilaterally. Extremities: Full ROM. No joint swelling or tenderness. No edema noted. Skin: Warm and dry. No rashes. Neuro: CN II-XII intact. Normal sensation and DTR bilaterally. Psych: Alert and oriented. Normal mood and affect.  Health Maintenance Due  Topic Date Due   COVID-19 Vaccine (1) Never done   HIV Screening  Never done   Hepatitis C Screening  Never done   Pneumococcal Vaccine: 50+ Years (1 of 2 - PCV) Never done   Zoster Vaccines- Shingrix (1 of 2) Never done   Lung Cancer Screening  01/17/2024   Lab Results {Labs (Optional):29002}    Assessment & Plan:   Problem List Items Addressed This Visit       Active Problems   Tobacco abuse   Coronary artery disease   Essential hypertension - Primary   Emphysema lung (HCC)   Atrial fibrillation (HCC)    No follow-ups on file.   Corey CHRISTELLA Simpler, MD  I,Corey Collier,acting as a scribe for Corey CHRISTELLA Simpler, MD.,have documented all relevant documentation on the behalf of Corey CHRISTELLA Simpler, MD.  I, Corey CHRISTELLA Simpler, MD, have reviewed all documentation for this visit. The documentation on 04/16/2024 for the exam, diagnosis, procedures, and orders are all accurate and  complete.     [1] No Known Allergies  "

## 2024-04-16 NOTE — Assessment & Plan Note (Addendum)
 Blood pressure is in good control. Continue amlodipine  10 mg daily. Reported no longer taking atenolol  100 mg daily.

## 2024-04-16 NOTE — Assessment & Plan Note (Signed)
 Corey Collier is scheduled for a cardioversion related to his recent diagnosis of atrial fibrillation. No longer taking atenolol  100 mg daily for rate control. Continue apixaban  5 mg bid for stroke prevention.
